# Patient Record
Sex: Female | Born: 1985 | Race: Black or African American | Hispanic: No | Marital: Married | Smoking: Never smoker
Health system: Southern US, Community
[De-identification: ages and names within clinical notes are randomized; demographics above are authoritative.]

## PROBLEM LIST (undated history)

## (undated) DIAGNOSIS — N83202 Unspecified ovarian cyst, left side: Secondary | ICD-10-CM

## (undated) DIAGNOSIS — O139 Gestational [pregnancy-induced] hypertension without significant proteinuria, unspecified trimester: Secondary | ICD-10-CM

## (undated) DIAGNOSIS — D219 Benign neoplasm of connective and other soft tissue, unspecified: Secondary | ICD-10-CM

## (undated) DIAGNOSIS — T7840XA Allergy, unspecified, initial encounter: Secondary | ICD-10-CM

## (undated) DIAGNOSIS — N83201 Unspecified ovarian cyst, right side: Secondary | ICD-10-CM

## (undated) DIAGNOSIS — L508 Other urticaria: Secondary | ICD-10-CM

## (undated) DIAGNOSIS — J40 Bronchitis, not specified as acute or chronic: Secondary | ICD-10-CM

## (undated) DIAGNOSIS — E559 Vitamin D deficiency, unspecified: Secondary | ICD-10-CM

## (undated) HISTORY — DX: Unspecified ovarian cyst, right side: N83.202

## (undated) HISTORY — DX: Unspecified ovarian cyst, right side: N83.201

## (undated) HISTORY — DX: Allergy, unspecified, initial encounter: T78.40XA

## (undated) HISTORY — DX: Benign neoplasm of connective and other soft tissue, unspecified: D21.9

## (undated) HISTORY — DX: Vitamin D deficiency, unspecified: E55.9

## (undated) HISTORY — PX: IUD REMOVAL: SHX5392

## (undated) HISTORY — DX: Gestational (pregnancy-induced) hypertension without significant proteinuria, unspecified trimester: O13.9

## (undated) HISTORY — DX: Bronchitis, not specified as acute or chronic: J40

## (undated) HISTORY — PX: TONSILLECTOMY: SUR1361

---

## 2008-05-01 ENCOUNTER — Emergency Department (HOSPITAL_COMMUNITY): Admission: EM | Admit: 2008-05-01 | Discharge: 2008-05-01 | Payer: Self-pay | Admitting: Family Medicine

## 2008-10-21 HISTORY — PX: DILATION AND CURETTAGE OF UTERUS: SHX78

## 2009-08-23 ENCOUNTER — Ambulatory Visit: Payer: Self-pay

## 2009-08-24 ENCOUNTER — Ambulatory Visit: Payer: Self-pay

## 2010-06-04 ENCOUNTER — Emergency Department: Payer: Self-pay | Admitting: Unknown Physician Specialty

## 2010-10-24 ENCOUNTER — Observation Stay: Payer: Self-pay

## 2010-10-25 ENCOUNTER — Observation Stay: Payer: Self-pay | Admitting: Obstetrics and Gynecology

## 2010-10-26 ENCOUNTER — Inpatient Hospital Stay: Payer: Self-pay

## 2011-07-18 LAB — POCT URINALYSIS DIP (DEVICE)
Bilirubin Urine: NEGATIVE
Glucose, UA: NEGATIVE
Ketones, ur: NEGATIVE
Operator id: 235561
Protein, ur: 100 — AB

## 2011-07-18 LAB — POCT PREGNANCY, URINE: Operator id: 235561

## 2014-02-13 ENCOUNTER — Inpatient Hospital Stay: Payer: Self-pay

## 2014-02-13 LAB — CBC WITH DIFFERENTIAL/PLATELET
BASOS ABS: 0 10*3/uL (ref 0.0–0.1)
BASOS PCT: 0.5 %
EOS ABS: 0.3 10*3/uL (ref 0.0–0.7)
EOS PCT: 2.9 %
HCT: 39.5 % (ref 35.0–47.0)
HGB: 13.4 g/dL (ref 12.0–16.0)
LYMPHS ABS: 2.2 10*3/uL (ref 1.0–3.6)
Lymphocyte %: 24 %
MCH: 29.7 pg (ref 26.0–34.0)
MCHC: 33.8 g/dL (ref 32.0–36.0)
MCV: 88 fL (ref 80–100)
MONOS PCT: 6.8 %
Monocyte #: 0.6 x10 3/mm (ref 0.2–0.9)
NEUTROS ABS: 6.1 10*3/uL (ref 1.4–6.5)
Neutrophil %: 65.8 %
Platelet: 173 10*3/uL (ref 150–440)
RBC: 4.49 10*6/uL (ref 3.80–5.20)
RDW: 15 % — ABNORMAL HIGH (ref 11.5–14.5)
WBC: 9.3 10*3/uL (ref 3.6–11.0)

## 2014-02-15 LAB — HEMATOCRIT: HCT: 35.3 % (ref 35.0–47.0)

## 2014-08-04 ENCOUNTER — Ambulatory Visit: Payer: Self-pay | Admitting: Surgery

## 2015-02-28 ENCOUNTER — Other Ambulatory Visit
Admission: RE | Admit: 2015-02-28 | Discharge: 2015-02-28 | Disposition: A | Discharge status: Home / Self Care | Source: Ambulatory Visit | Attending: Urgent Care | Admitting: Urgent Care

## 2015-02-28 DIAGNOSIS — R109 Unspecified abdominal pain: Secondary | ICD-10-CM | POA: Insufficient documentation

## 2015-02-28 DIAGNOSIS — K5900 Constipation, unspecified: Secondary | ICD-10-CM | POA: Insufficient documentation

## 2015-02-28 LAB — COMPREHENSIVE METABOLIC PANEL
ALT: 14 U/L (ref 14–54)
AST: 18 U/L (ref 15–41)
Albumin: 4.4 g/dL (ref 3.5–5.0)
Alkaline Phosphatase: 71 U/L (ref 38–126)
Anion gap: 5 (ref 5–15)
BUN: 13 mg/dL (ref 6–20)
CHLORIDE: 106 mmol/L (ref 101–111)
CO2: 26 mmol/L (ref 22–32)
CREATININE: 0.82 mg/dL (ref 0.44–1.00)
Calcium: 8.8 mg/dL — ABNORMAL LOW (ref 8.9–10.3)
Glucose, Bld: 92 mg/dL (ref 65–99)
POTASSIUM: 3.5 mmol/L (ref 3.5–5.1)
SODIUM: 137 mmol/L (ref 135–145)
Total Bilirubin: 0.5 mg/dL (ref 0.3–1.2)
Total Protein: 7.5 g/dL (ref 6.5–8.1)

## 2015-02-28 LAB — CBC WITH DIFFERENTIAL/PLATELET
Basophils Absolute: 0.1 10*3/uL (ref 0–0.1)
Basophils Relative: 1 %
EOS PCT: 2 %
Eosinophils Absolute: 0.2 10*3/uL (ref 0–0.7)
HEMATOCRIT: 38.6 % (ref 35.0–47.0)
HEMOGLOBIN: 12.5 g/dL (ref 12.0–16.0)
LYMPHS PCT: 47 %
Lymphs Abs: 3.4 10*3/uL (ref 1.0–3.6)
MCH: 28.5 pg (ref 26.0–34.0)
MCHC: 32.4 g/dL (ref 32.0–36.0)
MCV: 87.8 fL (ref 80.0–100.0)
MONO ABS: 0.4 10*3/uL (ref 0.2–0.9)
MONOS PCT: 6 %
NEUTROS ABS: 3.2 10*3/uL (ref 1.4–6.5)
Neutrophils Relative %: 44 %
Platelets: 252 10*3/uL (ref 150–440)
RBC: 4.4 MIL/uL (ref 3.80–5.20)
RDW: 13.9 % (ref 11.5–14.5)
WBC: 7.3 10*3/uL (ref 3.6–11.0)

## 2015-02-28 LAB — TSH: TSH: 1.614 u[IU]/mL (ref 0.350–4.500)

## 2015-02-28 LAB — LIPASE, BLOOD: LIPASE: 28 U/L (ref 22–51)

## 2015-03-01 LAB — IGA: IGA: 101 mg/dL (ref 87–352)

## 2015-03-01 LAB — MISC LABCORP TEST (SEND OUT): LABCORP TEST CODE: 4416

## 2015-03-01 LAB — TISSUE TRANSGLUTAMINASE, IGA: Tissue Transglutaminase Ab, IgA: <2 U/mL (ref 0–3)

## 2015-03-29 ENCOUNTER — Encounter: Payer: Self-pay | Admitting: Physical Therapy

## 2015-03-29 ENCOUNTER — Ambulatory Visit: Attending: Urgent Care | Admitting: Physical Therapy

## 2015-03-29 DIAGNOSIS — R279 Unspecified lack of coordination: Secondary | ICD-10-CM

## 2015-03-29 DIAGNOSIS — M629 Disorder of muscle, unspecified: Secondary | ICD-10-CM | POA: Diagnosis present

## 2015-03-30 NOTE — Patient Instructions (Signed)
      You are now ready to begin training the deep core muscles system: diaphragm, transverse abdominis, pelvic floor . These muscles must work together as a team.           The key to these exercises to train the brain to coordinate the timing of these muscles and to have them turn on for long periods of time to hold you upright against gravity (especially important if you are on your feet all day).These muscles are postural muscles and play a role stabilizing your spine and bodyweight. By doing these repetitions slowly and correctly instead of doing crunches, you will achieve a flatter belly without a lower pooch. You are also placing your spine in a more neutral position and breathing properly which in turn, decreases your risk for problems related to your pelvic floor, abdominal, and low back such as pelvic organ prolapse, hernias, diastasis recti (separation of superficial muscles), disk herniations, spinal fractures. These exercises set a solid foundation for you to later progress to resistance/ strength training with therabands and weights and return to other typical fitness exercises with a stronger deeper core.   Do only Dyn 1-2 this week

## 2015-03-30 NOTE — Therapy (Signed)
[place] MAIN [facility] SERVICES [address] [place], [place], [address] Phone: [phone]   Fax:  [phone]  Physical Therapy Evaluation  Patient Details  Name: Morgan Boyd MRN: [id number] Date of Birth: 1986-02-03 Referring Provider:  Andria Meuse, NP  Encounter Date: 03/29/2015      PT End of Session - 03/30/15 1027    Visit Number 1   Number of Visits 12   Date for PT Re-Evaluation 06/21/15   PT Start Time 1600   PT Stop Time 1517   PT Time Calculation (min) 75 min   Activity Tolerance Patient tolerated treatment well   Behavior During Therapy [facility] for tasks assessed/performed      Past Medical History  Diagnosis Date  . Allergy   . Bronchitis     Past Surgical History  Procedure Laterality Date  . Dilation and curettage of uterus  2010  . Tonsillectomy      There were no vitals filed for this visit.  Visit Diagnosis:  Fascial defect - Plan: PT plan of care cert/re-cert  Lack of coordination - Plan: PT plan of care cert/re-cert      Subjective Assessment - 03/29/15 1618    Subjective Pt noticed abdominal weakness immediately after returning home after delivery 4/ 2015, with tenderness to touch, being able to visually see movement of intestines,  constipation/ diarrhea flunctuatons, and abdominal cramping with forward bending, and urinary leakage with coughing, sneezing, laughing. Pt has difficulty laying on her stomach due to discomfort and also holding and picking up baby with compensatory use of other muscles and back pain increases.  Pt has pain with intercourse (sharp pains during and then aching and throbbing pain afterwards lingering a few hours). Pt also feel  as something is lowered after having sexual intercourse.    Pertinent History Hx of 2 vaginal deliveries, natural tearing with both children (1-2nd degree tears). Pt used to dance Editor, commissioning, jazz), teach dance fitness classes, [employer], running 3 mi 3x  week  prior to pregnancy. Performed bootcamp and weight training (arms 30#), and crunches (25-100 / 2x/week for 8 months)   Patient Stated Goals 1) be able to return to dance and fitness classes 2) return to running 3x/week  3) hold baby without leaning backward  4) elimkinate LBP 5) decreased pain with intercourse   Currently in Pain? Yes   Pain Score 5    Pain Location Back   Pain Orientation Lower   Pain Descriptors / Indicators Aching;Dull   Pain Type Chronic pain   Pain Onset More than a month ago   Pain Frequency Other (Comment)   Aggravating Factors  sitting, lifting             [facility] PT Assessment - 03/30/15 1101    Assessment   Medical Diagnosis constipation, diarrhea   Precautions   Precautions None   Restrictions   Weight Bearing Restrictions No   Prior Function   Level of Independence Independent   Observation/Other Assessments   Observations abdominal straining w/ cue for bowel movement,   Other Surveys  --  Pelvic Girdle Questionnaire 75% (lower % indicate improved f   Coordination   Gross Motor Movements are Fluid and Coordinated Yes   Fine Motor Movements are Fluid and Coordinated --  ALSR with lumbopelvic instability   AROM   Overall AROM  Within functional limits for tasks performed   Strength   Overall Strength Deficits   Overall Strength Comments Hip  Abd 3-/5 L, 3/5 R. 4/5 bilaterally with cue for deep core activation    Palpation   Palpation comment rib flare, diaphragm elevated   Bed Mobility   Bed Mobility Supine to Sit   log roll                   [facility] Adult PT Treatment/Exercise - 03/29/15 1633    Exercises   Exercises Lumbar   Other Exercises  Dynamic Stabilization 1-2   Knee/Hip Exercises: Standing   Other Standing Knee Exercises Postural education: pelvic neutral and deep core activation   Knee/Hip Exercises: Seated   Other Seated Knee Exercises Postural education w/ dee cpre ativation   Manual Therapy   Manual Therapy  Myofascial release  quadriped and plantigrade (abdomen(upper)   Myofascial Release Pre-Tx: 3 fingers above umbilicus, Post-Tx: 2 fingers                PT Education - 03/30/15 1027    Education provided Yes   Education Details HEP, POC, anatomy/physiology, goals, modifications to her exercise routine, answered pt's questions   Person(s) Educated Patient   Methods Explanation;Demonstration;Tactile cues;Verbal cues;Handout   Comprehension Verbalized understanding;Returned demonstration             PT Long Term Goals - 03/30/15 1045    PT LONG TERM GOAL #1   Title Pt will score a decreased % on PGQ from 75% to < 50% in order to improve QOL and return to ADLs.   Time 12   Period Weeks   Status New   PT LONG TERM GOAL #2   Title Pt will report having regular bowel movements with Stool Type 3-4 for 3-5x/ week for 1 week in order to demo improved bowel function.   Time 12   Period Weeks   Status New   PT LONG TERM GOAL #3   Title Pt will report no lingering pain after intercourse in order to demo improved pelvic floor function and improve QOL.    Time 12   Period Weeks   Status New   PT LONG TERM GOAL #4   Title Pt will demo decreased abdominal separation of < 2 fingers above and below umbilicus and demo proper activation of deep core mm with Dynamic Stabilization exercises 1-4 without cuing in order to return fitness exercises with minimized risk for injuries.    Time 12   Period Weeks   Status New               Plan - 03/30/15 1029    Clinical Impression Statement Pt is a 29 yo post-partum (29 yo) female whose S & Sx include poor fascial tensigrity over abdomen with abdominal separation, poor posture and body mechanics, decreased deep core strength and hip strength, and  poor deep core coordination w/ bowel movement cues. These deficits along with her Hx of performing exercises that poses strain on the pelvic floor/abdominal mm and multiple pregnancies have  likely contributed to her Sx of LBP, bowel, urinary, and sexual dysfunctions. These deficits limit her from  lifting her baby, having sex and  regular bowel movements in addition to having  no  urinary leakage.    Pt will benefit from skilled therapeutic intervention in order to improve on the following deficits Abnormal gait;Decreased coordination;Decreased balance;Decreased activity tolerance;Decreased endurance;Postural dysfunction;Improper body mechanics;Decreased range of motion;Decreased mobility;Other (comment);Pain;Obesity;Hypomobility;Decreased safety awareness   Rehab Potential Good   Clinical Impairments Affecting Rehab Potential Hx of pregnancies, performing excessive crunches, running  after pregnancy   PT Frequency 1x / week   PT Duration 12 weeks   PT Treatment/Interventions ADLs/Self Care Home Management;Aquatic Therapy;Neuromuscular re-education;Gait training;Patient/family education;Moist Heat;Cryotherapy;Functional mobility training;Therapeutic activities;Balance training;Therapeutic exercise;Manual techniques;Energy conservation;Dry needling   PT Next Visit Plan internal pelvic floor assessment    Consulted and Agree with Plan of Care Patient         Problem List There are no active problems to display for this patient.   Jerl Mina ,PT, DPT, E-RYT  03/30/2015, 11:01 AM  [place] MAIN [facility] SERVICES [address] [place], [place], [address] Phone: [phone]   Fax:  [phone]

## 2015-04-03 ENCOUNTER — Ambulatory Visit: Admitting: Physical Therapy

## 2015-04-03 DIAGNOSIS — M629 Disorder of muscle, unspecified: Secondary | ICD-10-CM | POA: Diagnosis not present

## 2015-04-03 DIAGNOSIS — R279 Unspecified lack of coordination: Secondary | ICD-10-CM

## 2015-04-03 NOTE — Patient Instructions (Signed)
PELVIC FLOOR / KEGEL EXERCISES   Pelvic floor/ Kegel exercises are used to strengthen the muscles in the base of your pelvis that are responsible for supporting your pelvic organs and preventing urine/feces leakage. Based on your therapist's recommendations, they can be performed while standing, sitting, or lying down. Imagine pelvic floor area as a diamond with pelvic landmarks: top =pubic bone, bottom tip=tailbone, sides=sitting bones (ischial tuberosities).    Make yourself aware of this muscle group by using these cues while coordinating your breath:  Inhale, feel pelvic floor diamond area lower like hammock towards your feet and ribcage/belly expanding. Pause. Let the exhale naturally and feel your belly sink, abdominal muscles hugging in around you and you may notice the pelvic diamond draws upward towards your head forming a umbrella shape. Give a squeeze during the exhalation like you are stopping the flow of urine. If you are squeezing the buttock muscles, try to give 50% less effort.   Common Errors:  Breath holding: If you are holding your breath, you may be bearing down against your bladder instead of pulling it up. If you belly bulges up while you are squeezing, you are holding your breath. Be sure to breathe gently in and out while exercising. Counting out loud may help you avoid holding your breath.  Accessory muscle use: You should not see or feel other muscle movement when performing pelvic floor exercises. When done properly, no one can tell that you are performing the exercises. Keep the buttocks, belly and inner thighs relaxed.  Overdoing it: Your muscles can fatigue and stop working for you if you over-exercise. You may actually leak more or feel soreness at the lower abdomen or rectum.  YOUR HOME EXERCISE PROGRAM  LONG HOLDS: Position: on back w/ pillow propped under hips  Inhale and then exhale. Then squeeze the muscle and count aloud for 10 seconds. Rest with three long  breaths. (Be sure to let belly sink in with exhales and not push outward)  Perform 4 repetitions, 3 times/day  SHORT HOLDS: Position: on back, sitting   Inhale and then exhale. Then squeeze the muscle.  (Be sure to let belly sink in with exhales and not push outward)  Perform after each rep of dynamic stabilization Level 1, steated                       DECREASE DOWNWARD PRESSURE ON  YOUR PELVIC FLOOR, ABDOMINAL, LOW BACK MUSCLES       PRESERVE YOUR PELVIC HEALTH LONG-TERM   ** SQUEEZE pelvic floor BEFORE YOUR SNEEZE, COUGH, LAUGH   ** EXHALE BEFORE YOU RISE AGAINST GRAVITY (lifting, sit to stand, from squat to stand)  ___WIDER FEET< USE LEG STENGTH AND NO FLIP FLOPS   ** LOG ROLL OUT OF BED INSTEAD OF CRUNCH/SIT-UP

## 2015-04-03 NOTE — Therapy (Signed)
[place] MAIN [facility] SERVICES [address] [place], [place], [address] Phone: [phone]   Fax:  [phone]  Physical Therapy Treatment  Patient Details  Name: Morgan Boyd MRN: [id number] Date of Birth: 10-Dec-1985 Referring Provider:  Andria Meuse, NP  Encounter Date: 04/03/2015      PT End of Session - 04/03/15 2200    Visit Number 2   Number of Visits 12   Date for PT Re-Evaluation 06/21/15   PT Start Time 7824   PT Stop Time 1615   PT Time Calculation (min) 60 min   Activity Tolerance Patient tolerated treatment well   Behavior During Therapy [facility] for tasks assessed/performed      Past Medical History  Diagnosis Date  . Allergy   . Bronchitis     Past Surgical History  Procedure Laterality Date  . Dilation and curettage of uterus  2010  . Tonsillectomy      There were no vitals filed for this visit.  Visit Diagnosis:  Fascial defect  Lack of coordination      Subjective Assessment - 04/03/15 1518    Subjective Pt reported she has been lifting items because her family is moving to a new home. Pt has been practicing proper sitting posture at work.    Pertinent History Hx of 2 vaginal deliveries, natural tearing with both children (1-2nd degree tears). Pt used to dance Editor, commissioning, jazz), teach dance fitness classes, [employer], running 3 mi 3x week  prior to pregnancy. Performed bootcamp and weight training (arms 30#), and crunches (25-100 / 2x/week for 8 months)   Patient Stated Goals 1) be able to return to dance and fitness classes 2) return to running 3x/week  3) hold baby without leaning backward  4) elimkinate LBP 5) decreased pain with intercourse   Currently in Pain? Yes   Pain Score 5    Pain Location Back   Pain Descriptors / Indicators Aching;Dull   Pain Onset More than a month ago   Pain Frequency Other (Comment)   Aggravating Factors  sitting for long periods and picking up children                       Pelvic Floor Special Questions - 04/03/15 1637    Pelvic Floor Internal Exam consented verbally and no contraindications    Exam Type Vaginal   Palpation bladder positioned dorsally. Pillow under hips decreased palpation to Obt Int L   tenderness/ increased tensions bil pubococcygeus/ obt int.   Strength fair squeeze, definite lift  after manual Tx, increased from 2/5 (posterior> ant) to 3/5   Strength # of reps 4  L decreased activation > R   Strength # of seconds 10           [facility] Adult PT Treatment/Exercise - 04/03/15 1631    Bed Mobility   Bed Mobility Sit to Supine   log roll   Sit to Supine Other (comment)  cues: sit > sidelying> supine, minimize abd strain   Manual Therapy   Manual Therapy Soft tissue mobilization;Myofascial release   Manual therapy comments abdominal massage   guided pt to perform self massage   Myofascial Release Pre-Tx: 2.5 fingers above umbilicus, Post-Tx: 2 fingers  below sternum: 3>2 fingers post-tactile cues for ribcage dep                PT Education - 04/03/15 2159    Education provided Yes  Education Details HEP   Person(s) Educated Patient   Methods Explanation;Demonstration;Tactile cues;Verbal cues;Handout   Comprehension Verbalized understanding;Returned demonstration             PT Long Term Goals - 03/30/15 1045    PT LONG TERM GOAL #1   Title Pt will score a decreased % on PGQ from 75% to < 50% in order to improve QOL and return to ADLs.   Time 12   Period Weeks   Status New   PT LONG TERM GOAL #2   Title Pt will report having regular bowel movements with Stool Type 3-4 for 3-5x/ week for 1 week in order to demo improved bowel function.   Time 12   Period Weeks   Status New   PT LONG TERM GOAL #3   Title Pt will report no lingering pain after intercourse in order to demo improved pelvic floor function and improve QOL.    Time 12   Period Weeks   Status New   PT LONG TERM  GOAL #4   Title Pt will demo decreased abdominal separation of < 2 fingers above and below umbilicus and demo proper activation of deep core mm with Dynamic Stabilization exercises 1-4 without cuing in order to return fitness exercises with minimized risk for injuries.    Time 12   Period Weeks   Status New               Plan - 04/03/15 2200    Clinical Impression Statement Pt continued to show improvement of abdominal closure post-manual Tx with addition of neuromuscular cuing for ribcage depression. Initiated pelvic floor strenghtening as her internal assessment showed weakness w/ slight dorsal descent of bladder within introitus and increased mm tensions. Positioning and manual Tx faciliated increased pelvic floor contraction. Also initiated abdominal massage.  Pt will continue to benefit from skilled PT.     Pt will benefit from skilled therapeutic intervention in order to improve on the following deficits Abnormal gait;Decreased coordination;Decreased balance;Decreased activity tolerance;Decreased endurance;Postural dysfunction;Improper body mechanics;Decreased range of motion;Decreased mobility;Other (comment);Pain;Obesity;Hypomobility;Decreased safety awareness   Rehab Potential Good   Clinical Impairments Affecting Rehab Potential Hx of pregnancies, performing excessive crunches, running after pregnancy   PT Frequency 1x / week   PT Duration 12 weeks   PT Treatment/Interventions ADLs/Self Care Home Management;Aquatic Therapy;Neuromuscular re-education;Gait training;Patient/family education;Moist Heat;Cryotherapy;Functional mobility training;Therapeutic activities;Balance training;Therapeutic exercise;Manual techniques;Energy conservation;Dry needling   Consulted and Agree with Plan of Care Patient        Problem List There are no active problems to display for this patient.   Jerl Mina ,PT, DPT, E-RYT  04/03/2015, 10:04 PM  [place] MAIN [facility] SERVICES [address] [place], [place], [address] Phone: [phone]   Fax:  [phone]

## 2015-04-10 ENCOUNTER — Ambulatory Visit: Admitting: Physical Therapy

## 2015-04-10 DIAGNOSIS — M629 Disorder of muscle, unspecified: Secondary | ICD-10-CM

## 2015-04-10 DIAGNOSIS — R279 Unspecified lack of coordination: Secondary | ICD-10-CM

## 2015-04-10 NOTE — Therapy (Signed)
[place] MAIN [facility] SERVICES [address] [place], [place], [address] Phone: [phone]   Fax:  [phone]  Physical Therapy Treatment  Patient Details  Name: Morgan Boyd MRN: [id number] Date of Birth: January 19, 1986 Referring Provider:  Andria Meuse, NP  Encounter Date: 04/10/2015      PT End of Session - 04/10/15 1558    Visit Number 3   Number of Visits 12   Date for PT Re-Evaluation 06/21/15   PT Start Time 9485   PT Stop Time 1559   PT Time Calculation (min) 52 min   Activity Tolerance Patient tolerated treatment well   Behavior During Therapy [facility] for tasks assessed/performed      Past Medical History  Diagnosis Date  . Allergy   . Bronchitis     Past Surgical History  Procedure Laterality Date  . Dilation and curettage of uterus  2010  . Tonsillectomy      There were no vitals filed for this visit.  Visit Diagnosis:  Fascial defect  Lack of coordination      Subjective Assessment - 04/10/15 1530    Subjective Pt reports her constipation/diarrhea Sx have improved 50%. Pt reported she is more conscious with lifting on exhale but she still feel pain in her lower back when holding her son (26#). Pt found SIJ belt was helpful during lifting and moving boxes. This morning she woke up with 8/10 due to an awkward sleeping position but pain has decreased to 4/10 as the today progressed.   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 Pt has had an IUD inserted since her last session and had not significant complaints.     Patient is accompained by: --  Pt was not accompanied by family member at last session 04/03/15    Pertinent History Hx of 2 vaginal deliveries, natural tearing with both children (1-2nd degree tears). Pt used to dance Editor, commissioning,  jazz), teach dance fitness classes, [employer], running 3 mi 3x week  prior to pregnancy. Performed bootcamp and weight training (arms 30#), and crunches (25-100 / 2x/week for 8 months)   Patient Stated Goals 1) be able to return to dance and fitness classes 2) return to running 3x/week  3) hold baby without leaning backward  4) eliminate LBP 5) decreased    Currently in Pain? Yes   Pain Score 4    Pain Location Back   Pain Orientation Lower   Pain Type Chronic pain   Pain Onset More than a month ago   Pain Frequency Constant                         [facility] Adult PT Treatment/Exercise - 04/10/15 1518    Posture/Postural Control   Posture Comments Dynamic Stabilization 3-4 (5 reps)    Exercises   Exercises Ankle   Lumbar Exercises: Standing   Functional Squats 10 reps  neuro-reedu: tranverse arch  and pelivc neutral engaged   Lifting 10 reps  with squats 10# dumbbell    Manual Therapy   Manual Therapy Joint mobilization   Manual therapy comments Noted decreased mobility at T4   increased mm tension midback mm bilaterally   Joint Mobilization Grade IIII PA on T4   increased mobility at T4 and decreased mm tension post_Tx   Ankle  Exercises: Standing   Other Standing Ankle Exercises double heel raises w/ neuro reedu on slow descend, transverse arch engaged  pelvic neural position maintained                 PT Education - 04/10/15 1557    Education provided Yes   Education Details HEP   Person(s) Educated Patient   Methods Explanation;Demonstration;Tactile cues;Verbal cues;Handout   Comprehension Verbalized understanding;Returned demonstration             PT Long Term Goals - 04/10/15 1535    PT LONG TERM GOAL #1   Title Pt will score a decreased % on PGQ from 75% to < 50% in order to improve QOL and return to ADLs.   Time 12   Period Weeks   Status New   PT LONG TERM GOAL #2   Title Pt will report having regular bowel movements with Stool Type  3-4 for 3-5x/ week for 1 week in order to demo improved bowel function.   Time 12   Period Weeks   Status Achieved   PT LONG TERM GOAL #3   Title Pt will report no lingering pain after intercourse in order to demo improved pelvic floor function and improve QOL.    Time 12   Period Weeks   Status On-going   PT LONG TERM GOAL #4   Title Pt will demo decreased abdominal separation of < 2 fingers above and below umbilicus and demo proper activation of deep core mm with Dynamic Stabilization exercises 1-4 without cuing in order to return fitness exercises with minimized risk for injuries.    Time 12   Period Weeks   Status Achieved               Plan - 04/10/15 1601    Clinical Impression Statement Pt has achieved 2 goals with increased abdominal closure and significantly improved constipation/diarrhea Sx and abdominal pain. Pt showed good carry over with proper sitting posture but required minor cuing for pelvic neutral in standing. Pt continues to show deficits in lower kinetic chain with collapsed arches and hypomobile upper thoracic spine segments. Post-Tx: pt showed increased mobility in T-spine, increased awareness to evert feet in functional exercises to buiild up her arches. Will reassess intravaginally to address dyspaeunia goals at next session.  Progressed to dynamic stabilization 3-4 today.    Pt will benefit from skilled therapeutic intervention in order to improve on the following deficits Abnormal gait;Decreased coordination;Decreased balance;Decreased activity tolerance;Decreased endurance;Postural dysfunction;Improper body mechanics;Decreased range of motion;Decreased mobility;Other (comment);Pain;Obesity;Hypomobility;Decreased safety awareness   Rehab Potential Good   Clinical Impairments Affecting Rehab Potential Hx of pregnancies, performing excessive crunches, running after pregnancy   PT Frequency 1x / week   PT Duration 12 weeks   PT Treatment/Interventions ADLs/Self  Care Home Management;Aquatic Therapy;Neuromuscular re-education;Gait training;Patient/family education;Moist Heat;Cryotherapy;Functional mobility training;Therapeutic activities;Balance training;Therapeutic exercise;Manual techniques;Energy conservation;Dry needling   PT Next Visit Plan internal pelvic floor reassessment    Consulted and Agree with Plan of Care Patient        Problem List There are no active problems to display for this patient.   Jerl Mina ,PT, DPT, E-RYT  04/10/2015, 4:12 PM  [place] MAIN [facility] SERVICES [address] [place], [place], [address] Phone: [phone]   Fax:  [phone]

## 2015-04-10 NOTE — Patient Instructions (Addendum)
1. Double Heel Raises 2. Squat and Squat w/ 10 # dumbbell  3. chair pose with attention not collapsing longitidunal arch (4 corners of the feet) 10 reps   4. Dynamic Stabilization 3-4, 20-30 reps/2x day 5. Progress pelvic floor to 4 reps (pillow propped)     6. Standing posture from the feet arch lifted)  7. Get shoes with proper sole support  (minimal time in flip flops)

## 2015-04-17 ENCOUNTER — Encounter: Admitting: Physical Therapy

## 2015-04-20 ENCOUNTER — Encounter: Payer: Self-pay | Admitting: Surgery

## 2015-04-20 ENCOUNTER — Ambulatory Visit (INDEPENDENT_AMBULATORY_CARE_PROVIDER_SITE_OTHER): Admitting: Surgery

## 2015-04-20 ENCOUNTER — Ambulatory Visit: Admitting: Physical Therapy

## 2015-04-20 VITALS — BP 119/82 | HR 68 | Temp 98.3°F | Ht 63.0 in | Wt 183.0 lb

## 2015-04-20 DIAGNOSIS — M629 Disorder of muscle, unspecified: Secondary | ICD-10-CM | POA: Diagnosis not present

## 2015-04-20 DIAGNOSIS — R1013 Epigastric pain: Secondary | ICD-10-CM | POA: Diagnosis not present

## 2015-04-20 DIAGNOSIS — R279 Unspecified lack of coordination: Secondary | ICD-10-CM

## 2015-04-20 NOTE — Progress Notes (Signed)
Surgery Progress note  S: Doing well.  Tolerating diet.  No abdominal pain.  Undergoing pelvic floor PT Blood pressure 119/82, pulse 68, temperature 98.3 F (36.8 C), temperature source Oral, height 5\' 3"  (1.6 m), weight 183 lb (83.008 kg). GEN: NAD/A&Ox3 ABD: soft, min tender, nondistended  A/P 29 yo admit with gallstones, diarrhea/constipation.  Seen GI and suggested diet change and pelvic floor PT.  Doing better from all aspects.  No indication for surgery.  F/u prn.

## 2015-04-20 NOTE — Therapy (Signed)
[place] MAIN [facility] SERVICES [address] [place], [place], [address] Phone: [phone]   Fax:  [phone]  Physical Therapy Treatment  Patient Details  Name: Morgan Boyd MRN: [id number] Date of Birth: 06/25/1986 Referring Provider:  Andria Meuse, NP  Encounter Date: 04/20/2015      PT End of Session - 04/20/15 1830    Visit Number 4   Number of Visits 12   Date for PT Re-Evaluation 06/21/15   Activity Tolerance Patient tolerated treatment well   Behavior During Therapy [facility] for tasks assessed/performed      Past Medical History  Diagnosis Date  . Allergy   . Bronchitis     Past Surgical History  Procedure Laterality Date  . Dilation and curettage of uterus  2010  . Tonsillectomy      There were no vitals filed for this visit.  Visit Diagnosis:  Fascial defect  Lack of coordination      Subjective Assessment - 04/20/15 1531    Subjective Pt reported she feels like she is getting stronger.  Pt reported her constipation/ diarhea Sx are the same. Pt has become more conscious with picking up son and moving items with exhalation and has noticed a difference in decreasing her back pain .  Pt has not been doing her abdominal massage.     Patient is accompained by: --  Pt was not accompanied by family member at last session 04/03/15    Pertinent History Hx of 2 vaginal deliveries, natural tearing with both children (1-2nd degree tears). Pt used to dance Editor, commissioning, jazz), teach dance fitness classes, [employer], running 3 mi 3x week  prior to pregnancy. Performed bootcamp and weight training (arms 30#), and crunches (25-100 / 2x/week for 8 months)   Patient Stated Goals 1) be able to return to dance and fitness classes 2) return to running 3x/week  3) hold baby without leaning backward  4) eliminate LBP 5) decreased    Currently in Pain? Yes   Pain Score 0-No pain   Pain Onset More than a month ago                       Pelvic Floor Special Questions - 04/20/15 0001    Diastasis Recti 1.5 below sternum, and above umbilicus, closure below umbilicus  compared to 3 fingers width at eval   Strength good squeeze, good lift, able to hold agaisnt strong resistance  without pillow           [facility] Adult PT Treatment/Exercise - 04/20/15 1538    Exercises   Exercises --   Other Exercises  ankle pumps SLS, crab crawling, bridging, (ex with baby),  seated clams, monstrer walking, triceps, birddog (at work) 10 reps, pelvic floor increase reps to 5-6 reps 3x/day                PT Education - 04/20/15 [place]    Education provided Yes   Education Details HEP   Person(s) Educated Patient   Methods Explanation;Demonstration;Tactile cues;Verbal cues;Handout   Comprehension Verbalized understanding;Returned demonstration             PT Long Term Goals - 04/20/15 1834    PT LONG TERM GOAL #1   Title Pt will score a decreased % on PGQ from 75% to < 50% in order to improve QOL and return to ADLs.   Time 12   Period Weeks   Status On-going  PT LONG TERM GOAL #2   Title Pt will report having regular bowel movements with Stool Type 3-4 for 3-5x/ week for 1 week in order to demo improved bowel function.   Time 12   Period Weeks   Status Achieved   PT LONG TERM GOAL #3   Title Pt will report no lingering pain after intercourse in order to demo improved pelvic floor function and improve QOL.    Time 12   Period Weeks   Status On-going   PT LONG TERM GOAL #4   Title Pt will demo decreased abdominal separation of < 2 fingers above and below umbilicus and demo proper activation of deep core mm with Dynamic Stabilization exercises 1-4 without cuing in order to return fitness exercises with minimized risk for injuries.    Time 12   Period Weeks   Status Achieved   PT LONG TERM GOAL #5   Title Pt will demo pelvic floor contraction 01/28/09 in order progress to running.     Time 12   Period Weeks   Status New               Plan - 04/20/15 1830    Clinical Impression Statement Pt continues to show good carry over with neuro-redu (feet and lifting mechanics). Pt presented to session without back pain and demo'd 1.5 fingers width below sternum and above umbilicus. Pt also demo'd  increased pelvic floor strength and increased fascial tensigrity as demo'd normal position of bladder and was able to elicit circumferential contraction with lift without pillow under hips.  Pt is progressing to higher functioning exercises to help pt return to running and remaining goals.    Pt will benefit from skilled therapeutic intervention in order to improve on the following deficits Abnormal gait;Decreased coordination;Decreased balance;Decreased activity tolerance;Decreased endurance;Postural dysfunction;Improper body mechanics;Decreased range of motion;Decreased mobility;Other (comment);Pain;Obesity;Hypomobility;Decreased safety awareness   Rehab Potential Good   Clinical Impairments Affecting Rehab Potential Hx of pregnancies, performing excessive crunches, running after pregnancy   PT Frequency 1x / week   PT Duration 12 weeks   PT Treatment/Interventions ADLs/Self Care Home Management;Aquatic Therapy;Neuromuscular re-education;Gait training;Patient/family education;Moist Heat;Cryotherapy;Functional mobility training;Therapeutic activities;Balance training;Therapeutic exercise;Manual techniques;Energy conservation;Dry needling   PT Next Visit Plan running gait assessment, resistive band for thoracolumbar    Consulted and Agree with Plan of Care Patient        Problem List There are no active problems to display for this patient.   Jerl Mina ,PT, DPT, E-RYT  04/20/2015, 6:36 PM  [place] MAIN [facility] SERVICES [address] [place], [place], [address] Phone: [phone]   Fax:  [phone]

## 2015-04-20 NOTE — Patient Instructions (Addendum)
At work:  Seated clams w/ red band 10x/ 3 set/ days  Sidestepping with squats ( ribcage over pelvic)  A hallway L /R   Monster walking hallway 2 trips hallway   Triceps dips on chair (feet hipwidth apart)    At home:  Belly massage  Playing with Cristie Hem:      Constance Haw 10 x    Birddog  (all fours, opp arm and leg extended) 10x     Crab walking (press ballmounds of hands down)

## 2015-04-20 NOTE — Patient Instructions (Signed)
Give [place] a call if you need [place].

## 2015-04-25 ENCOUNTER — Ambulatory Visit: Attending: Urgent Care | Admitting: Physical Therapy

## 2015-04-25 DIAGNOSIS — M629 Disorder of muscle, unspecified: Secondary | ICD-10-CM | POA: Diagnosis present

## 2015-04-25 DIAGNOSIS — K5900 Constipation, unspecified: Secondary | ICD-10-CM | POA: Diagnosis not present

## 2015-04-25 DIAGNOSIS — R197 Diarrhea, unspecified: Secondary | ICD-10-CM | POA: Diagnosis not present

## 2015-04-25 DIAGNOSIS — R279 Unspecified lack of coordination: Secondary | ICD-10-CM

## 2015-04-26 NOTE — Therapy (Signed)
[place] MAIN [facility] SERVICES [address] [place], [place], [address] Phone: [phone]   Fax:  [phone]  Physical Therapy Treatment  Patient Details  Name: Morgan Boyd MRN: [id number] Date of Birth: 25-Dec-1985 Referring Provider:  Andria Meuse, NP  Encounter Date: 04/25/2015      PT End of Session - 04/26/15 0929    Visit Number 5   Number of Visits 12   Date for PT Re-Evaluation 06/21/15   PT Start Time 6962   PT Stop Time 1710   PT Time Calculation (min) 60 min   Activity Tolerance Patient tolerated treatment well   Behavior During Therapy [facility] for tasks assessed/performed      Past Medical History  Diagnosis Date  . Allergy   . Bronchitis     Past Surgical History  Procedure Laterality Date  . Dilation and curettage of uterus  2010  . Tonsillectomy      There were no vitals filed for this visit.  Visit Diagnosis:  Fascial defect  Lack of coordination      Subjective Assessment - 04/25/15 1611    Subjective Pt continues to not have back pain. Pt feels a abdominal cramp and then will go to the toilet and have no difficulty w/ bowel elimination. Pt has daily bowel movement.    Patient is accompained by:    Pertinent History Hx of 2 vaginal deliveries, natural tearing with both children (1-2nd degree tears). Pt used to dance Editor, commissioning, jazz), teach dance fitness classes, [employer], running 3 mi 3x week  prior to pregnancy. Performed bootcamp and weight training (arms 30#), and crunches (25-100 / 2x/week for 8 months)   Patient Stated Goals 1) be able to return to dance and fitness classes 2) return to running 3x/week  3) hold baby without leaning backward  4) eliminate LBP 5) decreased    Pain Onset More than a month ago                      Pelvic Floor Special Questions - 04/26/15 9528    Pelvic Floor Internal Exam consented verbally and no contraindications    Exam Type Vaginal   Palpation  bladder position normal, pillow not needed for contractions  no tensions/tenderness bil pubococcygeus/ obt int.   Strength good squeeze, good lift, able to hold agaisnt strong resistance   Strength # of reps 9   Strength # of seconds 10           [facility] Adult PT Treatment/Exercise - 04/26/15 0913    Transfers   Transfers --  neuro-re edu w/sexual intercourse positions, modification   Exercises   Exercises Lumbar   Other Exercises  D1Ext, D2 flex,bicep curls SLS, lat pull down    with Green band  10 reps   Lumbar Exercises: Aerobic   Tread Mill neuro-redu: deep core activation, forward COM, land lightly 9' without report of leaking  pt oringinally showed  heel striking                     PT Long Term Goals - 04/25/15 1614    PT LONG TERM GOAL #1   Title Pt will score a decreased % on PGQ from 75% to < 50% in order to improve QOL and return to ADLs.   Time 12   Period Weeks   Status On-going   PT LONG TERM GOAL #2   Title Pt will report  having regular bowel movements with Stool Type 3-4 for 3-5x/ week for 1 week in order to demo improved bowel function.   Time 12   Period Weeks   Status Achieved   PT LONG TERM GOAL #3   Title Pt will report no lingering pain after intercourse in order to demo improved pelvic floor function and improve QOL.    Time 12   Period Weeks   Status On-going   PT LONG TERM GOAL #4   Title Pt will demo decreased abdominal separation of < 2 fingers above and below umbilicus and demo proper activation of deep core mm with Dynamic Stabilization exercises 1-4 without cuing in order to return fitness exercises with minimized risk for injuries.    Time 12   Period Weeks   Status Achieved   PT LONG TERM GOAL #5   Title Pt will demo pelvic floor contraction 01/28/09 in order progress to running.    Time 12   Period Weeks   Status Partially Met               Plan - 04/26/15 0925    Clinical Impression Statement Pt progressed to  treadmill walking 5', jogging 5' without report of leakage. Pt required neuro-reedu for optimal deep core activation and gait training. Progressed HEP w/ green band PNF patterns and SLS to challlenge postural stability.  Pt continues to required skilled PT to continue addressing her goals.    Pt will benefit from skilled therapeutic intervention in order to improve on the following deficits Abnormal gait;Decreased coordination;Decreased balance;Decreased activity tolerance;Decreased endurance;Postural dysfunction;Improper body mechanics;Decreased range of motion;Decreased mobility;Other (comment);Pain;Obesity;Hypomobility;Decreased safety awareness   Rehab Potential Good   Clinical Impairments Affecting Rehab Potential Hx of pregnancies, performing excessive crunches, running after pregnancy   PT Frequency 1x / week   PT Duration 12 weeks   PT Treatment/Interventions ADLs/Self Care Home Management;Aquatic Therapy;Neuromuscular re-education;Gait training;Patient/family education;Moist Heat;Cryotherapy;Functional mobility training;Therapeutic activities;Balance training;Therapeutic exercise;Manual techniques;Energy conservation;Dry needling   PT Next Visit Plan running gait assessment, resistive band for thoracolumbar    Consulted and Agree with Plan of Care Patient        Problem List There are no active problems to display for this patient.   Jerl Mina ,PT, DPT, E-RYT  04/26/2015, 9:34 AM  [place] MAIN [facility] SERVICES [address] [place], [place], [address] Phone: [phone]   Fax:  [phone]

## 2015-05-01 ENCOUNTER — Ambulatory Visit: Admitting: Physical Therapy

## 2015-05-01 DIAGNOSIS — M629 Disorder of muscle, unspecified: Secondary | ICD-10-CM

## 2015-05-01 DIAGNOSIS — R279 Unspecified lack of coordination: Secondary | ICD-10-CM | POA: Insufficient documentation

## 2015-05-02 NOTE — Therapy (Signed)
[place] MAIN [facility] SERVICES [address] [place], [place], [address] Phone: [phone]   Fax:  [phone]  Physical Therapy Treatment  Patient Details  Name: Morgan Boyd MRN: [id number] Date of Birth: 1986/03/17 Referring Provider:  Andria Meuse, NP  Encounter Date: 05/01/2015    Past Medical History  Diagnosis Date  . Allergy   . Bronchitis     Past Surgical History  Procedure Laterality Date  . Dilation and curettage of uterus  2010  . Tonsillectomy      There were no vitals filed for this visit.  Visit Diagnosis:  Fascial defect  Lack of coordination      Subjective Assessment - 05/01/15 1515    Subjective Pt reported  she was able to jog for .5 mi but felt a burning sensation in her pelvic area post-jog as she had been holding her pelvic floor mm tight during jog to prevent leakage. She has been getting her HEP at work. Pt continues to do her abdominal massage and is paying attention to lifting and body mechanics with household activities and lifting her son. Pt has had to perform more lifting than usual like taking out the trash and moving furniture this week as her husband is out of town until the end of this month. Pt's back pain is 3/10 as a result of these increased tasks. Pt stated she noticed her belly looking better but continues to feel frustrated (appeared tearful) because she is used to being fit prior to pregnancy.     Patient is accompained by: --   Pertinent History Hx of 2 vaginal deliveries, natural tearing with both children (1-2nd degree tears). Pt used to dance Editor, commissioning, jazz), teach dance fitness classes, [employer], running 3 mi 3x week  prior to pregnancy. Performed bootcamp and weight training (arms 30#), and crunches (25-100 / 2x/week for 8 months)   Patient Stated Goals 1) be able to return to dance and fitness classes 2) return to running 3x/week  3) hold baby without leaning backward  4) eliminate  LBP 5) decreased    Currently in Pain? Yes   Pain Score 3    Pain Location Back   Pain Orientation Lower   Pain Descriptors / Indicators Aching;Dull   Pain Type Chronic pain   Pain Onset More than a month ago            [facility] PT Assessment - 05/02/15 1001    Palpation   Palpation comment improved ribcage depression in all positions/squat/ lifting                     [facility] Adult PT Treatment/Exercise - 05/02/15 1003    Self-Care   Other Self-Care Comments  body scan technique to decrease anxiety over self-image.   guided 2 cycles, emailed audio recording to pt   Therapeutic Activites    ADL's pushing furniture   cued/demo'd low wide squat, weight shifting BLE   Lifting demo'd standing far from object   cued to stand closer to obj prior to lifting   Other Therapeutic Activities demo'd forward flexion w/ t/f son out of carseat/ lifting stroller out of trunk  cued/demo to pt: semi lunge position, WBing BLE     Neuro Re-ed    Neuro Re-ed Details  decrease static onctraction of pelvic floor mm while jogging. focus on breathing,    Exercises   Other Exercises  progressed bridging to unilat LE birdge, demo'd good  lumbopelvic stability  5 reps   Manual Therapy   Manual Therapy Myofascial release  quadriped and plantigrade (abdomen(upper)   Myofascial Release Pre-Tx: 3 fingers above umbilicus, Post-Tx: 2 fingers  applied taping for support                     PT Long Term Goals - 05/02/15 0954    PT LONG TERM GOAL #1   Title Pt will score a decreased % on PGQ from 75% to < 50% in order to improve QOL and return to ADLs.   Time 12   Period Weeks   Status On-going   PT LONG TERM GOAL #2   Title Pt will report having regular bowel movements with Stool Type 3-4 for 3-5x/ week for 1 week in order to demo improved bowel function.   Time 12   Period Weeks   Status Achieved   PT LONG TERM GOAL #3   Title Pt will report no lingering pain after intercourse  in order to demo improved pelvic floor function and improve QOL.    Time 12   Period Weeks   Status On-going   PT LONG TERM GOAL #4   Title Pt will demo decreased abdominal separation of < 2 fingers above and below umbilicus and demo proper activation of deep core mm with Dynamic Stabilization exercises 1-4 without cuing in order to return fitness exercises with minimized risk for injuries.    Time 12   Period Weeks   Status Achieved   PT LONG TERM GOAL #5   Title Pt will demo pelvic floor contraction 01/28/09 in order progress to running.    Time 12   Period Weeks   Status Partially Met               Plan - 05/02/15 2234    Clinical Impression Statement Pt continues to progress well towards her goals with return to running without leakage and slight back pain with increased household activities the past week. Suspect pt's increased lifting with faulty body mechanics over the past week may be a factor in the separation of 3 fingers width above her umbilicus. Despite DRA, pt shows significantly improved propioception of spine and increased postural stability. Pt demo'd correct body mechanics with transferring baby out of car seat / lifting stroller as practiced by her car. Applied biopsychosocial approach to help pt cope with post-partum bodily changes.  Pt will continue to benefit from skilled PT w/ functional strengthening. Pt was asked to bring baby next session along with music and Zumba movements.         Pt will benefit from skilled therapeutic intervention in order to improve on the following deficits Abnormal gait;Decreased coordination;Decreased balance;Decreased activity tolerance;Decreased endurance;Postural dysfunction;Improper body mechanics;Decreased range of motion;Decreased mobility;Other (comment);Pain;Obesity;Hypomobility;Decreased safety awareness;Decreased cognition   Rehab Potential Good   Clinical Impairments Affecting Rehab Potential Hx of pregnancies, performing  excessive crunches, running after pregnancy   PT Frequency 1x / week   PT Duration 12 weeks   PT Treatment/Interventions ADLs/Self Care Home Management;Aquatic Therapy;Neuromuscular re-education;Gait training;Patient/family education;Moist Heat;Cryotherapy;Functional mobility training;Therapeutic activities;Balance training;Therapeutic exercise;Manual techniques;Energy conservation;Dry needling   PT Next Visit Plan    Consulted and Agree with Plan of Care Patient        Problem List There are no active problems to display for this patient.   Jerl Mina ,PT, DPT, E-RYT  05/02/2015, 10:43 PM  [place] MAIN [facility] SERVICES [place]  [place], [place], [address] Phone: [phone]   Fax:  [phone]

## 2015-05-03 ENCOUNTER — Telehealth: Payer: Self-pay | Admitting: Gastroenterology

## 2015-05-03 NOTE — Telephone Encounter (Signed)
Marcella Dubs with Physical Therapy called to say that patient is doing excellent. No abdominal pain, her stools are normal, physical therapy has helped and just thanking [place] for the referral.

## 2015-05-08 ENCOUNTER — Ambulatory Visit: Admitting: Physical Therapy

## 2015-05-08 DIAGNOSIS — M629 Disorder of muscle, unspecified: Secondary | ICD-10-CM | POA: Diagnosis not present

## 2015-05-08 DIAGNOSIS — R279 Unspecified lack of coordination: Secondary | ICD-10-CM

## 2015-05-09 NOTE — Therapy (Signed)
[place] MAIN [facility] SERVICES [address] [place], [place], [address] Phone: [phone]   Fax:  [phone]  Physical Therapy Treatment  Patient Details  Name: Morgan Boyd MRN: [id number] Date of Birth: Sep 10, 1986 Referring Provider:  Andria Meuse, NP  Encounter Date: 05/08/2015      PT End of Session - 05/09/15 0858    Visit Number 6   Number of Visits 12   Date for PT Re-Evaluation 06/21/15   PT Start Time 5361   PT Stop Time 1600   PT Time Calculation (min) 45 min   Activity Tolerance Patient tolerated treatment well   Behavior During Therapy [facility] for tasks assessed/performed      Past Medical History  Diagnosis Date  . Allergy   . Bronchitis     Past Surgical History  Procedure Laterality Date  . Dilation and curettage of uterus  2010  . Tonsillectomy      There were no vitals filed for this visit.  Visit Diagnosis:  Fascial defect  Lack of coordination      Subjective Assessment - 05/08/15 1518    Subjective Pt reported she took a break from her exercises. Pt read the articles PT sent her and underwent her mindset change and not to get discouraged. Pt practiced the body scan which helped her to feel more "sure" of herself. Pt practiced lifting her trash properly and has not had back pain the past week. Pt arrives with her son.     Patient is accompained by: --  son   Pertinent History Hx of 2 vaginal deliveries, natural tearing with both children (1-2nd degree tears). Pt used to dance Editor, commissioning, jazz), teach dance fitness classes, [employer], running 3 mi 3x week  prior to pregnancy. Performed bootcamp and weight training (arms 30#), and crunches (25-100 / 2x/week for 8 months)   Patient Stated Goals 1) be able to return to dance and fitness classes 2) return to running 3x/week  3) hold baby without leaning backward  4) eliminate LBP 5) decreased    Currently in Pain? No/denies   Pain Score 0-No pain   Pain  Onset More than a month ago                         [facility] Adult PT Treatment/Exercise - 05/09/15 0805    Therapeutic Activites    Therapeutic Activities --  PT observed pt t/f son into car w/ proper form   Lifting lifting son with squat, outside BOS w/ side lunge   integrating workout into play w/ son   Other Therapeutic Activities zumba/point moves   cues for not hyperextending knee, wt bearing on ball mounds                PT Education - 05/09/15 0817    Education provided Yes   Education Details HEP, discussed next appt to be 2 weeks out in order for pt to practice return to dance and remaining goal   Person(s) Educated Patient   Methods Explanation;Demonstration;Tactile cues;Verbal cues   Comprehension Verbalized understanding;Returned demonstration             PT Long Term Goals - 05/08/15 1525    PT LONG TERM GOAL #1   Title Pt will score a decreased % on PGQ from 75% to < 50% in order to improve QOL and return to ADLs.   Time 12   Period Weeks  Status On-going   PT LONG TERM GOAL #2   Title Pt will report having regular bowel movements with Stool Type 3-4 for 3-5x/ week for 1 week in order to demo improved bowel function.   Time 12   Period Weeks   Status Achieved   PT LONG TERM GOAL #3   Title Pt will report no lingering pain after intercourse in order to demo improved pelvic floor function and improve QOL.    Time 12   Period Weeks   Status On-going   PT LONG TERM GOAL #4   Title Pt will demo decreased abdominal separation of < 2 fingers above and below umbilicus and demo proper activation of deep core mm with Dynamic Stabilization exercises 1-4 without cuing in order to return fitness exercises with minimized risk for injuries.    Time 12   Period Weeks   Status Achieved   PT LONG TERM GOAL #5   Title Pt will demo pelvic floor contraction 01/28/09 in order progress to running.    Time 12   Period Weeks   Status Achieved    Additional Long Term Goals   Additional Long Term Goals Yes   PT LONG TERM GOAL #6   Title Pt will report no back pain for one week with increased household activities while husband is away.    Time 12   Period Weeks   Status Achieved               Plan - 05/09/15 0914    Clinical Impression Statement Pt is progressing well with report of no back pain despite increased lifting activities with household chores. Pt is progressing well towards her goals and will return in 2 weeks to practicing return to dance/fitness and sexual intercourse.     Pt will benefit from skilled therapeutic intervention in order to improve on the following deficits Abnormal gait;Decreased coordination;Decreased balance;Decreased activity tolerance;Decreased endurance;Postural dysfunction;Improper body mechanics;Decreased range of motion;Decreased mobility;Other (comment);Pain;Obesity;Hypomobility;Decreased safety awareness;Decreased cognition   Rehab Potential Good   Clinical Impairments Affecting Rehab Potential Hx of pregnancies, performing excessive crunches, running after pregnancy   PT Frequency 1x / week   PT Duration 12 weeks   PT Treatment/Interventions ADLs/Self Care Home Management;Aquatic Therapy;Neuromuscular re-education;Gait training;Patient/family education;Moist Heat;Cryotherapy;Functional mobility training;Therapeutic activities;Balance training;Therapeutic exercise;Manual techniques;Energy conservation;Dry needling   PT Next Visit Plan running gait assessment, resistive band for thoracolumbar    Consulted and Agree with Plan of Care Patient        Problem List There are no active problems to display for this patient.   Jerl Mina  ,PT, DPT, E-RYT  05/09/2015, 9:21 AM  [place] MAIN [facility] SERVICES [address] [place], [place], [address] Phone: [phone]   Fax:  [phone]

## 2015-05-15 ENCOUNTER — Encounter: Admitting: Physical Therapy

## 2015-05-22 ENCOUNTER — Ambulatory Visit: Admitting: Physical Therapy

## 2017-03-24 ENCOUNTER — Telehealth: Payer: Self-pay | Admitting: Obstetrics and Gynecology

## 2017-03-24 NOTE — Telephone Encounter (Signed)
Pt aware of amended My Risk results with an ATM VUS now of no clinical significance. Hard copy mailed to pt. Will continue to await amended results on remaining STK11 VUS. Address updated to [place], [place] address since pt has moved away. Questions answered.

## 2019-06-07 ENCOUNTER — Ambulatory Visit: Payer: Self-pay | Admitting: Allergy

## 2019-06-29 ENCOUNTER — Other Ambulatory Visit: Payer: Self-pay

## 2019-06-29 ENCOUNTER — Encounter: Payer: Self-pay | Admitting: Allergy & Immunology

## 2019-06-29 ENCOUNTER — Ambulatory Visit (INDEPENDENT_AMBULATORY_CARE_PROVIDER_SITE_OTHER): Admitting: Allergy & Immunology

## 2019-06-29 VITALS — BP 102/80 | HR 71 | Temp 98.0°F | Resp 18 | Ht 63.0 in | Wt 212.0 lb

## 2019-06-29 DIAGNOSIS — L501 Idiopathic urticaria: Secondary | ICD-10-CM

## 2019-06-29 DIAGNOSIS — L508 Other urticaria: Secondary | ICD-10-CM | POA: Insufficient documentation

## 2019-06-29 MED ORDER — OMALIZUMAB 150 MG ~~LOC~~ SOLR
150.0000 mg | SUBCUTANEOUS | Status: DC
  Administered 2019-06-29: 16:00:00 150 mg via SUBCUTANEOUS

## 2019-06-29 MED ORDER — EPINEPHRINE 0.3 MG/0.3ML IJ SOAJ: 0.3000 mg | Freq: Once | INTRAMUSCULAR | 1 refills | Status: AC

## 2019-06-29 MED ORDER — ALBUTEROL SULFATE HFA 108 (90 BASE) MCG/ACT IN AERS: 2.0000 | INHALATION_SPRAY | Freq: Four times a day (QID) | RESPIRATORY_TRACT | 1 refills | Status: DC | PRN

## 2019-06-29 NOTE — Patient Instructions (Addendum)
1. 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 - We are going to get your records from the outside practice. - Continue with cetirizine twice daily.  - We do not need to do any new tests today. - Consent for Xolair signed. - Tammy will be reaching out to you to discuss more.   2. Return in about 6 months (around 12/27/2019). This can be an in-person, a virtual Webex or a telephone follow up visit.   Please inform us of any Emergency Department visits, hospitalizations, or changes in symptoms. Call us before going to the ED for breathing or allergy symptoms since we might be able to fit you in for a sick visit. Feel free to contact us anytime with any questions, problems, or concerns.  It was a pleasure to meet you today!  Websites that have reliable patient information: 1. American Academy of Asthma, Allergy, and Immunology: www.aaaai.org 2. Food Allergy Research and Education (FARE): foodallergy.org 3. Mothers of Asthmatics: http://www.asthmacommunitynetwork.org 4. American College of Allergy, Asthma, and Immunology: www.acaai.org  "Like" [place] on Facebook and Instagram for our latest updates!      Make sure you are registered to vote! If you have moved or changed any of your contact information, you will need to get this updated before voting!  In some cases, you MAY be able to register to vote online: [email or web address]    Voter ID laws are NOT going into effect for the General Election in November 2020! DO NOT let this stop you from exercising your right to vote!   Absentee voting is the SAFEST way to vote during the coronavirus pandemic!   Download and print an absentee ballot request form at [email or web address] or you can scan the QR code below with your smart phone:      More information on absentee ballots can be found here: [email or web address]

## 2019-06-29 NOTE — Progress Notes (Signed)
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 - We are going to get your records from the outside practice. - Continue with cetirizine twice daily.  - We do not need to do any new tests today. - Consent for Xolair signed. - Tammy will be reaching out to you to discuss more.   2. Return in about 6 months (around 12/27/2019). This can be an in-person, a virtual Webex or a telephone follow up visit.  Subjective:   Morgan Boyd is a 33 y.o. female presenting today for evaluation of No chief complaint on file.   Morgan Boyd has a history of the following: Patient Active Problem List   Diagnosis Date Noted  . 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  Morgan Boyd was referred by Patient, No Pcp Per.     Morgan Boyd is a 33 y.o. female presenting for an evaluation of chronic urticaria. She moved here from [place], [place].   She has a history of hives that have been ongoing since 2008. She was told that she had a pineapple allergy initially. However during this workup it was finally discovered that she autoimmune urticaria. She has been on Xolair for less than one year with marked improvement in her symptoms. She was on multiple antihistamines daily, but when she is on the Xolair, she has no symptoms whatsoever. She was receiving her Xolair via [facility].   She has not had cetirizine since Thursday in case we needed to do testing today. Her last Xolair was in June 2020. She has not had a Xolair since June and she has been forced to increase her cetirizine.   Otherwise, there is no history of other atopic diseases, including asthma, food allergies, drug allergies, environmental allergies,  stinging insect allergies, eczema or contact dermatitis. There is no significant infectious history. Vaccinations are up to date.    Past Medical History: Patient Active Problem List   Diagnosis Date Noted  . Autoimmune urticaria 06/29/2019    Medication List:  Allergies as of 06/29/2019      Reactions   Garlic    Pineapple    Sulfa Antibiotics       Medication List       Accurate as of June 29, 2019 10:33 PM. If you have any questions, ask your nurse or doctor.        STOP taking these medications   multivitamin-prenatal 27-0.8 MG Tabs tablet Stopped by: [name], MD   vitamin B-12 500 MCG tablet Commonly known as: CYANOCOBALAMIN Stopped by: [name], MD     TAKE these medications   albuterol 108 (90 Base) MCG/ACT inhaler Commonly known as: VENTOLIN HFA Inhale 2 puffs into the lungs every 6 (six) hours as needed for wheezing or shortness of breath. Started by: [name], MD   cetirizine 10 MG tablet Commonly known as: ZYRTEC Take 10 mg by mouth daily.   Clobetasol Propionate 0.05 % shampoo Apply topically once a week.   EPINEPHrine 0.3 mg/0.3 mL Soaj injection Commonly known as: EpiPen 2-Pak Inject 0.3 mLs (0.3 mg total) into the muscle once for 1 dose. Started by: [name], MD   levonorgestrel 20 MCG/24HR IUD Commonly known as:  MIRENA 1 each by Intrauterine route once.   omalizumab 150 MG injection Commonly known as: XOLAIR Inject into the skin.       Birth History: non-contributory  Developmental History: non-contributory  Past Surgical History: Past Surgical History:  Procedure Laterality Date  . DILATION AND CURETTAGE OF UTERUS  2010  . TONSILLECTOMY       Family History: Family History  Problem Relation Age of Onset  . Cancer Other   . Cancer Mother   . Cancer Paternal Aunt   . Prostate cancer Maternal Grandfather      Social History: Morgan Boyd lives at home with her family. 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 Her husband is from [place]. They have two children ages 38 and 76. The 8yo is doing fairly well with virtual learning and the 35yo is not enjoying the experience.    Review of Systems  Constitutional: Negative.  Negative for chills, fever, malaise/fatigue and weight loss.  HENT: Negative.  Negative for congestion, ear discharge and ear pain.   Eyes: Negative for pain, discharge and redness.  Respiratory: Negative for cough, sputum production, shortness of breath and wheezing.   Cardiovascular: Negative.  Negative for chest pain and palpitations.  Gastrointestinal: Negative for abdominal pain, heartburn, nausea and vomiting.  Skin: Positive for itching and rash.  Neurological: Negative for dizziness and headaches.  Endo/Heme/Allergies: Negative for environmental allergies. Does not bruise/bleed easily.       Objective:   Blood pressure 102/80, pulse 71, temperature 98 F (36.7 C), temperature source Temporal, resp. rate 18, height 5\' 3"  (1.6 m), weight 212 lb (96.2 kg), SpO2 98 %. Body mass index is 37.55 kg/m.   Physical Exam:   Physical Exam  Constitutional: She appears well-developed.  HENT:  Head: Normocephalic and atraumatic.  Right Ear: Tympanic membrane, external ear and ear canal normal. No drainage, swelling or tenderness. Tympanic membrane is not injected, not scarred, not erythematous, not retracted and not bulging.  Left Ear: Tympanic membrane, external ear and ear canal normal. No drainage, swelling or tenderness. Tympanic membrane is not injected, not scarred, not erythematous, not retracted and not bulging.  Nose: No mucosal edema, rhinorrhea, nasal deformity or septal deviation. No epistaxis. Right sinus exhibits no maxillary sinus tenderness and no frontal sinus tenderness. Left sinus exhibits no maxillary sinus tenderness and no frontal sinus tenderness.  Mouth/Throat: Uvula is midline and oropharynx is clear and moist. Mucous  membranes are not pale and not dry.  Eyes: Pupils are equal, round, and reactive to light. Conjunctivae and EOM are normal. Right eye exhibits no chemosis and no discharge. Left eye exhibits no chemosis and no discharge. Right conjunctiva is not injected. Left conjunctiva is not injected.  Cardiovascular: Normal rate, regular rhythm and normal heart sounds.  Respiratory: Effort normal and breath sounds normal. No accessory muscle usage. No tachypnea. No respiratory distress. She has no wheezes. She has no rhonchi. She has no rales. She exhibits no tenderness.  GI: There is no abdominal tenderness. There is no rebound and no guarding.  Lymphadenopathy:       Head (right side): No submandibular, no tonsillar and no occipital adenopathy present.       Head (left side): No submandibular, no tonsillar and no occipital adenopathy present.    She has no cervical adenopathy.  Neurological: She is alert.  Skin: No abrasion, no petechiae and no rash noted. Rash is not papular, not vesicular and not urticarial. No erythema. No pallor.  There is some  dermatographism noted.  Psychiatric: She has a normal mood and affect.     Diagnostic studies: none (we are requesting outside records today)        Salvatore Marvel, MD All[facility] and [facility]

## 2019-06-30 ENCOUNTER — Telehealth: Payer: Self-pay | Admitting: *Deleted

## 2019-06-30 NOTE — Telephone Encounter (Signed)
Called patient and advised will submit her Xolair Rx to [facility]. They will reach out to patient to get ok and ship to our office for her next injection which she already has set up.

## 2019-07-27 ENCOUNTER — Ambulatory Visit (INDEPENDENT_AMBULATORY_CARE_PROVIDER_SITE_OTHER)

## 2019-07-27 ENCOUNTER — Other Ambulatory Visit: Payer: Self-pay

## 2019-07-27 DIAGNOSIS — L508 Other urticaria: Secondary | ICD-10-CM

## 2019-07-27 MED ORDER — OMALIZUMAB 150 MG ~~LOC~~ SOLR
300.0000 mg | SUBCUTANEOUS | Status: AC
  Administered 2019-07-27 – 2024-11-05 (×60): 300 mg via SUBCUTANEOUS

## 2019-08-24 ENCOUNTER — Ambulatory Visit (INDEPENDENT_AMBULATORY_CARE_PROVIDER_SITE_OTHER)

## 2019-08-24 ENCOUNTER — Other Ambulatory Visit: Payer: Self-pay

## 2019-08-24 DIAGNOSIS — L508 Other urticaria: Secondary | ICD-10-CM

## 2019-08-24 DIAGNOSIS — L501 Idiopathic urticaria: Secondary | ICD-10-CM

## 2019-09-21 ENCOUNTER — Other Ambulatory Visit: Payer: Self-pay

## 2019-09-21 ENCOUNTER — Ambulatory Visit (INDEPENDENT_AMBULATORY_CARE_PROVIDER_SITE_OTHER): Admitting: *Deleted

## 2019-09-21 DIAGNOSIS — L501 Idiopathic urticaria: Secondary | ICD-10-CM | POA: Diagnosis not present

## 2019-09-21 DIAGNOSIS — L508 Other urticaria: Secondary | ICD-10-CM

## 2019-10-20 ENCOUNTER — Ambulatory Visit (INDEPENDENT_AMBULATORY_CARE_PROVIDER_SITE_OTHER): Admitting: *Deleted

## 2019-10-20 ENCOUNTER — Other Ambulatory Visit: Payer: Self-pay

## 2019-10-20 DIAGNOSIS — L508 Other urticaria: Secondary | ICD-10-CM

## 2019-10-20 DIAGNOSIS — L501 Idiopathic urticaria: Secondary | ICD-10-CM | POA: Diagnosis not present

## 2019-11-18 ENCOUNTER — Other Ambulatory Visit: Payer: Self-pay

## 2019-11-18 ENCOUNTER — Ambulatory Visit (INDEPENDENT_AMBULATORY_CARE_PROVIDER_SITE_OTHER)

## 2019-11-18 DIAGNOSIS — L501 Idiopathic urticaria: Secondary | ICD-10-CM

## 2019-11-18 DIAGNOSIS — L508 Other urticaria: Secondary | ICD-10-CM

## 2019-12-16 ENCOUNTER — Ambulatory Visit (INDEPENDENT_AMBULATORY_CARE_PROVIDER_SITE_OTHER)

## 2019-12-16 ENCOUNTER — Other Ambulatory Visit: Payer: Self-pay

## 2019-12-16 DIAGNOSIS — L508 Other urticaria: Secondary | ICD-10-CM

## 2019-12-16 DIAGNOSIS — L501 Idiopathic urticaria: Secondary | ICD-10-CM | POA: Diagnosis not present

## 2019-12-28 ENCOUNTER — Other Ambulatory Visit: Payer: Self-pay

## 2019-12-28 ENCOUNTER — Encounter: Payer: Self-pay | Admitting: Allergy & Immunology

## 2019-12-28 ENCOUNTER — Ambulatory Visit (INDEPENDENT_AMBULATORY_CARE_PROVIDER_SITE_OTHER): Admitting: Allergy & Immunology

## 2019-12-28 VITALS — BP 110/82 | HR 80 | Temp 97.6°F | Resp 16 | Ht 63.0 in | Wt 224.6 lb

## 2019-12-28 DIAGNOSIS — L508 Other urticaria: Secondary | ICD-10-CM

## 2019-12-28 MED ORDER — EPINEPHRINE 0.3 MG/0.3ML IJ SOAJ: 0.3000 mg | Freq: Once | INTRAMUSCULAR | 1 refills | Status: AC

## 2019-12-28 MED ORDER — ALBUTEROL SULFATE HFA 108 (90 BASE) MCG/ACT IN AERS: 2.0000 | INHALATION_SPRAY | Freq: Four times a day (QID) | RESPIRATORY_TRACT | 1 refills | Status: AC | PRN

## 2019-12-28 NOTE — Addendum Note (Signed)
Addended by: Kennon Rounds on: 12/28/2019 04:29 PM   Modules accepted: Orders

## 2019-12-28 NOTE — Patient Instructions (Addendum)
1. 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 - It seems that you have a good grasp on everything.  - Stress can definitely a trigger for hives.  - EpiPen is up to date.    2. Return in about 1 year (around 12/27/2020). This can be an in-person, a virtual Webex or a telephone follow up visit.   Please inform us of any Emergency Department visits, hospitalizations, or changes in symptoms. Call us before going to the ED for breathing or allergy symptoms since we might be able to fit you in for a sick visit. Feel free to contact us anytime with any questions, problems, or concerns.  It was a pleasure to see you again today!  Websites that have reliable patient information: 1. American Academy of Asthma, Allergy, and Immunology: www.aaaai.org 2. Food Allergy Research and Education (FARE): foodallergy.org 3. Mothers of Asthmatics: http://www.asthmacommunitynetwork.org 4. American College of Allergy, Asthma, and Immunology: www.acaai.org   COVID-19 Vaccine Information can be found at: ShippingScam.[email or web address].uk For questions related to vaccine distribution or appointments, please email [email or web address] or call [phone].     "Like" [place] on Facebook and Instagram for our latest updates!        Make sure you are registered to vote! If you have moved or changed any of your contact information, you will need to get this updated before voting!  In some cases, you MAY be able to register to vote online: [email or web address]

## 2019-12-28 NOTE — Progress Notes (Signed)
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 - It seems that you have a good grasp on everything.  - Stress can definitely a trigger for hives.  - EpiPen is up to date.    2. Return in about 1 year (around 12/27/2020). This can be an in-person, a virtual Webex or a telephone follow up visit.   Subjective:   Morgan Boyd is a 34 y.o. female presenting today for follow up of  Chief Complaint  Patient presents with  . Urticaria    has been having flares lately, ?elevated stress, last week was last outbreak    Morgan Boyd has a history of the following: Patient Active Problem List   Diagnosis Date Noted  . 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  Morgan Boyd is a 34 y.o. female presenting for a follow up visit. She was last seen in September 2020. At that time, we got her Xolair restarted.  She has a history of autoimmune urticaria.  We continue with her cetirizine twice daily.  We did not do any new testing since she had already undergone a thorough work-up.  In the interim, she has done very well. She is not needing to take cetirizine on a daily basis. She estimates that she goes weeks without taking it at all. She estimates that 3 days out of the month leads to cetirizine.  She has been undergoing a lot more stress recently because school opened up.  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  The high school students do not wear their masks as they should, which is stress her out a lot.  Otherwise she has done very well.    Otherwise, there have been no changes to her past medical history, surgical history, family history, or social history.    Review of Systems  Constitutional: Negative.  Negative for chills, fever,  malaise/fatigue and weight loss.  HENT: Negative.  Negative for congestion, ear discharge, ear pain, sinus pain and sore throat.   Eyes: Negative for pain, discharge and redness.  Respiratory: Negative for cough, sputum production, shortness of breath and wheezing.   Cardiovascular: Negative.  Negative for chest pain and palpitations.  Gastrointestinal: Negative for abdominal pain, constipation, diarrhea, heartburn, nausea and vomiting.  Skin: Negative.  Negative for itching and rash.  Neurological: Negative for dizziness and headaches.  Endo/Heme/Allergies: Negative for environmental allergies. Does not bruise/bleed easily.       Objective:   Blood pressure 110/82, pulse 80, temperature 97.6 F (36.4 C), temperature source Temporal, resp. rate 16, height 5\' 3"  (1.6 m), weight 224 lb 9.6 oz (101.9 kg), SpO2 99 %. Body mass index is 39.79 kg/m.   Physical Exam:  Physical Exam  Constitutional: She appears well-developed.  Moving air well in all lung fields.   HENT:  Head: Normocephalic and atraumatic.  Right Ear: Tympanic membrane, external ear and ear canal normal.  Left Ear: Tympanic membrane and ear canal normal.  Nose: No mucosal edema, rhinorrhea, nasal deformity or septal deviation. No epistaxis. Right sinus exhibits no maxillary sinus tenderness and no frontal sinus tenderness. Left sinus exhibits no maxillary sinus tenderness and no frontal sinus tenderness.  Mouth/Throat: Uvula is midline and oropharynx is clear and moist. Mucous membranes are  not pale and not dry.  Eyes: Pupils are equal, round, and reactive to light. Conjunctivae and EOM are normal. Right eye exhibits no chemosis and no discharge. Left eye exhibits no chemosis and no discharge. Right conjunctiva is not injected. Left conjunctiva is not injected.  Cardiovascular: Normal rate, regular rhythm and normal heart sounds.  Respiratory: Effort normal and breath sounds normal. No accessory muscle usage. No tachypnea.  No respiratory distress. She has no wheezes. She has no rhonchi. She has no rales. She exhibits no tenderness.  Lymphadenopathy:    She has no cervical adenopathy.  Neurological: She is alert.  Skin: No abrasion, no petechiae and no rash noted. Rash is not papular, not vesicular and not urticarial. No erythema. No pallor.  Psychiatric: She has a normal mood and affect.     Diagnostic studies: none     Salvatore Marvel, MD  [facility] and [facility] [place]

## 2020-01-06 ENCOUNTER — Ambulatory Visit: Attending: Family

## 2020-01-06 DIAGNOSIS — Z23 Encounter for immunization: Secondary | ICD-10-CM

## 2020-01-06 NOTE — Progress Notes (Signed)
   Covid-19 Vaccination Clinic  Name:  Morgan Boyd    MRN: [id number] DOB: 1986/02/04  01/06/2020  [name] was observed post Covid-19 immunization for 15 minutes without incident. She was provided with Vaccine Information Sheet and instruction to access the V-Safe system.   [name] was instructed to call 911 with any severe reactions post vaccine: Marland Kitchen Difficulty breathing  . Swelling of face and throat  . A fast heartbeat  . A bad rash all over body  . Dizziness and weakness   Immunizations Administered    Name Date Dose VIS Date Route   Moderna COVID-19 Vaccine 01/06/2020  1:41 PM 0.5 mL 09/21/2019 Intramuscular   Manufacturer: Moderna   Lot: [id number]   [id number]

## 2020-01-13 ENCOUNTER — Other Ambulatory Visit: Payer: Self-pay

## 2020-01-13 ENCOUNTER — Ambulatory Visit (INDEPENDENT_AMBULATORY_CARE_PROVIDER_SITE_OTHER)

## 2020-01-13 DIAGNOSIS — L501 Idiopathic urticaria: Secondary | ICD-10-CM

## 2020-01-13 DIAGNOSIS — L508 Other urticaria: Secondary | ICD-10-CM

## 2020-02-08 ENCOUNTER — Ambulatory Visit: Attending: Family

## 2020-02-08 DIAGNOSIS — Z23 Encounter for immunization: Secondary | ICD-10-CM

## 2020-02-08 NOTE — Progress Notes (Signed)
   Covid-19 Vaccination Clinic  Name:  Morgan Boyd    MRN: [id number] DOB: [birth year]  02/08/2020  [name] was observed post Covid-19 immunization for 15 minutes without incident. She was provided with Vaccine Information Sheet and instruction to access the V-Safe system.   [name] was instructed to call 911 with any severe reactions post vaccine: Marland Kitchen Difficulty breathing  . Swelling of face and throat  . A fast heartbeat  . A bad rash all over body  . Dizziness and weakness   Immunizations Administered    Name Date Dose VIS Date Route   Moderna COVID-19 Vaccine 02/08/2020  1:21 PM 0.5 mL 09/2019 Intramuscular   Manufacturer: Moderna   Lot: [id number]   [place]:[id number]

## 2020-02-10 ENCOUNTER — Ambulatory Visit: Payer: Self-pay

## 2020-02-18 ENCOUNTER — Ambulatory Visit (INDEPENDENT_AMBULATORY_CARE_PROVIDER_SITE_OTHER)

## 2020-02-18 ENCOUNTER — Other Ambulatory Visit: Payer: Self-pay

## 2020-02-18 DIAGNOSIS — L501 Idiopathic urticaria: Secondary | ICD-10-CM

## 2020-02-18 DIAGNOSIS — L508 Other urticaria: Secondary | ICD-10-CM

## 2020-03-17 ENCOUNTER — Ambulatory Visit: Payer: Self-pay

## 2020-03-21 ENCOUNTER — Ambulatory Visit (INDEPENDENT_AMBULATORY_CARE_PROVIDER_SITE_OTHER)

## 2020-03-21 ENCOUNTER — Other Ambulatory Visit: Payer: Self-pay

## 2020-03-21 DIAGNOSIS — L508 Other urticaria: Secondary | ICD-10-CM

## 2020-03-21 DIAGNOSIS — L501 Idiopathic urticaria: Secondary | ICD-10-CM

## 2020-04-18 ENCOUNTER — Ambulatory Visit: Payer: Self-pay

## 2020-04-25 ENCOUNTER — Ambulatory Visit (INDEPENDENT_AMBULATORY_CARE_PROVIDER_SITE_OTHER)

## 2020-04-25 ENCOUNTER — Other Ambulatory Visit: Payer: Self-pay

## 2020-04-25 DIAGNOSIS — L501 Idiopathic urticaria: Secondary | ICD-10-CM

## 2020-04-25 DIAGNOSIS — L508 Other urticaria: Secondary | ICD-10-CM

## 2020-05-23 ENCOUNTER — Ambulatory Visit (INDEPENDENT_AMBULATORY_CARE_PROVIDER_SITE_OTHER)

## 2020-05-23 ENCOUNTER — Ambulatory Visit

## 2020-05-23 ENCOUNTER — Other Ambulatory Visit: Payer: Self-pay

## 2020-05-23 ENCOUNTER — Other Ambulatory Visit: Payer: Self-pay | Admitting: *Deleted

## 2020-05-23 DIAGNOSIS — L501 Idiopathic urticaria: Secondary | ICD-10-CM | POA: Diagnosis not present

## 2020-05-23 DIAGNOSIS — L508 Other urticaria: Secondary | ICD-10-CM

## 2020-05-23 MED ORDER — OMALIZUMAB 150 MG ~~LOC~~ SOLR: 300.0000 mg | SUBCUTANEOUS | 11 refills | Status: DC

## 2020-06-20 ENCOUNTER — Other Ambulatory Visit: Payer: Self-pay

## 2020-06-20 ENCOUNTER — Ambulatory Visit (INDEPENDENT_AMBULATORY_CARE_PROVIDER_SITE_OTHER)

## 2020-06-20 DIAGNOSIS — L508 Other urticaria: Secondary | ICD-10-CM

## 2020-06-20 DIAGNOSIS — L501 Idiopathic urticaria: Secondary | ICD-10-CM

## 2020-07-18 ENCOUNTER — Ambulatory Visit (INDEPENDENT_AMBULATORY_CARE_PROVIDER_SITE_OTHER): Admitting: *Deleted

## 2020-07-18 ENCOUNTER — Other Ambulatory Visit: Payer: Self-pay

## 2020-07-18 DIAGNOSIS — L501 Idiopathic urticaria: Secondary | ICD-10-CM | POA: Diagnosis not present

## 2020-07-18 DIAGNOSIS — L508 Other urticaria: Secondary | ICD-10-CM

## 2020-08-15 ENCOUNTER — Ambulatory Visit (INDEPENDENT_AMBULATORY_CARE_PROVIDER_SITE_OTHER)

## 2020-08-15 ENCOUNTER — Other Ambulatory Visit: Payer: Self-pay

## 2020-08-15 DIAGNOSIS — L501 Idiopathic urticaria: Secondary | ICD-10-CM

## 2020-08-15 DIAGNOSIS — L508 Other urticaria: Secondary | ICD-10-CM

## 2020-09-12 ENCOUNTER — Ambulatory Visit (INDEPENDENT_AMBULATORY_CARE_PROVIDER_SITE_OTHER)

## 2020-09-12 ENCOUNTER — Other Ambulatory Visit: Payer: Self-pay

## 2020-09-12 ENCOUNTER — Ambulatory Visit

## 2020-09-12 DIAGNOSIS — L501 Idiopathic urticaria: Secondary | ICD-10-CM | POA: Diagnosis not present

## 2020-09-12 DIAGNOSIS — L508 Other urticaria: Secondary | ICD-10-CM

## 2020-10-10 ENCOUNTER — Other Ambulatory Visit: Payer: Self-pay

## 2020-10-10 ENCOUNTER — Ambulatory Visit (INDEPENDENT_AMBULATORY_CARE_PROVIDER_SITE_OTHER): Admitting: *Deleted

## 2020-10-10 DIAGNOSIS — L501 Idiopathic urticaria: Secondary | ICD-10-CM

## 2020-10-10 DIAGNOSIS — L508 Other urticaria: Secondary | ICD-10-CM

## 2020-11-07 ENCOUNTER — Ambulatory Visit (INDEPENDENT_AMBULATORY_CARE_PROVIDER_SITE_OTHER)

## 2020-11-07 ENCOUNTER — Other Ambulatory Visit: Payer: Self-pay

## 2020-11-07 DIAGNOSIS — L508 Other urticaria: Secondary | ICD-10-CM

## 2020-11-07 DIAGNOSIS — L501 Idiopathic urticaria: Secondary | ICD-10-CM

## 2020-12-05 ENCOUNTER — Ambulatory Visit: Payer: Self-pay

## 2020-12-07 ENCOUNTER — Encounter: Payer: Self-pay | Admitting: Allergy & Immunology

## 2020-12-07 ENCOUNTER — Ambulatory Visit (INDEPENDENT_AMBULATORY_CARE_PROVIDER_SITE_OTHER): Admitting: Allergy & Immunology

## 2020-12-07 ENCOUNTER — Other Ambulatory Visit: Payer: Self-pay

## 2020-12-07 VITALS — Ht 63.0 in | Wt 220.0 lb

## 2020-12-07 DIAGNOSIS — L508 Other urticaria: Secondary | ICD-10-CM | POA: Diagnosis not present

## 2020-12-07 NOTE — Progress Notes (Signed)
RE: Morgan Boyd MRN: [id number] DOB: 1986-06-17 Date of Telemedicine Visit: 12/07/2020  Referring provider: No ref. provider found Primary care provider: Patient, No Pcp Per  Chief Complaint: Urticaria   Telemedicine Follow Up Visit via Telephone: I connected with Morgan Boyd for a follow up on 12/07/20 by telephone and verified that I am speaking with the correct person using two identifiers.   I discussed the limitations, risks, security and privacy concerns of performing an evaluation and management service by telephone and the availability of in person appointments. I also discussed with the patient that there may be a patient responsible charge related to this service. The patient expressed understanding and agreed to proceed.  Patient is at home.  Provider is at the office.  Visit start time: 2:35 PM Visit end time: 2:51 PM Insurance consent/check in by: Apolonio Schneiders  Medical consent and medical assistant/nurse: Morey Hummingbird  History of Present Illness:  She is a 35 y.o. female, who is being followed for autoimmune urticaria. Her previous allergy office visit was in March 2021 with myself.  She has a history of autoimmune urticaria.  I last saw her in March 2021.  She was doing great at that time on monthly dosing.  She was doing well without even cetirizine on board at the last visit.  Since last visit, she has done well. She is 5wks pregnancy. She is due October 19th. She is fine with continuing with her Xolair but the shot room staff requested that she talk to an MD about this first.   She reports that her Xolair is working fairly well. She had an outbreak one month ago. Her hives were going crazy. It was working well prior to this. She took cetirizine 20mg  twice daily. She was taking it for three weeks. She had previously doing well without cetirizine.   She has not been on Xolair during any of her previous pregnancies. She is open to continuing it since pregnancy always tends  to make her hives worse.  Otherwise, there have been no changes to her past medical history, surgical history, family history, or social history.  Assessment and Plan:  Morgan Boyd is a 35 y.o. female with:  Autoimmune urticaria- stable on Xolair monthly   We are going to continue with the Xolair monthly. I told Morgan Boyd that Xolair has the longest safety profile with regards to use during pregnancy. I have had a number of patients who have bene pregnant stay on Xolair during their pregnancy without any problems. We discussed that the Xolair does cross the placenta during a certain time of the gestation, but the blockage of IgE in the fetus does not really affect development to a large extent.  Diagnostics: None.  Medication List:  Current Outpatient Medications  Medication Sig Dispense Refill  . albuterol (VENTOLIN HFA) 108 (90 Base) MCG/ACT inhaler Inhale 2 puffs into the lungs every 6 (six) hours as needed for wheezing or shortness of breath. 18 g 1  . cetirizine (ZYRTEC) 10 MG tablet Take 10 mg by mouth daily.    . Clobetasol Propionate 0.05 % shampoo Apply topically once a week.    Marland Kitchen levonorgestrel (MIRENA) 20 MCG/24HR IUD 1 each by Intrauterine route once.    Marland Kitchen omalizumab (XOLAIR) 150 MG injection Inject 300 mg into the skin every 28 (twenty-eight) days. 2 each 11   Current Facility-Administered Medications  Medication Dose Route Frequency Provider Last Rate Last Admin  . omalizumab Arvid Right) injection 300 mg  300 mg Subcutaneous Q28 days  [name], MD   300 mg at 11/07/20 1720   Allergies: Allergies  Allergen Reactions  . Garlic   . Pineapple   . Sulfa Antibiotics    I reviewed her past medical history, social history, family history, and environmental history and no significant changes have been reported from previous visits.  Review of Systems  Constitutional: Negative for activity change and appetite change.  HENT: Negative for congestion, postnasal drip,  rhinorrhea, sinus pressure and sore throat.   Eyes: Negative for pain, discharge, redness and itching.  Respiratory: Negative for shortness of breath, wheezing and stridor.   Gastrointestinal: Negative for diarrhea, nausea and vomiting.  Musculoskeletal: Negative for arthralgias, joint swelling and myalgias.  Skin: Negative for rash.  Allergic/Immunologic: Negative for environmental allergies and food allergies.    Objective:  Physical exam not obtained as encounter was done via telephone.   Previous notes and tests were reviewed.  I discussed the assessment and treatment plan with the patient. The patient was provided an opportunity to ask questions and all were answered. The patient agreed with the plan and demonstrated an understanding of the instructions.   The patient was advised to call back or seek an in-person evaluation if the symptoms worsen or if the condition fails to improve as anticipated.  I provided 16 minutes of non-face-to-face time during this encounter.  It was my pleasure to participate in Morgan Boyd's care today. Please feel free to contact me with any questions or concerns.   Sincerely,  [name], MD

## 2020-12-11 ENCOUNTER — Ambulatory Visit (INDEPENDENT_AMBULATORY_CARE_PROVIDER_SITE_OTHER)

## 2020-12-11 ENCOUNTER — Other Ambulatory Visit: Payer: Self-pay

## 2020-12-11 DIAGNOSIS — L501 Idiopathic urticaria: Secondary | ICD-10-CM | POA: Diagnosis not present

## 2020-12-11 DIAGNOSIS — L508 Other urticaria: Secondary | ICD-10-CM

## 2021-01-09 ENCOUNTER — Ambulatory Visit (INDEPENDENT_AMBULATORY_CARE_PROVIDER_SITE_OTHER): Admitting: *Deleted

## 2021-01-09 ENCOUNTER — Other Ambulatory Visit: Payer: Self-pay

## 2021-01-09 DIAGNOSIS — L501 Idiopathic urticaria: Secondary | ICD-10-CM

## 2021-01-09 DIAGNOSIS — L508 Other urticaria: Secondary | ICD-10-CM

## 2021-02-06 ENCOUNTER — Ambulatory Visit: Payer: Self-pay

## 2021-02-15 ENCOUNTER — Other Ambulatory Visit: Payer: Self-pay

## 2021-02-15 ENCOUNTER — Ambulatory Visit (INDEPENDENT_AMBULATORY_CARE_PROVIDER_SITE_OTHER): Admitting: *Deleted

## 2021-02-15 DIAGNOSIS — L501 Idiopathic urticaria: Secondary | ICD-10-CM

## 2021-02-15 DIAGNOSIS — L508 Other urticaria: Secondary | ICD-10-CM

## 2021-03-15 ENCOUNTER — Ambulatory Visit: Payer: Self-pay

## 2021-03-27 ENCOUNTER — Other Ambulatory Visit: Payer: Self-pay | Admitting: Obstetrics and Gynecology

## 2021-03-27 DIAGNOSIS — O283 Abnormal ultrasonic finding on antenatal screening of mother: Secondary | ICD-10-CM

## 2021-03-27 DIAGNOSIS — Z363 Encounter for antenatal screening for malformations: Secondary | ICD-10-CM

## 2021-03-30 ENCOUNTER — Telehealth: Payer: Self-pay

## 2021-03-30 NOTE — Telephone Encounter (Signed)
Mailed patient her Morgan Boyd with a Morgan patient letter and map.

## 2021-04-03 ENCOUNTER — Other Ambulatory Visit: Payer: Self-pay

## 2021-04-12 ENCOUNTER — Encounter: Payer: Self-pay | Admitting: *Deleted

## 2021-04-18 ENCOUNTER — Encounter: Payer: Self-pay | Admitting: *Deleted

## 2021-04-18 ENCOUNTER — Ambulatory Visit (HOSPITAL_BASED_OUTPATIENT_CLINIC_OR_DEPARTMENT_OTHER): Admitting: Obstetrics

## 2021-04-18 ENCOUNTER — Ambulatory Visit (HOSPITAL_BASED_OUTPATIENT_CLINIC_OR_DEPARTMENT_OTHER)

## 2021-04-18 ENCOUNTER — Ambulatory Visit: Admitting: *Deleted

## 2021-04-18 ENCOUNTER — Other Ambulatory Visit: Payer: Self-pay

## 2021-04-18 ENCOUNTER — Other Ambulatory Visit: Payer: Self-pay | Admitting: Obstetrics

## 2021-04-18 ENCOUNTER — Other Ambulatory Visit: Payer: Self-pay | Admitting: Obstetrics and Gynecology

## 2021-04-18 ENCOUNTER — Other Ambulatory Visit: Payer: Self-pay | Admitting: *Deleted

## 2021-04-18 ENCOUNTER — Ambulatory Visit

## 2021-04-18 VITALS — BP 135/88 | HR 74

## 2021-04-18 DIAGNOSIS — O365990 Maternal care for other known or suspected poor fetal growth, unspecified trimester, not applicable or unspecified: Secondary | ICD-10-CM

## 2021-04-18 DIAGNOSIS — O365920 Maternal care for other known or suspected poor fetal growth, second trimester, not applicable or unspecified: Secondary | ICD-10-CM

## 2021-04-18 DIAGNOSIS — D259 Leiomyoma of uterus, unspecified: Secondary | ICD-10-CM | POA: Insufficient documentation

## 2021-04-18 DIAGNOSIS — O358XX0 Maternal care for other (suspected) fetal abnormality and damage, not applicable or unspecified: Secondary | ICD-10-CM | POA: Insufficient documentation

## 2021-04-18 DIAGNOSIS — O09522 Supervision of elderly multigravida, second trimester: Secondary | ICD-10-CM

## 2021-04-18 DIAGNOSIS — O3412 Maternal care for benign tumor of corpus uteri, second trimester: Secondary | ICD-10-CM

## 2021-04-18 DIAGNOSIS — Z3689 Encounter for other specified antenatal screening: Secondary | ICD-10-CM

## 2021-04-18 DIAGNOSIS — O283 Abnormal ultrasonic finding on antenatal screening of mother: Secondary | ICD-10-CM

## 2021-04-18 DIAGNOSIS — Z3A24 24 weeks gestation of pregnancy: Secondary | ICD-10-CM

## 2021-04-18 DIAGNOSIS — IMO0002 Reserved for concepts with insufficient information to code with codable children: Secondary | ICD-10-CM

## 2021-04-18 DIAGNOSIS — Z363 Encounter for antenatal screening for malformations: Secondary | ICD-10-CM | POA: Insufficient documentation

## 2021-04-18 DIAGNOSIS — O365921 Maternal care for other known or suspected poor fetal growth, second trimester, fetus 1: Secondary | ICD-10-CM | POA: Insufficient documentation

## 2021-04-18 NOTE — Progress Notes (Signed)
MFM Note  This patient was seen for a detailed ultrasound as echogenic bowel and fetal growth restriction were noted on a recent ultrasound performed in your office.  Her pregnancy has also been complicated by a fibroid uterus and advanced maternal age.  She denies any significant past medical history and denies any other problems in her current pregnancy.    She had a cell free DNA test drawn earlier in her pregnancy that showed a low risk for trisomy 71, 14, and 13.  A female fetus is predicted.  On today's exam, the EFW measures at the 1st percentile for her gestational age indicating severe fetal growth restriction.  There was normal amniotic fluid noted.  Doppler studies of the umbilical arteries performed today showed absent end-diastolic flow.  There were no signs of reversed end-diastolic flow noted.  Echogenic bowel was noted on today's exam. The causes of echogenic bowel including a normal variant, fetal aneuploidy, swallowed  blood, cystic fibrosis, and viral infections were discussed.  She was also advised that echogenic bowel may be seen in growth restricted fetuses.  She denies any recent vaginal bleeding. She should be screened for cystic fibrosis if she has not been screened already.  The views of the fetal anatomy were limited today due to the smaller fetal size and the fetal position.  Multiple fibroids were noted throughout her uterus. The largest fibroid measures about 9 cm and is located in the upper right corner of her uterus.  The implications and management of fetal growth restriction was discussed with the patient today.  She was advised that due to severe fetal growth restriction with abnormal umbilical artery Doppler studies, she will most likely require an indicated preterm delivery.  Due to the abnormal umbilical artery Doppler studies noted today, placental dysfunction is the most likely cause of fetal growth restriction.  We will continue to follow her closely with  twice weekly umbilical artery Doppler exams to screen for reversed end-diastolic flow.  We will start weekly nonstress tests at around 26 weeks.  She was advised to continue to monitor for fetal movements on a daily basis and to go to the hospital should she complain of decreased fetal movements.  The increased risk of a fetal demise due to fetal growth restriction with abnormal umbilical artery Doppler studies was discussed.   As the fetus is potentially viable based on the EFW obtained today and her gestational age, I would have a low threshold for hospitalization with inpatient management and daily fetal testing should she complain of any decreased fetal movements or should there be reversed end-diastolic flow noted during her future umbilical artery Doppler studies.    The goal for her delivery is 30 to 34 weeks depending on her umbilical artery Doppler studies and fetal testing.  A course of antenatal corticosteroids should be administered should delivery be considered.  Should she require delivery before 32 weeks, magnesium sulfate should be given for fetal neuroprotection.  She understands that most severely growth restricted fetuses will not tolerate the labor process and that there is a high likelihood that she will require cesarean delivery.  Due to advanced maternal age and the echogenic bowel noted today, the patient was offered and declined an amniocentesis for definitive diagnosis of fetal aneuploidy.  She stated that she was comfortable with her negative cell free DNA test.  Due to the echogenic bowel noted today, CMV and toxoplasmosis titer levels were ordered today.  The patient will return to our office in two  days for another umbilical artery Doppler study.  We will then see her two times per week starting next week.  A total of 45 minutes was spent counseling and coordinating the care for this patient.  Greater than 50% of the time was spent in direct face-to-face contact.

## 2021-04-19 LAB — TOXOPLASMA GONDII ANTIBODY, IGM: Toxoplasma Antibody- IgM: <3 AU/mL (ref 0.0–7.9)

## 2021-04-19 LAB — INFECT DISEASE AB IGM REFLEX 1

## 2021-04-19 LAB — CMV ANTIBODY, IGG (EIA): CMV Ab - IgG: 2.1 U/mL — ABNORMAL HIGH (ref 0.00–0.59)

## 2021-04-19 LAB — CMV IGM: CMV IgM Ser EIA-aCnc: <30 AU/mL (ref 0.0–29.9)

## 2021-04-19 LAB — TOXOPLASMA GONDII ANTIBODY, IGG: Toxoplasma IgG Ratio: <3 IU/mL (ref 0.0–7.1)

## 2021-04-20 ENCOUNTER — Other Ambulatory Visit: Payer: Self-pay

## 2021-04-20 ENCOUNTER — Ambulatory Visit: Admitting: *Deleted

## 2021-04-20 ENCOUNTER — Ambulatory Visit (HOSPITAL_BASED_OUTPATIENT_CLINIC_OR_DEPARTMENT_OTHER)

## 2021-04-20 ENCOUNTER — Ambulatory Visit (HOSPITAL_BASED_OUTPATIENT_CLINIC_OR_DEPARTMENT_OTHER): Admitting: Obstetrics

## 2021-04-20 ENCOUNTER — Inpatient Hospital Stay (HOSPITAL_COMMUNITY)
Admission: AD | Admit: 2021-04-20 | Discharge: 2021-05-17 | DRG: 787 | Disposition: A | Discharge status: Home / Self Care | Attending: Obstetrics & Gynecology | Admitting: Obstetrics & Gynecology

## 2021-04-20 ENCOUNTER — Encounter (HOSPITAL_COMMUNITY): Payer: Self-pay | Admitting: Obstetrics & Gynecology

## 2021-04-20 ENCOUNTER — Encounter: Payer: Self-pay | Admitting: *Deleted

## 2021-04-20 VITALS — BP 139/92 | HR 91

## 2021-04-20 DIAGNOSIS — O134 Gestational [pregnancy-induced] hypertension without significant proteinuria, complicating childbirth: Secondary | ICD-10-CM | POA: Diagnosis present

## 2021-04-20 DIAGNOSIS — O288 Other abnormal findings on antenatal screening of mother: Secondary | ICD-10-CM | POA: Diagnosis not present

## 2021-04-20 DIAGNOSIS — Z3A24 24 weeks gestation of pregnancy: Secondary | ICD-10-CM

## 2021-04-20 DIAGNOSIS — O365920 Maternal care for other known or suspected poor fetal growth, second trimester, not applicable or unspecified: Secondary | ICD-10-CM

## 2021-04-20 DIAGNOSIS — D62 Acute posthemorrhagic anemia: Secondary | ICD-10-CM | POA: Diagnosis not present

## 2021-04-20 DIAGNOSIS — O99284 Endocrine, nutritional and metabolic diseases complicating childbirth: Secondary | ICD-10-CM | POA: Diagnosis present

## 2021-04-20 DIAGNOSIS — E669 Obesity, unspecified: Secondary | ICD-10-CM

## 2021-04-20 DIAGNOSIS — O99214 Obesity complicating childbirth: Secondary | ICD-10-CM | POA: Diagnosis present

## 2021-04-20 DIAGNOSIS — Z20822 Contact with and (suspected) exposure to covid-19: Secondary | ICD-10-CM | POA: Diagnosis present

## 2021-04-20 DIAGNOSIS — Z23 Encounter for immunization: Secondary | ICD-10-CM

## 2021-04-20 DIAGNOSIS — O10012 Pre-existing essential hypertension complicating pregnancy, second trimester: Secondary | ICD-10-CM | POA: Diagnosis not present

## 2021-04-20 DIAGNOSIS — O43192 Other malformation of placenta, second trimester: Secondary | ICD-10-CM

## 2021-04-20 DIAGNOSIS — D251 Intramural leiomyoma of uterus: Secondary | ICD-10-CM | POA: Diagnosis present

## 2021-04-20 DIAGNOSIS — O99212 Obesity complicating pregnancy, second trimester: Secondary | ICD-10-CM

## 2021-04-20 DIAGNOSIS — O365990 Maternal care for other known or suspected poor fetal growth, unspecified trimester, not applicable or unspecified: Secondary | ICD-10-CM | POA: Diagnosis present

## 2021-04-20 DIAGNOSIS — D259 Leiomyoma of uterus, unspecified: Secondary | ICD-10-CM

## 2021-04-20 DIAGNOSIS — O3412 Maternal care for benign tumor of corpus uteri, second trimester: Secondary | ICD-10-CM

## 2021-04-20 DIAGNOSIS — O283 Abnormal ultrasonic finding on antenatal screening of mother: Secondary | ICD-10-CM | POA: Insufficient documentation

## 2021-04-20 DIAGNOSIS — O368120 Decreased fetal movements, second trimester, not applicable or unspecified: Secondary | ICD-10-CM | POA: Diagnosis present

## 2021-04-20 DIAGNOSIS — O1494 Unspecified pre-eclampsia, complicating childbirth: Secondary | ICD-10-CM | POA: Diagnosis not present

## 2021-04-20 DIAGNOSIS — D252 Subserosal leiomyoma of uterus: Secondary | ICD-10-CM | POA: Diagnosis present

## 2021-04-20 DIAGNOSIS — O43122 Velamentous insertion of umbilical cord, second trimester: Secondary | ICD-10-CM | POA: Diagnosis present

## 2021-04-20 DIAGNOSIS — O09522 Supervision of elderly multigravida, second trimester: Secondary | ICD-10-CM | POA: Insufficient documentation

## 2021-04-20 DIAGNOSIS — O321XX0 Maternal care for breech presentation, not applicable or unspecified: Secondary | ICD-10-CM | POA: Diagnosis present

## 2021-04-20 DIAGNOSIS — D219 Benign neoplasm of connective and other soft tissue, unspecified: Secondary | ICD-10-CM | POA: Diagnosis present

## 2021-04-20 DIAGNOSIS — O139 Gestational [pregnancy-induced] hypertension without significant proteinuria, unspecified trimester: Secondary | ICD-10-CM | POA: Diagnosis present

## 2021-04-20 DIAGNOSIS — O9081 Anemia of the puerperium: Secondary | ICD-10-CM | POA: Diagnosis not present

## 2021-04-20 DIAGNOSIS — Z362 Encounter for other antenatal screening follow-up: Secondary | ICD-10-CM | POA: Diagnosis not present

## 2021-04-20 DIAGNOSIS — O358XX0 Maternal care for other (suspected) fetal abnormality and damage, not applicable or unspecified: Secondary | ICD-10-CM | POA: Diagnosis present

## 2021-04-20 DIAGNOSIS — E559 Vitamin D deficiency, unspecified: Secondary | ICD-10-CM | POA: Diagnosis present

## 2021-04-20 DIAGNOSIS — Z3A27 27 weeks gestation of pregnancy: Secondary | ICD-10-CM | POA: Diagnosis not present

## 2021-04-20 DIAGNOSIS — O1490 Unspecified pre-eclampsia, unspecified trimester: Secondary | ICD-10-CM | POA: Diagnosis not present

## 2021-04-20 DIAGNOSIS — O365921 Maternal care for other known or suspected poor fetal growth, second trimester, fetus 1: Secondary | ICD-10-CM

## 2021-04-20 DIAGNOSIS — O09529 Supervision of elderly multigravida, unspecified trimester: Secondary | ICD-10-CM

## 2021-04-20 DIAGNOSIS — O132 Gestational [pregnancy-induced] hypertension without significant proteinuria, second trimester: Secondary | ICD-10-CM | POA: Diagnosis not present

## 2021-04-20 LAB — COMPREHENSIVE METABOLIC PANEL
ALT: 28 U/L (ref 0–44)
AST: 27 U/L (ref 15–41)
Albumin: 2.9 g/dL — ABNORMAL LOW (ref 3.5–5.0)
Alkaline Phosphatase: 267 U/L — ABNORMAL HIGH (ref 38–126)
Anion gap: 8 (ref 5–15)
BUN: <5 mg/dL — ABNORMAL LOW (ref 6–20)
CO2: 22 mmol/L (ref 22–32)
Calcium: 9.1 mg/dL (ref 8.9–10.3)
Chloride: 104 mmol/L (ref 98–111)
Creatinine, Ser: 0.61 mg/dL (ref 0.44–1.00)
GFR, Estimated: >60 mL/min (ref >60)
Glucose, Bld: 81 mg/dL (ref 70–99)
Potassium: 4.1 mmol/L (ref 3.5–5.1)
Sodium: 134 mmol/L — ABNORMAL LOW (ref 135–145)
Total Bilirubin: 0.4 mg/dL (ref 0.3–1.2)
Total Protein: 6.8 g/dL (ref 6.5–8.1)

## 2021-04-20 LAB — CBC
HCT: 39.3 % (ref 36.0–46.0)
Hemoglobin: 13 g/dL (ref 12.0–15.0)
MCH: 29.1 pg (ref 26.0–34.0)
MCHC: 33.1 g/dL (ref 30.0–36.0)
MCV: 87.9 fL (ref 80.0–100.0)
Platelets: 245 10*3/uL (ref 150–400)
RBC: 4.47 MIL/uL (ref 3.87–5.11)
RDW: 14.1 % (ref 11.5–15.5)
WBC: 9.7 10*3/uL (ref 4.0–10.5)
nRBC: 0.2 % (ref 0.0–0.2)

## 2021-04-20 LAB — TYPE AND SCREEN
ABO/RH(D): A POS
Antibody Screen: NEGATIVE

## 2021-04-20 LAB — RESP PANEL BY RT-PCR (FLU A&B, COVID) ARPGX2
Influenza A by PCR: NEGATIVE
Influenza B by PCR: NEGATIVE
SARS Coronavirus 2 by RT PCR: NEGATIVE

## 2021-04-20 LAB — PROTEIN / CREATININE RATIO, URINE
Creatinine, Urine: 54.09 mg/dL
Protein Creatinine Ratio: 0.17 mg/mg{Cre} — ABNORMAL HIGH (ref 0.00–0.15)
Total Protein, Urine: 9 mg/dL

## 2021-04-20 MED ORDER — LACTATED RINGERS IV SOLN: INTRAVENOUS | Status: DC

## 2021-04-20 MED ORDER — MAGNESIUM SULFATE BOLUS VIA INFUSION
4.0000 g | Freq: Once | INTRAVENOUS | Status: AC
  Administered 2021-04-20: 4 g via INTRAVENOUS
  Filled 2021-04-20: qty 1000

## 2021-04-20 MED ORDER — ONDANSETRON HCL 4 MG/2ML IJ SOLN
4.0000 mg | Freq: Four times a day (QID) | INTRAMUSCULAR | Status: DC | PRN
  Administered 2021-04-20: 4 mg via INTRAVENOUS
  Filled 2021-04-20 (×2): qty 2

## 2021-04-20 MED ORDER — CALCIUM CARBONATE ANTACID 500 MG PO CHEW: 2.0000 | CHEWABLE_TABLET | ORAL | Status: DC | PRN

## 2021-04-20 MED ORDER — ZOLPIDEM TARTRATE 5 MG PO TABS: 5.0000 mg | ORAL_TABLET | Freq: Every evening | ORAL | Status: DC | PRN

## 2021-04-20 MED ORDER — MAGNESIUM SULFATE 4 GM/100ML IV SOLN: 4.0000 g | Freq: Once | INTRAVENOUS | Status: DC

## 2021-04-20 MED ORDER — PRENATAL MULTIVITAMIN CH
1.0000 | ORAL_TABLET | Freq: Every day | ORAL | Status: DC
  Administered 2021-04-20 – 2021-05-13 (×23): 1 via ORAL
  Filled 2021-04-20 (×23): qty 1

## 2021-04-20 MED ORDER — ACETAMINOPHEN 325 MG PO TABS
650.0000 mg | ORAL_TABLET | ORAL | Status: DC | PRN
  Administered 2021-04-20 – 2021-04-27 (×4): 650 mg via ORAL
  Filled 2021-04-20 (×4): qty 2

## 2021-04-20 MED ORDER — DOCUSATE SODIUM 100 MG PO CAPS
100.0000 mg | ORAL_CAPSULE | Freq: Every day | ORAL | Status: DC
  Administered 2021-04-21 – 2021-05-17 (×26): 100 mg via ORAL
  Filled 2021-04-20 (×27): qty 1

## 2021-04-20 MED ORDER — MAGNESIUM SULFATE IN D5W 1-5 GM/100ML-% IV SOLN: 1.0000 g | Freq: Once | INTRAVENOUS | Status: DC

## 2021-04-20 MED ORDER — BETAMETHASONE SOD PHOS & ACET 6 (3-3) MG/ML IJ SUSP
12.0000 mg | INTRAMUSCULAR | Status: AC
  Administered 2021-04-20 – 2021-04-21 (×2): 12 mg via INTRAMUSCULAR
  Filled 2021-04-20: qty 5

## 2021-04-20 MED ORDER — MAGNESIUM SULFATE 40 GM/1000ML IV SOLN
1.0000 g/h | INTRAVENOUS | Status: AC
  Filled 2021-04-20: qty 1000

## 2021-04-20 NOTE — H&P (Addendum)
OB ADMISSION/ HISTORY & PHYSICAL:  Admission Date: 04/20/2021  9:18 AM  Admit Diagnosis: IUGR @ 24.2 weeks with absent end-diastolic flow  Morgan Boyd is a 35 y.o. female [phone] [redacted]w[redacted]d presenting for severe IUGR@ 1st percentile, with intermittent reversed end-diastolic flow. Pt was seen by MFM today and recommended inpt care. Sent to hospital for betamethasone and MgSO4 for neuroprotection. Endorses active FM, denies LOF, ctx and vaginal bleeding.  History of current pregnancy: [id number]   Primary OB Provider: [facility] Patient entered care with [facility] at 9.1 wks.   EDC 08/08/21 by [facility]01/12/22 and congruent w/ 9 wk U/S.   Anatomy scan:  20.5 wks, with echogenic foci seen superior to fetal stomach with calcifications. Lagging FL 9% w/ postior placenta, marginal cord insertion.  Last evaluation: 04/20/21 @ 24.2  wks with MFM Significant prenatal events: Severe IUGR Patient Active Problem List   Diagnosis Date Noted   Autoimmune urticaria 06/29/2019    Prenatal Labs: ABO, Rh: --/--/A POS (07/01 1015) Antibody: NEG (07/01 1015) Rubella:   Immune RPR:   Negative HBsAg:   negative HIV:   negative GTT: HgA1C 5.4% GBS:   unknown GC/CHL: negative/negative Genetics: Low risk female Tdap/influenza vaccines:  Declines   OB History  Gravida Para Term Preterm AB Living  5 2 2   2 2   SAB IAB Ectopic Multiple Live Births  1 1     2     # Outcome Date GA Lbr Len/2nd Weight Sex Delivery Anes PTL Lv  5 Current           4 IAB           3 SAB           2 Term           1 Term             Medical / Surgical History: Past medical history:  Past Medical History:  Diagnosis Date   Allergy    Bilateral ovarian cysts    Bronchitis    Fibroid    Vitamin D deficiency     Past surgical history:  Past Surgical History:  Procedure Laterality Date   DILATION AND CURETTAGE OF UTERUS  2010   TONSILLECTOMY     Family History:  Family History  Problem Relation Age of Onset   Cancer Mother     Hypertension Father    Cancer Paternal Aunt    Prostate cancer Maternal Grandfather    Cancer Other     Social History:  reports that she has never smoked. She has never used smokeless tobacco. She reports current alcohol use. She reports that she does not use drugs.  Allergies: Sulfa antibiotics   Current Medications at time of admission:  Prior to Admission medications   Medication Sig Start Date End Date Taking? Authorizing Provider  albuterol (VENTOLIN HFA) 108 (90 Base) MCG/ACT inhaler Inhale 2 puffs into the lungs every 6 (six) hours as needed for wheezing or shortness of breath. Patient not taking: Reported on 04/18/2021 12/28/19   [name], MD  cetirizine (ZYRTEC) 10 MG tablet Take 10 mg by mouth daily.    [provider]  omalizumab Arvid Right) 150 MG injection Inject 300 mg into the skin every 28 (twenty-eight) days. Patient not taking: Reported on 12/07/2020 05/23/20   [name], MD  Prenatal Vit-Fe Fumarate-FA (PRENATAL VITAMINS PO) Take by mouth.    [provider]    Review of Systems: Constitutional: Negative  HENT: Negative   Eyes: Negative   Respiratory: Negative   Cardiovascular: Negative   Gastrointestinal: Negative  Genitourinary: negative for bloody show, negative for LOF   Musculoskeletal: Negative   Skin: Negative   Neurological: Negative   Endo/Heme/Allergies: Negative   Psychiatric/Behavioral: Negative    Physical Exam: VS: Blood pressure (!) 158/97, pulse 95, temperature 98.5 F (36.9 C), temperature source Oral, resp. rate 18, height 5\' 3"  (1.6 m), weight 102.1 kg, last menstrual period 11/01/2020, SpO2 98 %. AAO x3, no signs of distress Cardiovascular: RRR Respiratory: Lung fields clear to ausculation GU/GI: Abdomen gravid, non-tender, non-distended Extremities: negative edema, negative for pain, tenderness, and cords  Cervical exam: deferred FHR: baseline rate 145 / variability moderate  TOCO:  none   Prenatal Transfer Tool  Maternal Diabetes: No Genetic Screening: Normal Maternal Ultrasounds/Referrals: IUGR Fetal Ultrasounds or other Referrals:  Referred to Materal Fetal Medicine  Maternal Substance Abuse:  No Significant Maternal Medications:  None Significant Maternal Lab Results: None GBS unknown  Assessment: 35 y.o. [id number] [redacted]w[redacted]d admitted for sever IUGR @ 1% intermittent reversed end-diastolic flow.  FHR category 1 GBS unknown  Plan:  Admit to [facility] Routine antenatal admission orders Betamethasone 1st dose 04/20/21@1058 , repeat on 04/21/21 Magnesium sulfate 4gm IVB then 1gm/hr for neuroprotection NICU consult CBC, CMP, PCR Regular diet Continuous FHR monitoring   Collaborated with Dr Alwyn Pea on  admission and plan of care  Domingo Pulse MSN, CNM 04/20/2021 11:49 AM

## 2021-04-20 NOTE — Progress Notes (Addendum)
MFM Note  Morgan Boyd was seen for a follow-up exam as an IUGR fetus with absent end-diastolic flow was noted on umbilical artery Doppler studies along with echogenic bowel during her exam two days ago.  The estimated fetal weight was 475 g (1 pound 1 ounces, 1st percentile for her gestational age).  Her pregnancy has also been complicated by advanced maternal age and a fibroid uterus.  Due to the echogenic bowel, she had CMV and toxoplasmosis titers drawn that did not show an acute infection.  She reports feeling fetal movements.  On today's exam, normal amniotic fluid was noted.  Fetal movements were also noted throughout today's exam.  Doppler studies of the umbilical arteries performed today shows intermittent reversed end-diastolic flow.  A reversed A wave was also noted on Doppler studies of the ductus venosus.  The patient and her husband were advised regarding the increased risk of a fetal demise due to today's ultrasound findings.    As the fetus is of a viable gestational age and weight, she was sent to the hospital for admission and to be placed on continuous monitoring.    She should receive a complete course of antenatal corticosteroids, be started on magnesium for fetal neuroprotection, and have a NICU consult.    Delivery is recommended at any time for nonreassuring fetal status.  The patient understands that at her current gestational age, she is unlikely to have a successful induction and that most growth restricted fetuses with extremely abnormal umbilical artery Doppler studies will not tolerate the labor process.  Therefore, she will most likely require a cesarean delivery.  If the fetal status remains reassuring after she receives a complete course of antenatal corticosteroids, I would recommend continued inpatient management with daily fetal testing until delivery. Twice weekly umbilical artery Doppler studies is also recommended.  The patient and her husband understands  that I anticipate that she will most likely require delivery within the next 7 to 10 days.  At her current gestational age, each day that the fetus remains undelivered may provide additional fetal benefits.    The patient and her husband stated that all of their questions have been answered to their complete satisfaction.  A total of 25 minutes was spent counseling and coordinating the care for this patient.  Greater than 50% of the time was spent in direct face-to-face contact.

## 2021-04-21 MED ORDER — VITAMIN D 25 MCG (1000 UNIT) PO TABS
1000.0000 [IU] | ORAL_TABLET | Freq: Every day | ORAL | Status: DC
  Administered 2021-04-21 – 2021-04-23 (×3): 1000 [IU] via ORAL
  Filled 2021-04-21 (×3): qty 1

## 2021-04-21 NOTE — Progress Notes (Addendum)
Pt without complaints.  No leakage of fluid or VB.  Good FM  BP 134/74 (BP Location: Right Arm)   Pulse 76   Temp 98 F (36.7 C) (Oral)   Resp 17   Ht 5\' 3"  (1.6 m)   Wt 102.1 kg   LMP 11/01/2020   SpO2 97%   BMI 39.86 kg/m   FHTS Variability: Good {> 6 bpm) and Accelerations: Non-reactive but appropriate for gestational age  37 none  Pt in NAD CV RRR Lungs CTAB abd  Gravid soft and NT GU no vb EXt no calf tenderness Results for orders placed or performed during the hospital encounter of 04/20/21 (from the past 72 hour(s))  Resp Panel by RT-PCR (Flu A&B, Covid) Nasopharyngeal Swab     Status: None   Collection Time: 04/20/21  9:58 AM   Specimen: Nasopharyngeal Swab; Nasopharyngeal(NP) swabs in vial transport medium  Result Value Ref Range   SARS Coronavirus 2 by RT PCR NEGATIVE NEGATIVE    Comment: (NOTE) SARS-CoV-2 target nucleic acids are NOT DETECTED.  The SARS-CoV-2 RNA is generally detectable in upper respiratory specimens during the acute phase of infection. The lowest concentration of SARS-CoV-2 viral copies this assay can detect is 138 copies/mL. A negative result does not preclude SARS-Cov-2 infection and should not be used as the sole basis for treatment or other patient management decisions. A negative result may occur with  improper specimen collection/handling, submission of specimen other than nasopharyngeal swab, presence of viral mutation(s) within the areas targeted by this assay, and inadequate number of viral copies(<138 copies/mL). A negative result must be combined with clinical observations, patient history, and epidemiological information. The expected result is Negative.  Fact Sheet for Patients:  EntrepreneurPulse.com.au  Fact Sheet for Healthcare Providers:  IncredibleEmployment.be  This test is no t yet approved or cleared by the [place] FDA and  has been authorized for detection and/or  diagnosis of SARS-CoV-2 by FDA under an Emergency Use Authorization (EUA). This EUA will remain  in effect (meaning this test can be used) for the duration of the COVID-19 declaration under Section 564(b)(1) of the Act, 21 U.S.C.section 360bbb-3(b)(1), unless the authorization is terminated  or revoked sooner.       Influenza A by PCR NEGATIVE NEGATIVE   Influenza B by PCR NEGATIVE NEGATIVE    Comment: (NOTE) The Xpert Xpress SARS-CoV-2/FLU/RSV plus assay is intended as an aid in the diagnosis of influenza from Nasopharyngeal swab specimens and should not be used as a sole basis for treatment. Nasal washings and aspirates are unacceptable for Xpert Xpress SARS-CoV-2/FLU/RSV testing.  Fact Sheet for Patients: EntrepreneurPulse.com.au  Fact Sheet for Healthcare Providers: IncredibleEmployment.be  This test is not yet approved or cleared by the [place] FDA and has been authorized for detection and/or diagnosis of SARS-CoV-2 by FDA under an Emergency Use Authorization (EUA). This EUA will remain in effect (meaning this test can be used) for the duration of the COVID-19 declaration under Section 564(b)(1) of the Act, 21 U.S.C. section 360bbb-3(b)(1), unless the authorization is terminated or revoked.  Performed at [facility], [place] [address], [place], [place] [address]   Type and screen [place]     Status: None   Collection Time: 04/20/21 10:15 AM  Result Value Ref Range   ABO/RH(D) A POS    Antibody Screen NEG    Sample Expiration      04/23/2021,2359 Performed at [facility], [place] [address], [place], [place]  [address]   Comprehensive metabolic panel     Status: Abnormal   Collection Time: 04/20/21 10:15 AM  Result Value Ref Range   Sodium 134 (L) 135 - 145 mmol/L   Potassium 4.1 3.5 - 5.1 mmol/L   Chloride 104 98 - 111 mmol/L   CO2 22 22 - 32 mmol/L   Glucose, Bld 81 70 - 99 mg/dL    Comment:  Glucose reference range applies only to samples taken after fasting for at least 8 hours.   BUN <5 (L) 6 - 20 mg/dL   Creatinine, Ser 0.61 0.44 - 1.00 mg/dL   Calcium 9.1 8.9 - 10.3 mg/dL   Total Protein 6.8 6.5 - 8.1 g/dL   Albumin 2.9 (L) 3.5 - 5.0 g/dL   AST 27 15 - 41 U/L   ALT 28 0 - 44 U/L   Alkaline Phosphatase 267 (H) 38 - 126 U/L   Total Bilirubin 0.4 0.3 - 1.2 mg/dL   GFR, Estimated >60 >60 mL/min    Comment: (NOTE) Calculated using the CKD-EPI Creatinine Equation (2021)    Anion gap 8 5 - 15    Comment: Performed at [facility] [address] [place], [place] [address]  CBC on admission     Status: None   Collection Time: 04/20/21 10:15 AM  Result Value Ref Range   WBC 9.7 4.0 - 10.5 K/uL   RBC 4.47 3.87 - 5.11 MIL/uL   Hemoglobin 13.0 12.0 - 15.0 g/dL   HCT 39.3 36.0 - 46.0 %   MCV 87.9 80.0 - 100.0 fL   MCH 29.1 26.0 - 34.0 pg   MCHC 33.1 30.0 - 36.0 g/dL   RDW 14.1 11.5 - 15.5 %   Platelets 245 150 - 400 K/uL   nRBC 0.2 0.0 - 0.2 %    Comment: Performed at [facility], [place] [address] [place], [place] [address]  Protein / creatinine ratio, urine     Status: Abnormal   Collection Time: 04/20/21 12:37 PM  Result Value Ref Range   Creatinine, Urine 54.09 mg/dL   Total Protein, Urine 9 mg/dL    Comment: NO NORMAL RANGE ESTABLISHED FOR THIS TEST   Protein Creatinine Ratio 0.17 (H) 0.00 - 0.15 mg/mg[Cre]    Comment: Performed at [facility], [place] [address] [place], [place] [address]    Assessment and Plan [redacted]w[redacted]d  IUGR PT stable  Stop Magnesium Monitor very closely  NICU consult today  Fetus reassuring.  If delivery is needed pt understands she may need a CS and possibly classical  and all risks were discussed.   Gestational HTN.  All labs WNL BPs WNL on bedrest

## 2021-04-21 NOTE — Consult Note (Signed)
Neonatal Medicine  04/21/2021 2:55 PM  Morgan Boyd [id number]  I was asked by [facility] team to consult on this patient for possible periviable preterm delivery.  I met with Morgan Boyd in person as her husband via cellular phone.  Morgan Boyd was admitted yesterday to [facility] due to IUGR of her fetus who is at [redacted] weeks gestation.     Background This is a female fetus currently at [redacted]w[redacted]d  He is growing at the 1st % and has intermittent reversed end-diastolic flow.  Good prenatal care.  Marginal cord.  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   Counseling We discussed the overall survival rate at this gestational age, which although greater than 50%, is still associated with significant neurologic impairment in many cases.  We discussed the expected hospital length of stay and the significant problems associated with extreme prematurity including respiratory distress syndrome/CLD, IVH/PVL, ROP, NEC, and infection risk. I explained that should they choose critical care interventions, the infant would be intubated immediately in the delivery room.  She understands that despite maximum critical care interventions, her baby may encounter complications that he/she may not be able to survive.  She also understands that if the baby does survive, he/she may have life-long medical complexities such as blindness, cerebral palsy, and significant developmental delay.   Given the above information, I explained that there is joint medical decision making for infant's born 22-[redacted] weeks gestation and that there are three options should she delivery prior to 25 weeks   1. Provide maximum critical care intervention, utilizing every measure possible to save their baby's life.   2. Do not initiate critical care, and instead provide comfort care until the baby passes, allowing parents to hold if they desire.   3. Initiate intensive care and see how her baby responds, being open to the idea of with-drawling care should  significant complications arise.      Plan Morgan Boyd her partner are currently processing all of the information, and may have additional questions for u[place]oreain the coming days.  At this point, the mother expressed a desire to provide critical care to her baby.    Thank you for involving uKoreain the care of this patient.  Please contact the neonatologist at [phone] needed.   Time for consultation was approximately >40 minutes, of which more than half was face-to-face.   _____________________ MBerenice Bouton MD Neonatal Medicine 04/21/2021             2:55 PM

## 2021-04-21 NOTE — Progress Notes (Signed)
EZELL MELIKIAN is a 35 y.o. [id number] at [redacted]w[redacted]d by LMP admitted for  severe IUGR with intermittent reversed end-diastolic  flow.   Subjective:  Jacklynn is resting comfortably. MgSO4 discontinued after 24 hours. 2nd dose of betamethasone received. Reports good FM. NST reassuring for 24 wker. Questions answered, support given. NICU to come for consult this afternoon.  Objective: Vitals:   04/21/21 1039 04/21/21 1100 04/21/21 1107 04/21/21 1108  BP:    134/74  Pulse:    76  Resp: 18 17    Temp:   98 F (36.7 C)   TempSrc:   Oral   SpO2:   97%   Weight:      Height:        I/O last 3 completed shifts: In: 4362.2 [P.O.:2380; I.V.:1982.2] Out: 4000 [Urine:4000]  FHT - 140bpm, negative decelerations   Labs:   Recent Labs    04/20/21 1015  WBC 9.7  HGB 13.0  HCT 39.3  PLT 245    Assessment / Plan: 35 y.o. AGP@ [redacted]w[redacted]d admitted to [facility] for severe IUGR with intermittent reversed end-diastolic flow.  MgSO4 4gmIVB the 1 gm/hr x 24 hours complete BMZ x 2 complete NICU consult today Vitamin D Dr Charlesetta Garibaldi aware of pt status and [place] MSN, CNM 04/21/2021, 12:54 PM

## 2021-04-21 NOTE — Progress Notes (Signed)
   04/21/21 1731  Clinical Encounter Type  Visited With Patient  Visit Type Spiritual support  Referral From Nurse  Consult/Referral To Chaplain   Chaplain responded to page and spoke with nurse Danelle Berry. Chaplain actively listens to the patient spoke of her concerns about premature labor. She stated her son is due in October and she must remain on best rest. She has two other kids who miss her dearly. She spoke about her 62 yr. old son missing her and calls 5x's a day. She is disappointed that the unit will not allow her mother to visit who lives 5hrs away. She would like her mother and mother-in-law to visit. Chaplain shared creative idea. Since her [place] near the roundabout, she could have her husband drive by, and they could see her through the window. The patient was excited about speaking with her husband to relay the idea. The chaplain read [id number] and prayed.  Patient also excited to hear that the chaplain's hometown is near where she was born and raised. Her husband called and we ended our visit, but she asked if the chaplain could return. Advised her to have nurse to page when she wants a visit.  This note was prepared by Jeanine Luz, M.Div..  For questions please contact by phone [phone].

## 2021-04-22 MED ORDER — CYCLOBENZAPRINE HCL 10 MG PO TABS: 5.0000 mg | ORAL_TABLET | Freq: Three times a day (TID) | ORAL | Status: DC | PRN

## 2021-04-22 NOTE — Progress Notes (Addendum)
LOS: Day 2 TOWANA STENGLEIN 35 y.o. [id number] [redacted]w[redacted]d Admitted for IUGR fetus with absent end-diastolic flow  S: Reports a restless night R/T discomfort in bed. Pt may shower and have egg crate mattress topper applied for comfort. Endorses active FM. Questions RE: POC answered. Pt remains in good spirits.   O:  Vitals with BMI 04/22/2021 04/22/2021 04/21/2021  Height - - -  Weight - - -  BMI - - -  Systolic 410 301 314  Diastolic 77 71 72  Pulse - 70 76   FHR baseline 145/ moderate variability/ 10x10 accels/ no decels SVE deferred  Gen: AAO x3 CV: RRR, no additional sounds Resp: clear, unlabored GI: soft, non tender, gravid GU: neg for vaginal bleeding and neg for LOF Extrem: neg edema, pain, tenderness, or cords  A/P 35 y.o. [id number] [redacted]w[redacted]d  IUGR     S/P magnesium IV for neuro protection    Antenatal steroids complete 7/3    NICU consult complete    Continuous fetal monitoring, FHR appropriate for gest age    EFW 1# 1 oz (475 G)  Fetus w/ echogenic bowel    -CMV and toxoplasmosis negative    -Consider Horizon NIPS test for CF as recommended by Dr. Massie Maroon    -normotensive to mild range pressures over the past 24 hrs    -protein creatinine ratio 0.17 on admission  Plan developed w/ Dr. Charlesetta Garibaldi, will discuss next [place] and doppler studies  Burman Foster, MSN, CNM 04/22/2021 8:50 AM

## 2021-04-23 DIAGNOSIS — E559 Vitamin D deficiency, unspecified: Secondary | ICD-10-CM | POA: Diagnosis present

## 2021-04-23 DIAGNOSIS — O139 Gestational [pregnancy-induced] hypertension without significant proteinuria, unspecified trimester: Secondary | ICD-10-CM | POA: Diagnosis present

## 2021-04-23 DIAGNOSIS — D219 Benign neoplasm of connective and other soft tissue, unspecified: Secondary | ICD-10-CM | POA: Diagnosis present

## 2021-04-23 MED ORDER — VITAMIN D 25 MCG (1000 UNIT) PO TABS
4000.0000 [IU] | ORAL_TABLET | Freq: Every day | ORAL | Status: DC
  Administered 2021-04-24 – 2021-05-17 (×23): 4000 [IU] via ORAL
  Filled 2021-04-23 (×25): qty 4

## 2021-04-23 NOTE — Progress Notes (Addendum)
Hospital day # 3 [redacted]w[redacted]d with Severe IOL on 7/1 at 24.2 weeks, <1% with GHTN, h/o fibroid.  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 Pt plans on seeing family at 5 days of stay.       Perception of contractions: none      Vaginal bleeding: none now       Vaginal discharge:  no significant change  O: BP (!) 141/76 (BP Location: Right Arm)   Pulse 85   Temp 98.1 F (36.7 C) (Oral)   Resp 16   Ht 5\' 3"  (1.6 m)   Wt 102.1 kg   LMP 11/01/2020   SpO2 96%   BMI 39.86 kg/m       Fetal tracings: Baseline 150s moderate variability, +acel 10x10, -decel, reactive for gestational age.       Contractions:   None      Uterus gravid and non-tender      Extremities: extremities normal, atraumatic, no cyanosis or edema, Homans sign is negative, no sign of DVT, no edema, redness or tenderness in the calves or thighs, and no significant edema and no signs of DVT       A/P: [redacted]w[redacted]d with Severe IOL on 7/1 at 24.2 weeks, <1% with GHTN, h/o fibroid.  LOS# 3  Severe IUGR: Stable, s/p 24 hours magnesium, NST reactive for gestational age, [place] Repeat US on 7/1 was cephalic,  anterior placenta, AFI WNL, doppler on 7/1 showed absent end diastolic flow, but MFM document intermittent reversed flow with Dopplers on 7/1, EFW 1.1lbs <1% with echogenic bowels, cmv and toxo neg, panoroma LR female, declined amniocentesis plan for Horizon testing to r/u CF, plan to repeat bi-weekly, Repeat tomorrow. Continuous monitoring. repeat T&S and Horizon in the morning. BMZ completed 7/1-7/2. MFM stated " Delivery is recommended at any time for nonreassuring fetal status.  The patient understands that at her current gestational age, she is unlikely to have a successful induction and that most growth restricted fetuses with extremely abnormal umbilical artery Doppler studies will not tolerate the labor process.  Therefore, she will most likely require a cesarean delivery. If the fetal status remains reassuring after she receives a complete course of antenatal corticosteroids, I would recommend continued inpatient management with daily fetal testing until delivery. The patient and her husband understands that I anticipate that she will most likely require delivery within the next 7 to 10 days.  At her current gestational age, each day that the fetus remains undelivered may provide additional fetal benefits. The patient and her husband stated that all of their questions have been answered  to their complete satisfaction." Plan for [facility] if delivery needed, also plan form  magnesium for neuroprotectant. NICU consult complete.   GHTN: Pt stable, asymptomatic, BP 133/83, no meds, PCR 0.17 upon admission, unremarkable other labs.   Vit D Deficiency: Stable, on 1000IUn vit D, increased to 4000IU recommended during pregnancy. Will repeat Vit D in the morning.     [facility] CNM, MSN 04/23/2021 3:13 PM

## 2021-04-24 ENCOUNTER — Ambulatory Visit

## 2021-04-24 ENCOUNTER — Other Ambulatory Visit

## 2021-04-24 ENCOUNTER — Inpatient Hospital Stay (HOSPITAL_BASED_OUTPATIENT_CLINIC_OR_DEPARTMENT_OTHER)

## 2021-04-24 DIAGNOSIS — O09522 Supervision of elderly multigravida, second trimester: Secondary | ICD-10-CM | POA: Diagnosis not present

## 2021-04-24 DIAGNOSIS — O43192 Other malformation of placenta, second trimester: Secondary | ICD-10-CM

## 2021-04-24 DIAGNOSIS — O365920 Maternal care for other known or suspected poor fetal growth, second trimester, not applicable or unspecified: Secondary | ICD-10-CM | POA: Diagnosis not present

## 2021-04-24 DIAGNOSIS — O283 Abnormal ultrasonic finding on antenatal screening of mother: Secondary | ICD-10-CM | POA: Diagnosis not present

## 2021-04-24 DIAGNOSIS — D259 Leiomyoma of uterus, unspecified: Secondary | ICD-10-CM

## 2021-04-24 DIAGNOSIS — O99212 Obesity complicating pregnancy, second trimester: Secondary | ICD-10-CM

## 2021-04-24 DIAGNOSIS — Z3A24 24 weeks gestation of pregnancy: Secondary | ICD-10-CM

## 2021-04-24 DIAGNOSIS — O3412 Maternal care for benign tumor of corpus uteri, second trimester: Secondary | ICD-10-CM

## 2021-04-24 DIAGNOSIS — E669 Obesity, unspecified: Secondary | ICD-10-CM

## 2021-04-24 DIAGNOSIS — O321XX0 Maternal care for breech presentation, not applicable or unspecified: Secondary | ICD-10-CM

## 2021-04-24 LAB — COMPREHENSIVE METABOLIC PANEL
ALT: 22 U/L (ref 0–44)
AST: 19 U/L (ref 15–41)
Albumin: 2.6 g/dL — ABNORMAL LOW (ref 3.5–5.0)
Alkaline Phosphatase: 212 U/L — ABNORMAL HIGH (ref 38–126)
Anion gap: 8 (ref 5–15)
BUN: 7 mg/dL (ref 6–20)
CO2: 21 mmol/L — ABNORMAL LOW (ref 22–32)
Calcium: 8.7 mg/dL — ABNORMAL LOW (ref 8.9–10.3)
Chloride: 105 mmol/L (ref 98–111)
Creatinine, Ser: 0.57 mg/dL (ref 0.44–1.00)
GFR, Estimated: >60 mL/min (ref >60)
Glucose, Bld: 85 mg/dL (ref 70–99)
Potassium: 3.8 mmol/L (ref 3.5–5.1)
Sodium: 134 mmol/L — ABNORMAL LOW (ref 135–145)
Total Bilirubin: 0.6 mg/dL (ref 0.3–1.2)
Total Protein: 5.9 g/dL — ABNORMAL LOW (ref 6.5–8.1)

## 2021-04-24 LAB — CBC WITH DIFFERENTIAL/PLATELET
Abs Immature Granulocytes: 0.14 10*3/uL — ABNORMAL HIGH (ref 0.00–0.07)
Basophils Absolute: 0 10*3/uL (ref 0.0–0.1)
Basophils Relative: 0 %
Eosinophils Absolute: 0.2 10*3/uL (ref 0.0–0.5)
Eosinophils Relative: 1 %
HCT: 38.4 % (ref 36.0–46.0)
Hemoglobin: 12.7 g/dL (ref 12.0–15.0)
Immature Granulocytes: 1 %
Lymphocytes Relative: 27 %
Lymphs Abs: 3.3 10*3/uL (ref 0.7–4.0)
MCH: 28.8 pg (ref 26.0–34.0)
MCHC: 33.1 g/dL (ref 30.0–36.0)
MCV: 87.1 fL (ref 80.0–100.0)
Monocytes Absolute: 0.9 10*3/uL (ref 0.1–1.0)
Monocytes Relative: 7 %
Neutro Abs: 7.7 10*3/uL (ref 1.7–7.7)
Neutrophils Relative %: 64 %
Platelets: 233 10*3/uL (ref 150–400)
RBC: 4.41 MIL/uL (ref 3.87–5.11)
RDW: 14.2 % (ref 11.5–15.5)
WBC: 12.2 10*3/uL — ABNORMAL HIGH (ref 4.0–10.5)
nRBC: 0.9 % — ABNORMAL HIGH (ref 0.0–0.2)

## 2021-04-24 LAB — VITAMIN D 25 HYDROXY (VIT D DEFICIENCY, FRACTURES): Vit D, 25-Hydroxy: 34.19 ng/mL (ref 30–100)

## 2021-04-24 LAB — TYPE AND SCREEN
ABO/RH(D): A POS
Antibody Screen: NEGATIVE

## 2021-04-24 MED ORDER — HYDRALAZINE HCL 20 MG/ML IJ SOLN: 10.0000 mg | INTRAMUSCULAR | Status: DC | PRN

## 2021-04-24 MED ORDER — LACTATED RINGERS IV BOLUS
250.0000 mL | Freq: Once | INTRAVENOUS | Status: AC
  Administered 2021-04-24: 250 mL via INTRAVENOUS

## 2021-04-24 MED ORDER — LABETALOL HCL 5 MG/ML IV SOLN
40.0000 mg | INTRAVENOUS | Status: DC | PRN
  Filled 2021-04-24: qty 8

## 2021-04-24 MED ORDER — LABETALOL HCL 5 MG/ML IV SOLN
20.0000 mg | INTRAVENOUS | Status: DC | PRN
Start: 2021-04-24 — End: 2021-05-17
  Administered 2021-04-24: 20 mg via INTRAVENOUS

## 2021-04-24 MED ORDER — LABETALOL HCL 5 MG/ML IV SOLN
80.0000 mg | INTRAVENOUS | Status: DC | PRN
Start: 2021-04-24 — End: 2021-05-17

## 2021-04-24 NOTE — Progress Notes (Addendum)
Hospital day # 4 [redacted]w[redacted]d admitted with Severe IUGR on 7/1 at 24.2 weeks, <1%, intermittent reversed end diastolic flow, echogenic bowel, GHTN, h/o fibroid, vit D deficiency.   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 Anxious about ultrasound with dopplers today. Reports good fetal movement, which is reassuring to her. No complaint. "Just grateful for one more day".   O: BP (!) 141/82 (BP Location: Right Arm)   Pulse 72   Temp 98.5 F (36.9 C) (Oral)   Resp 16   Ht 5\' 3"  (1.6 m)   Wt 102.1 kg   LMP 11/01/2020   SpO2 97%   BMI 39.86 kg/m        Fetal tracings: Baseline 150s minimal to moderate variability. No accelerations,  No variables at present.      Contractions:   None      Uterus gravid and non-tender      Extremities: extremities normal, atraumatic, no cyanosis or edema, Homans sign is negative, no sign of DVT, no edema, redness or tenderness in the calves or thighs, and no significant edema and no signs of DVT         A/P: [redacted]w[redacted]d with Severe IUGR with intermittent reversed diastolic flow and echogenic bowel on 7/1 at 24.2 weeks, <1% with GHTN, h/o fibroid.   LOS# 4   Severe IUGR: Stable, s/p 24 hours magnesium, NST reactive for gestational age, [place] Repeat US on 7/1 was cephalic, anterior placenta, AFI WNL, doppler on 7/1 showed absent end  diastolic flow, but MFM document intermittent reversed flow with Dopplers on 7/1, EFW 1.1lbs <1% with echogenic bowels, cmv and toxo neg, panoroma LR female, declined amniocentesis, CF pending, plan to repeat bi-weekly, Repeat u/s with dopplers today. Continuous monitoring. BMZ completed 7/1-7/2. MFM stated " Delivery is recommended at any time for nonreassuring fetal status.  The patient understands that at her current gestational age, she is unlikely to have a successful induction and that most growth restricted fetuses with extremely abnormal umbilical artery Doppler studies will not tolerate the labor process.  Therefore, she will most likely require a cesarean delivery. If the fetal status remains reassuring after she receives a complete course of antenatal corticosteroids, I would recommend continued inpatient management with daily fetal testing until delivery. The patient and her husband understands that I anticipate that she will most likely require delivery within the next 7 to 10 days.  At her current gestational age, each day that the fetus remains undelivered may provide additional fetal benefits. The patient and her husband stated that all of their questions have been answered to their complete satisfaction." Plan for [facility] if delivery needed, also plan  form  magnesium for neuroprotectant. NICU consult complete.   GHTN: Pt stable, asymptomatic, BP 141/82 no meds, PCR 0.17 upon admission, unremarkable other labs.   Vit D Deficiency: Stable, on 4000IU recommended during pregnancy.  Dr Alesia Richards aware of pt status and [place], MSN  04/24/21@1111am    MD Addendum:  I saw and examined patient at bedside on 04/24/2021 and agree with above findings, assessment and plan as outlined above by CNM.  NST on 04/24/2021 also reviewed  at 10 am: 150 BL, moderate variability, few variable decelerations, reactive by 10 x 10 criteria.  TOCO: No contractions.   Dr. Alesia Richards. 04/25/2021.

## 2021-04-25 MED ORDER — ASPIRIN 81 MG PO CHEW
81.0000 mg | CHEWABLE_TABLET | Freq: Every day | ORAL | Status: DC
  Administered 2021-04-26 – 2021-05-13 (×18): 81 mg via ORAL
  Filled 2021-04-25 (×18): qty 1

## 2021-04-25 NOTE — Progress Notes (Signed)
Follow up visit with Morgan Boyd. Pt expressed gratitude that she was able to see her children Nasaya and Cristie Hem, and her husband Regino Schultze today. They spent a long time together and Morgan Boyd reports the visit was encouraging. She also learned that [place] scans did not show any more changes in his cord or placenta, which Verniece hopes will give her more ability to continue to surrender control to God. 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  Please page as further needs arise.  Donald Prose. Elyn Peers, M.Div. [facility] Chaplain Pager [phone] Office [phone]

## 2021-04-25 NOTE — Progress Notes (Signed)
Chaplain follow up visit up on referral from Pt RN. Pt would like daily visits if possible. Chaplain asked open ended questions to facilitate emotional expression and offered support. Pt identified sources of support and hope in her life. She shared that she is working hard to surrender control to God and shared the story of learning that her baby, Jacobo Forest, had a cord/placenta anomaly.  Upon reflection, Kaziyah is able to see God's hand in the timing and is grateful to be able to support her baby by being in the hospital, but is missing her children. Pt identified difficulty in being a "passive" participant in a process that is significantly impacting her family and chaplain reminded her of the ways she is actively caring for her son by being in the hospital. Pt requested prayer and scripture. Chaplain read Psalm 91 and prayed. Chaplain also brought patient a prayer shawl.  Please page as further needs arise.  Donald Prose. Elyn Peers, M.Div. [facility] Chaplain Pager [phone] Office [phone]

## 2021-04-25 NOTE — Progress Notes (Addendum)
LOS: Day #5 LAN ENTSMINGER 35 y.o. [id number] 25w 0d Admitted for Severe IUGR on 7/1 at 24.2 weeks, <1%, intermittent reversed end diastolic flow, echogenic bowel, GHTN, h/o fibroid, vit D deficiency.  S:  Excited for her other 2 children to visit today. Reported a more restful night and is adjusting to hospital stay. Up ad lib, performs self care independently, reports active FM. Denies HA, visual changes, and RUQ pain. Answered questions and discussed POC.   O: Vitals with BMI 04/25/2021 04/25/2021 04/25/2021  Height - - -  Weight - - -  BMI - - -  Systolic 841 324 401  Diastolic 84 89 87  Pulse 90 93 93   Phys exam: FHR baseline 145/ moderate variability/ 10x10 accels/ no decels SVE deferred   Gen: AAO x3 CV: RRR, no additional sounds Resp: clear, unlabored GI: soft, non tender, gravid GU: neg for vaginal bleeding and neg for LOF Extrem: neg edema, pain, tenderness, or cords  A/P:  IUGR    S/P magnesium IV for neuro protection    Antenatal steroids complete 7/3    NICU consult complete    [place] and Doppler on 7/5-Breech/ anterior placenta/ AFI WNL/    intermittent reversed end-diastolic flow    Doppler studies of the ductus venosus continues to show a          normal A wave, indicating that a fetal demise is not imminent    FHR appropriate for gest age    May modify fetal monitoring from continuous to TID x 30 min    Twice weekly [place] and doppler studies on Tue and Fri    Last EFW 1# 1 oz (475 G) on 04/20/21   Fetus w/ echogenic bowel    -CMV and toxoplasmosis negative    -Cystic fibrosis results pending   GHTN    -pressures increasing, severe range BP responded to IV antihypertensives, repeat CMP and urine PCR    -adding baby aspirin 81 mg PO daily  Plan reviewed w/ Dr/ Mancel Bale.    Burman Foster, MSN, CNM 04/25/2021 11:10 AM

## 2021-04-26 LAB — COMPREHENSIVE METABOLIC PANEL
ALT: 17 U/L (ref 0–44)
AST: 18 U/L (ref 15–41)
Albumin: 2.6 g/dL — ABNORMAL LOW (ref 3.5–5.0)
Alkaline Phosphatase: 253 U/L — ABNORMAL HIGH (ref 38–126)
Anion gap: 7 (ref 5–15)
BUN: 8 mg/dL (ref 6–20)
CO2: 21 mmol/L — ABNORMAL LOW (ref 22–32)
Calcium: 9.1 mg/dL (ref 8.9–10.3)
Chloride: 104 mmol/L (ref 98–111)
Creatinine, Ser: 0.59 mg/dL (ref 0.44–1.00)
GFR, Estimated: >60 mL/min (ref >60)
Glucose, Bld: 111 mg/dL — ABNORMAL HIGH (ref 70–99)
Potassium: 4.3 mmol/L (ref 3.5–5.1)
Sodium: 132 mmol/L — ABNORMAL LOW (ref 135–145)
Total Bilirubin: 0.3 mg/dL (ref 0.3–1.2)
Total Protein: 5.9 g/dL — ABNORMAL LOW (ref 6.5–8.1)

## 2021-04-26 LAB — PROTEIN / CREATININE RATIO, URINE
Creatinine, Urine: 70.68 mg/dL
Protein Creatinine Ratio: 0.1 mg/mg{Cre} (ref 0.00–0.15)
Total Protein, Urine: 7 mg/dL

## 2021-04-26 NOTE — Progress Notes (Addendum)
LOS: Day #6 Morgan Boyd 35 y.o. [id number] 25w 1d Admitted for Severe IUGR on 7/1 at 24.2 weeks, <1%, intermittent reversed end diastolic flow, echogenic bowel, GHTN, h/o fibroid, vit D deficiency.  S: Coping well with hospital stay. Trying to get adjusted to EFM TID instead of continuous. Reassured from Tuesdays U/S. Encouraged to open blinds and get some sunshine. Reports good FM. Denies HA, visual changes and epigastric pain. Discussed POC. U/S and dopplers scheduled for tomorrow.  O: BP 138/84 (BP Location: Left Arm)   Pulse 74   Temp 98 F (36.7 C) (Oral)   Resp 16   Ht 5\' 3"  (1.6 m)   Wt 102.1 kg   LMP 11/01/2020   SpO2 98%   BMI 39.86 kg/m    Physical Exam FHR 145/ variability present/ 10x10 accelerations/ No decelerations Toco: no contractions SVE: deferred  Gen: AAO x3 CV: RRR, no additional sounds Resp: clear, unlabored GI: soft, non tender, gravid GU: neg for vaginal bleeding and neg for LOF Extrem: neg edema, pain, tenderness, or cords  A/P:   IUGR    S/P magnesium IV for neuro protection    Antenatal steroids complete 7/3    NICU consult complete    [place] and Doppler on 7/5-Breech/ anterior placenta/ AFI WNL/    intermittent reversed end-diastolic flow    Doppler studies of the ductus venosus continues to show a          normal A wave, indicating that a fetal demise is not imminent    FHR appropriate for gest age    May modify fetal monitoring from continuous to TID x 30 min    Twice weekly [place] and doppler studies on Tue and Fri    Last EFW 1# 1 oz (475 G) on 04/20/21   Fetus w/ echogenic bowel    -CMV and toxoplasmosis negative    -Cystic fibrosis results pending   GHTN    -pressures increasing, severe range BP responded to IV antihypertensives 04/25/21 repeat CMP WNLand urine PCR 0.10     -baby aspirin 81 mg PO daily Dr Alesia Richards aware of pt status and [place], MSN, [facility] 04/26/21 0957 MD Addendum: I reviewed chart and agree with above  findings, assessment and plan as outlined above by Morgan Humbles, MSN, CNM. I reviewed NST for today at 0930 AM: 150 BL, minimal to moderate variability, reactive by 10 x 10 criteria, no decelerations. TOCO: no contractions.  Dr. Alesia Richards.  04/26/2021.  11:23 AM.

## 2021-04-27 ENCOUNTER — Ambulatory Visit

## 2021-04-27 ENCOUNTER — Inpatient Hospital Stay (HOSPITAL_BASED_OUTPATIENT_CLINIC_OR_DEPARTMENT_OTHER)

## 2021-04-27 DIAGNOSIS — O365920 Maternal care for other known or suspected poor fetal growth, second trimester, not applicable or unspecified: Secondary | ICD-10-CM

## 2021-04-27 DIAGNOSIS — E669 Obesity, unspecified: Secondary | ICD-10-CM

## 2021-04-27 DIAGNOSIS — O283 Abnormal ultrasonic finding on antenatal screening of mother: Secondary | ICD-10-CM

## 2021-04-27 DIAGNOSIS — O43192 Other malformation of placenta, second trimester: Secondary | ICD-10-CM

## 2021-04-27 DIAGNOSIS — O99212 Obesity complicating pregnancy, second trimester: Secondary | ICD-10-CM

## 2021-04-27 DIAGNOSIS — D259 Leiomyoma of uterus, unspecified: Secondary | ICD-10-CM

## 2021-04-27 DIAGNOSIS — Z3A25 25 weeks gestation of pregnancy: Secondary | ICD-10-CM

## 2021-04-27 DIAGNOSIS — O321XX0 Maternal care for breech presentation, not applicable or unspecified: Secondary | ICD-10-CM

## 2021-04-27 DIAGNOSIS — O3412 Maternal care for benign tumor of corpus uteri, second trimester: Secondary | ICD-10-CM

## 2021-04-27 LAB — TYPE AND SCREEN
ABO/RH(D): A POS
Antibody Screen: NEGATIVE

## 2021-04-27 NOTE — Progress Notes (Signed)
Patient is doing well - family members (two children) and husband at bedside. Doing well and has no concerns at this time. Desires to be on continuous fetal monitoring as it makes her feel more reassured. FHT baseline 145bpm, minimal to moderate variability, - accels, - decelerations. No contractions on toco. Discussed with patient that I have reviewed her [place] with Dr. Annamaria Boots (MFM). Dr. Annamaria Boots notes that the dopplers of ductus venosus continues to show a normal A wave which indicates that fetal demise is not imminent. Continue CEFM due to patient request. Will continue to monitor closely for any changes in maternal/fetal status.  Drema Dallas, DO

## 2021-04-27 NOTE — Progress Notes (Signed)
Initial Nutrition Assessment  DOCUMENTATION CODES:  Obesity unspecified  INTERVENTION:  Regular diet Pt may order double protein portions and snacks TID if she makes request when ordering meals   NUTRITION DIAGNOSIS:   Increased nutrient needs related to  (pregnancy and fetal growth requirements) as evidenced by  (25 weeks IUP, fetus IUGR).  GOAL:   Patient will meet greater than or equal to 90% of their needs  MONITOR:  Weight trends  REASON FOR ASSESSMENT:  Antenatal, LOS   ASSESSMENT:   Now 25 2/7 weeks, ongoing adm for IUGR. on prenatal vits and vit D for Hx of vit D deficiency, level now wnl. prenatal weight not found in chart review  Diet Order:   Diet Order             Diet regular Room service appropriate? Yes; Fluid consistency: Thin  Diet effective now                  EDUCATION NEEDS:   No education needs have been identified at this time  Skin:  Skin Assessment: Reviewed RN Assessment   Height:   Ht Readings from Last 1 Encounters:  04/20/21 5\' 3"  (1.6 m)    Weight:   Wt Readings from Last 1 Encounters:  04/20/21 102.1 kg    Ideal Body Weight:   115 lbs  BMI:  Body mass index is 39.86 kg/m.  Estimated Nutritional Needs:   Kcal:  2200-2400  Protein:  100-110 g  Fluid:  2.5 l

## 2021-04-27 NOTE — Progress Notes (Signed)
Dr. Charlesetta Garibaldi at bedside and has reviewed fetal heart rate tracing. No new orders. Toya Smothers, RN

## 2021-04-27 NOTE — Progress Notes (Signed)
Estanislado Pandy, CNM aware of BP 154/91. No new  orders given. Toya Smothers, RN

## 2021-04-27 NOTE — Progress Notes (Signed)
COTY STUDENT 35 y.o. [id number] 25w 1d Admitted for Severe IUGR on 7/1 at 24.2 weeks, <1%, intermittent reversed end diastolic flow, echogenic bowel, GHTN, h/o fibroid, vit D deficiency.   S: Coping well with hospital stay. Trying to get adjusted to EFM TID instead of continuous.   Reports good FM. Denies HA, visual changes and epigastric pain. Discussed POC. U/S and dopplers today   O: BP 138/84 (BP Location: Left Arm)   Pulse 74   Temp 98 F (36.7 C) (Oral)   Resp 16   Ht 5\' 3"  (1.6 m)   Wt 102.1 kg   LMP 11/01/2020   SpO2 98%   BMI 39.86 kg/m     Physical Exam FHR 145/ variability present/ 10x10 accelerations/ No decelerations Toco: no contractions SVE: deferred   Gen: AAO x3 CV: RRR, no additional sounds Resp: clear, unlabored GI: soft, non tender, gravid GU: neg for vaginal bleeding and neg for LOF Extrem: neg edema, pain, tenderness, or cords   A/P:   IUGR    S/P magnesium IV for neuro protection    Antenatal steroids complete 7/3    NICU consult complete US and dopplers pending    Doppler studies of the ductus venosus continues to show a          normal A wave, indicating that a fetal demise is not imminent    FHR appropriate for gest age    May modify fetal monitoring from continuous to TID x 30 min    Twice weekly [place] and doppler studies on Tue and Fri    Last EFW 1# 1 oz (475 G) on 04/20/21 NST decreased varibility and now improving will do continuous monitoring.     Fetus w/ echogenic bowel    -CMV and toxoplasmosis negative    -Cystic fibrosis results pending   GHTN    -pressures increasing, severe range BP responded to IV antihypertensives 04/25/21 repeat CMP WNLand urine 0.1   will  start and antihypertensive if needed.        -baby aspirin 81 mg PO daily

## 2021-04-27 NOTE — Progress Notes (Signed)
RN called to discuss CEFM. Baby active. Hard to trace. Patient asking to return to [facility] TID per RN. U/S today showed normal A wave which is indicative that a fetal demise is not imminent. Ok to return to TID [facility] per pt request. Holli Humbles CNM

## 2021-04-27 NOTE — Progress Notes (Addendum)
LOS: Day #7 Morgan Boyd 35 y.o. [id number] 25w 2d Admitted for Severe IUGR on 7/1 at 24.2 weeks, <1%, intermittent reversed end diastolic flow, echogenic bowel, GHTN, h/o fibroid, vit D deficiency.   S: Pt very tearful. Just returned from ultrasound. Results pending. Pt states the ultrasound was worse today. More reverse flow was seen. Support and reassurance given. Reports good FM. Denies HA, visual changes and epigastric pain. Discussed POC. Dr Charlesetta Garibaldi to come see pt today.   O: BP (!) 156/93 (BP Location: Left Arm)   Pulse 78   Temp 98.7 F (37.1 C) (Oral)   Resp 18   Ht 5\' 3"  (1.6 m)   Wt 102.1 kg   LMP 11/01/2020   SpO2 97%   BMI 39.86 kg/m     Physical Exam FHR 145/ variability minimal/ no accelerations/ no decelerations Toco: no contractions SVE: deferred   Gen: AAO x3 CV: RRR, no additional sounds Resp: clear, unlabored GI: soft, non tender, gravid GU: neg for vaginal bleeding and neg for LOF Extrem: neg edema, pain, tenderness, or cords   A/P:   IUGR    S/P magnesium IV for neuro protection on admission    Antenatal steroids complete 7/3    NICU consult complete    [place] and Doppler on 7/5-Breech/ anterior placenta/ AFI WNL/    intermittent reversed end-diastolic flow. Awaiting today's results.    Doppler studies of the ductus venosus continues to show a          normal A wave, indicating that a fetal demise is not imminent    FHR appropriate for gest age. Awaiting today's results    NST TID    Twice weekly [place] and doppler studies on Tue and Fri    Last EFW 1# 1 oz (475 G) on 04/20/21   Fetus w/ echogenic bowel    -CMV and toxoplasmosis negative    -Cystic fibrosis results pending ordered 04/24/21   GHTN    -pressures increasing, severe range BP responded to IV antihypertensives 04/25/21 repeat CMP WNLand urine PCR 0.10 on 04/26/21      -baby aspirin 81 mg PO daily Dr Charlesetta Garibaldi aware of pt status and [place], MSN, CNM

## 2021-04-28 LAB — COMPREHENSIVE METABOLIC PANEL
ALT: 19 U/L (ref 0–44)
AST: 23 U/L (ref 15–41)
Albumin: 2.6 g/dL — ABNORMAL LOW (ref 3.5–5.0)
Alkaline Phosphatase: 292 U/L — ABNORMAL HIGH (ref 38–126)
Anion gap: 7 (ref 5–15)
BUN: 8 mg/dL (ref 6–20)
CO2: 23 mmol/L (ref 22–32)
Calcium: 9.2 mg/dL (ref 8.9–10.3)
Chloride: 104 mmol/L (ref 98–111)
Creatinine, Ser: 0.6 mg/dL (ref 0.44–1.00)
GFR, Estimated: >60 mL/min (ref >60)
Glucose, Bld: 81 mg/dL (ref 70–99)
Potassium: 4 mmol/L (ref 3.5–5.1)
Sodium: 134 mmol/L — ABNORMAL LOW (ref 135–145)
Total Bilirubin: 0.4 mg/dL (ref 0.3–1.2)
Total Protein: 6.3 g/dL — ABNORMAL LOW (ref 6.5–8.1)

## 2021-04-28 LAB — CBC
HCT: 40.4 % (ref 36.0–46.0)
Hemoglobin: 12.8 g/dL (ref 12.0–15.0)
MCH: 28.4 pg (ref 26.0–34.0)
MCHC: 31.7 g/dL (ref 30.0–36.0)
MCV: 89.6 fL (ref 80.0–100.0)
Platelets: 228 10*3/uL (ref 150–400)
RBC: 4.51 MIL/uL (ref 3.87–5.11)
RDW: 14.8 % (ref 11.5–15.5)
WBC: 11.4 10*3/uL — ABNORMAL HIGH (ref 4.0–10.5)
nRBC: 0 % (ref 0.0–0.2)

## 2021-04-28 LAB — PROTEIN / CREATININE RATIO, URINE
Creatinine, Urine: 137.03 mg/dL
Protein Creatinine Ratio: 0.11 mg/mg{Cre} (ref 0.00–0.15)
Total Protein, Urine: 15 mg/dL

## 2021-04-28 MED ORDER — ACETAMINOPHEN 500 MG PO TABS
1000.0000 mg | ORAL_TABLET | Freq: Three times a day (TID) | ORAL | Status: DC | PRN
  Administered 2021-04-28 – 2021-05-16 (×5): 1000 mg via ORAL
  Filled 2021-04-28 (×7): qty 2

## 2021-04-28 MED ORDER — OXYCODONE-ACETAMINOPHEN 5-325 MG PO TABS: 1.0000 | ORAL_TABLET | ORAL | Status: DC

## 2021-04-28 MED ORDER — OXYCODONE-ACETAMINOPHEN 5-325 MG PO TABS
2.0000 | ORAL_TABLET | ORAL | Status: AC
  Filled 2021-04-28: qty 2

## 2021-04-28 NOTE — Progress Notes (Signed)
Hospital day # 8 [redacted]w[redacted]d with Severe IOL on 7/1 at 24.2 weeks, <1% with GHTN, h/o fibroid.  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  Pt endorses having mild HA now, has had HA since yesterday with slight relief with ice pack and tylenol. pt denies RUQ pain or vision changes, denies depression, mood stable and hopeful, pt endorses very active fetal movement. Pt plans on sitting in outside porch for peace. Pt hopeful and happy for another day that [facility] stays in.         Perception of contractions: none      Vaginal bleeding: none now       Vaginal discharge:  no significant change  O: BP (!) 148/94 (BP Location: Left Arm)   Pulse 88   Temp 98.4 F (36.9 C) (Oral)   Resp 17   Ht 5\' 3"  (1.6 m)   Wt 102.1 kg   LMP 11/01/2020   SpO2 100%   BMI 39.86 kg/m       Fetal tracings: Baseline 150s moderate variability/minimal variability at times, +acel 10x10, +decel random variables noted with spontaneous return to baseline, reactive for gestational age.       Contractions:   None      Uterus gravid and non-tender      Extremities: extremities normal, atraumatic, no cyanosis or edema, Homans sign is negative, no sign of DVT, no edema, redness or tenderness in the calves or thighs, and no significant  edema and no signs of DVT       A/P: [redacted]w[redacted]d with Severe IOL on 7/1 at 24.2 weeks, <1% with GHTN, h/o fibroid.  LOS# 8  Severe IUGR: Stable, s/p 24 hours magnesium, on 7/1, NST reactive for gestational age, but Cat 2 noted with variables and minimal variability at times, Repeat US on 7/8 continues to show the same status Doppler studies of the ductus venosus continues to show a normal A wave, indicating that a fetal demise is not imminent, breech, anterior placenta, AFI WNL, doppler on 7/1 showed absent end diastolic flow, continues to remain the same, but MFM document intermittent reversed flow with Dopplers on 7/1, EFW 1.1lbs <1% with echogenic bowels, cmv and toxo neg, panoroma LR female, declined amniocentesis CF testing pending. Plan to repeat dopplers bi-weekly on tuesday and fridays. Continue TID NST. BMZ completed 7/1-7/2. Plan for PCS if delivery needed, also plan form  magnesium for neuroprotectant. NICU consult complete.   GHTN: Pt stable, asymptomatic, BP 148/94, no meds currently, required on 7/6 x1 dose IV labetalol, PCR 0.17 upon admission then 0.10 and PCR pending today.  Unremarkable other labs. Plan to repeat lab on tuesdays and fridays.   Vit D Deficiency: Stable, on 1000IUn vit D,  increased to 4000IU recommended during pregnancy. Vit D level on  7/4 was 34.19.     [place], MSN 04/28/2021 11:25 AM

## 2021-04-28 NOTE — Progress Notes (Signed)
Full morning progress note to follow. Patient doing well this morning. Resting in bed and in good spirits. Reports active fetal movement. Patient remained on CEFM overnight although discontinuous overnight due to difficulty graphing. FHT reviewed overnight. Baseline 145-150 with minimal variability, occasional variable decelerations. No contractions on toco. Patient feels comfortable with NSTs q8h for now or upon request. Reassurance provided. Will continue to monitor.  Drema Dallas, DO

## 2021-04-28 NOTE — Progress Notes (Signed)
Pt BPs have elevated overnight to 150s over 80s. Provider has been notified in which labs have been ordered and are in progress. Headache is present at this time, currently pain 5/10. Pt refused pain medication. No other PIH s/s verbalized at this time. Will continue to monitor pt.

## 2021-04-29 MED ORDER — LABETALOL HCL 100 MG PO TABS
100.0000 mg | ORAL_TABLET | Freq: Two times a day (BID) | ORAL | Status: DC
  Administered 2021-04-29 – 2021-05-16 (×35): 100 mg via ORAL
  Filled 2021-04-29 (×35): qty 1

## 2021-04-29 NOTE — Progress Notes (Signed)
See today's full progress note by [facility], CNM. Patient feeling well this morning. No concerns. Feeling good FM.  After our encounter, FHT reviewed - baseline 145-150bpm, moderate to minimal variability, occasional small variable decelerations. No contractions on toco.  Continue current plan of care. Labetalol 100mg  PO BID started today for high mild range blood pressures.  Drema Dallas, DO

## 2021-04-29 NOTE — Progress Notes (Signed)
Fetal heart tracings throughout the weekend reviewed with Dr. Donalee Citrin (MFM). Variables and spontaneous decelerations are not recurrent. Recommend continuing to monitor as previously planned.  Drema Dallas, DO

## 2021-04-29 NOTE — Progress Notes (Signed)
Hospital day # 9 [redacted]w[redacted]d with Severe IOL on 7/1 at 24.2 weeks, <1% with GHTN, h/o fibroid.  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 Pt agreeable to starting low dose labetalol 100mg  PO oBID for persistently elevated BP in the high 150s/90s. No change in diagnosis made due to normal labs and no neruo s/sx present currently. RN called last night during NST, x1 prolonged decel noted to nadir of 120s, with spontaneous return. Pt endorses +FM.         Perception of contractions: none      Vaginal bleeding: none now       Vaginal discharge:  no significant change  O: BP (!) 158/95 (BP Location: Left Arm)   Pulse 84   Temp 98.5 F (36.9 C) (Oral)   Resp 18   Ht 5\' 3"  (1.6 m)   Wt 102.1 kg   LMP 11/01/2020   SpO2 98%   BMI 39.86 kg/m       Fetal tracings: Baseline 150s moderate variability/minimal variability at times, +acel 10x10, +decel random variables noted with spontaneous return to baseline, reactive for gestational age.       Contractions:   None      Uterus gravid and non-tender      Extremities: extremities normal, atraumatic, no cyanosis or edema, Homans sign is negative, no sign of DVT, no edema, redness or tenderness in the calves  or thighs, and no significant edema and no signs of DVT       A/P: [redacted]w[redacted]d with Severe IOL on 7/1 at 24.2 weeks, <1% with GHTN, h/o fibroid.  LOS# 9  Severe IUGR: Stable, s/p 24 hours magnesium, on 7/1, NST reactive for gestational age, but noted x1 prolonged decel to nadir of 120s with return to baseline and minimal variability at times, Repeat US on 7/8 continues to show the same status Doppler studies of the ductus venosus continues to show a normal A wave, indicating that a fetal demise is not imminent, breech, anterior placenta, AFI WNL, doppler on 7/1 showed absent end diastolic flow, continues to remain the same, but MFM document intermittent reversed flow with Dopplers on 7/1, EFW 1.1lbs <1% with echogenic bowels, cmv and toxo neg, panoroma LR female, declined amniocentesis CF testing pending. Plan to repeat dopplers bi-weekly on tuesday and fridays. Continue TID NST. BMZ completed 7/1-7/2. Plan for PCS if delivery needed, also plan form  magnesium for neuroprotectant. NICU consult complete.   GHTN: Pt stable, asymptomatic, BP 158/95, no meds currently, required on 7/6 x1 dose IV labetalol, PCR 0.17 upon admission then 0.10 and PCR 0.11 on 7/9.  Unremarkable other labs, last drawn on 7/8. Had mild HA  last two days but gone now. BP remains persistently elevated consulted with DR davies plan to started labetalol 100mg  PO BID.  Plan to repeat lab on tuesdays and fridays.   Vit D Deficiency: Stable, asymptomatic, on 1000IUn vit D, increased to 4000IU recommended during pregnancy. Vit D level on  7/4 was 34.19.     [place], MSN 04/29/2021 9:21 AM

## 2021-04-30 NOTE — Progress Notes (Signed)
Hospital Day 10, 25.5 wk with Severe IUGR on 7/1 @ 24.2wks, =1% with GHTN, h/o fibroid, vitamin D deficiency.  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 On labetalol 100 mg BID. No questions @ present. No change in diagnosis made due to normal labs and no neruo s/sx present currently. Currently on NST. FHT 145 with minimal to moderate variability, occasional variable. Overall reassuring for 25.5 weeks.        Perception of contractions: none      Vaginal bleeding: none now                Vaginal discharge:  no significant change   BP (!) 146/80 (BP Location: Left Arm)   Pulse 77   Temp 98.4 F (36.9 C) (Oral)   Resp 18   Ht 5\' 3"  (1.6 m)   Wt 102.1 kg   LMP 11/01/2020   SpO2 99%   BMI 39.86 kg/m         Fetal tracings: Baseline 145 moderate variability/minimal variability at times, no present accelerations at this time,  +decel random variables noted with spontaneous return to baseline, reactive for gestational age.      Contractions:   None      Uterus gravid and non-tender      Extremities: extremities normal, atraumatic, no cyanosis or edema, Homans sign is negative, no sign of DVT, no edema, redness or tenderness in the calves or  thighs, and no significant edema and no signs of DVT         A/P: [redacted]w[redacted]d with Severe IOL on 7/1 at 24.2 weeks, <1% with GHTN, h/o fibroid.   LOS# 10   Severe IUGR: Stable, s/p 24 hours magnesium, on 7/1, NST reactive for gestational age, but noted x1 prolonged decel to nadir of 120s with return to baseline and minimal variability at times, Repeat US on 7/8 continues to show the same status Doppler studies of the ductus venosus continues to show a normal A wave, indicating that a fetal demise is not imminent, breech, anterior placenta, AFI WNL, doppler on 7/1 showed absent end diastolic flow, continues to remain the same, but MFM document intermittent reversed flow with Dopplers on 7/1, EFW 1.1lbs <1% with echogenic bowels, cmv and toxo neg, panoroma LR female, declined amniocentesis CF testing pending. Plan to repeat dopplers bi-weekly on tuesday and fridays. Continue TID NST. BMZ completed 7/1-7/2. Plan for PCS if delivery needed, also plan form  magnesium for neuroprotectant. NICU consult complete.   GHTN: Pt stable, asymptomatic, BP 146/80, Labetalol 100mg  po  BID started on 04/29/21, required on 7/6 x1 dose IV labetalol, PCR 0.17 upon admission then 0.10 and PCR 0.11 on 7/9.  Unremarkable other labs, last drawn on 7/8. Denies HA, visual disturbances and epigastric pain. Plan to repeat lab on tuesdays and fridays.   Vit D Deficiency: Stable, asymptomatic, on 1000IUn vit D, increased to 4000IU recommended during pregnancy. Vit D level on  7/4 was 34.19.   Dr Alwyn Pea aware of Pt status and [place], MSN, CNM 04/30/21 11:15AM

## 2021-05-01 ENCOUNTER — Ambulatory Visit

## 2021-05-01 ENCOUNTER — Inpatient Hospital Stay (HOSPITAL_BASED_OUTPATIENT_CLINIC_OR_DEPARTMENT_OTHER)

## 2021-05-01 DIAGNOSIS — O365920 Maternal care for other known or suspected poor fetal growth, second trimester, not applicable or unspecified: Secondary | ICD-10-CM | POA: Diagnosis not present

## 2021-05-01 DIAGNOSIS — O09522 Supervision of elderly multigravida, second trimester: Secondary | ICD-10-CM | POA: Diagnosis not present

## 2021-05-01 DIAGNOSIS — Z3A25 25 weeks gestation of pregnancy: Secondary | ICD-10-CM

## 2021-05-01 DIAGNOSIS — O43192 Other malformation of placenta, second trimester: Secondary | ICD-10-CM

## 2021-05-01 DIAGNOSIS — O3412 Maternal care for benign tumor of corpus uteri, second trimester: Secondary | ICD-10-CM

## 2021-05-01 DIAGNOSIS — O288 Other abnormal findings on antenatal screening of mother: Secondary | ICD-10-CM

## 2021-05-01 DIAGNOSIS — D259 Leiomyoma of uterus, unspecified: Secondary | ICD-10-CM

## 2021-05-01 LAB — CBC WITH DIFFERENTIAL/PLATELET
Abs Immature Granulocytes: 0.03 10*3/uL (ref 0.00–0.07)
Basophils Absolute: 0.1 10*3/uL (ref 0.0–0.1)
Basophils Relative: 1 %
Eosinophils Absolute: 0.2 10*3/uL (ref 0.0–0.5)
Eosinophils Relative: 2 %
HCT: 36.6 % (ref 36.0–46.0)
Hemoglobin: 12.3 g/dL (ref 12.0–15.0)
Immature Granulocytes: 0 %
Lymphocytes Relative: 33 %
Lymphs Abs: 3.3 10*3/uL (ref 0.7–4.0)
MCH: 29.4 pg (ref 26.0–34.0)
MCHC: 33.6 g/dL (ref 30.0–36.0)
MCV: 87.6 fL (ref 80.0–100.0)
Monocytes Absolute: 0.9 10*3/uL (ref 0.1–1.0)
Monocytes Relative: 9 %
Neutro Abs: 5.5 10*3/uL (ref 1.7–7.7)
Neutrophils Relative %: 55 %
Platelets: 201 10*3/uL (ref 150–400)
RBC: 4.18 MIL/uL (ref 3.87–5.11)
RDW: 14.7 % (ref 11.5–15.5)
WBC: 10 10*3/uL (ref 4.0–10.5)
nRBC: 0 % (ref 0.0–0.2)

## 2021-05-01 LAB — COMPREHENSIVE METABOLIC PANEL
ALT: 22 U/L (ref 0–44)
AST: 22 U/L (ref 15–41)
Albumin: 2.6 g/dL — ABNORMAL LOW (ref 3.5–5.0)
Alkaline Phosphatase: 284 U/L — ABNORMAL HIGH (ref 38–126)
Anion gap: 7 (ref 5–15)
BUN: 5 mg/dL — ABNORMAL LOW (ref 6–20)
CO2: 21 mmol/L — ABNORMAL LOW (ref 22–32)
Calcium: 9 mg/dL (ref 8.9–10.3)
Chloride: 107 mmol/L (ref 98–111)
Creatinine, Ser: 0.64 mg/dL (ref 0.44–1.00)
GFR, Estimated: >60 mL/min (ref >60)
Glucose, Bld: 98 mg/dL (ref 70–99)
Potassium: 3.7 mmol/L (ref 3.5–5.1)
Sodium: 135 mmol/L (ref 135–145)
Total Bilirubin: 0.3 mg/dL (ref 0.3–1.2)
Total Protein: 6.1 g/dL — ABNORMAL LOW (ref 6.5–8.1)

## 2021-05-01 LAB — MISC LABCORP TEST (SEND OUT): Labcorp test code: 480533

## 2021-05-01 LAB — PROTEIN / CREATININE RATIO, URINE
Creatinine, Urine: 34.37 mg/dL
Total Protein, Urine: <6 mg/dL

## 2021-05-01 NOTE — Progress Notes (Signed)
LOS: Day #11 Morgan Boyd 35 y.o. [id number] [redacted]w[redacted]d  Admitted for Severe IUGR on 7/1 at 24.2 weeks 1%ile, intermittent reversed end diastolic flow, echogenic bowel, GHTN, h/o fibroid, vit D deficiency.   S: Doing well. Has started ambulating for 30 min daily around the unit. Well-adjusted to hospital stay, utilizing chaplain services, and staying in good spirits. Denies HA, visual changes, or RUQ pain. Answered questions RE: Q 12 hr dosing of Labetalol. Reports active FM. Reviewed today's [place] results and change in [place] scheduling from twice weekly to three times per week (M-W-F)  O: Blood pressure (!) 147/83, pulse 86, temperature 98.4 F (36.9 C), temperature source Oral, resp. rate 18, height 5\' 3"  (1.6 m), weight 102.1 kg, last menstrual period 11/01/2020, SpO2 100 %.   Phys exam: FHR baseline 145/ minimal tomoderate variability/ 10x10 accels/ no decels SVE deferred   Gen: AAO x3 CV: RRR, no additional sounds Resp: clear, unlabored GI: soft, non tender, gravid GU: neg for vaginal bleeding and neg for LOF Extrem: neg edema, pain, tenderness, or cords   A/P:   IUGR    S/P magnesium IV for neuro protection    Antenatal steroids complete 7/3    NICU consult complete    [place] and Doppler on 7/12-Breech/ fundal placenta/ AFI WNL but subjectively decreased w/ largest pocket 4.3/ reversed end-diastolic flow    Doppler studies on 7/12 of the ductus venosus did not show reversed flow    FHR appropriate for gest age    Continue NST TID    New MFM recommendation: UA Dopplers three times a week (Thu and Sat this week and  then Mon-Wed-Fri).    Last EFW 1# 1 oz (475 G) on 04/20/21   Fetus w/ echogenic bowel    -CMV and toxoplasmosis negative    -Cystic fibrosis results pending   GHTN    -Labetalol 100 mg BID    -normal to mild range BP, no severe range and no IV antihypertensives required    -continue baby aspirin 81 mg PO daily   Plan reviewed w/ Dr/ Mancel Bale.

## 2021-05-02 MED ORDER — SODIUM CHLORIDE 0.9% FLUSH
3.0000 mL | Freq: Two times a day (BID) | INTRAVENOUS | Status: DC
  Administered 2021-05-02 – 2021-05-17 (×26): 3 mL via INTRAVENOUS

## 2021-05-02 NOTE — Progress Notes (Signed)
LOS: Day #12 DEMETRI KERMAN 35 y.o. [id number] 26wks   Admitted for Severe IUGR on 7/1 at 24.2 weeks 1%ile, intermittent reversed end diastolic flow, echogenic bowel, GHTN, h/o fibroid, vit D deficiency.   S: Doing well. Ambulating for 30 min daily around the unit. Pt very happy for another day of pregnancy. Continues to use chaplin services as needed. =  Denies HA, visual changes, or RUQ pain. Reports active FM. Discussed yesterday's [place] results and change in [place] scheduling from twice weekly to three times per week (M-W-F) This week U/S will be Thursday and Saturday. O: BP (!) 156/96 (BP Location: Left Arm)   Pulse 75   Temp 98 F (36.7 C) (Oral)   Resp 18   Ht 5\' 3"  (1.6 m)   Wt 102.1 kg   LMP 11/01/2020   SpO2 98%   BMI 39.86 kg/m     Phys exam: FHR baseline 145/ minimal to moderate variability/ 10x10 accels/ occasional small variable decels SVE deferred   Gen: AAO x3 CV: RRR, no additional sounds Resp: clear, unlabored GI: soft, non tender, gravid GU: neg for vaginal bleeding and neg for LOF Extrem: neg edema, pain, tenderness, or cords   A/P:   IUGR    S/P magnesium IV for neuro protection    Antenatal steroids complete 7/3    NICU consult complete    [place] and Doppler on 7/12-Breech/ fundal placenta/ AFI WNL but subjectively decreased w/ largest pocket 4.3/ reversed end-diastolic flow    Doppler studies on 7/12 of the ductus venosus did not show reversed flow    FHR appropriate for gest age    Continue NST TID    New MFM recommendation: UA Dopplers three times a week (Thu and Sat this week and  then Mon-Wed-Fri).    Last EFW 1# 1 oz (475 G) on 04/20/21   Fetus w/ echogenic bowel    -CMV and toxoplasmosis negative    -Cystic fibrosis results pending   GHTN    -Labetalol 100 mg BID    -normal to mild range BP, no severe range and no IV antihypertensives required    -continue baby aspirin 81 mg PO daily Dr Charlesetta Garibaldi aware of pt status and [place],  CNM 05/02/21 1030AM

## 2021-05-03 ENCOUNTER — Inpatient Hospital Stay (HOSPITAL_BASED_OUTPATIENT_CLINIC_OR_DEPARTMENT_OTHER)

## 2021-05-03 DIAGNOSIS — D259 Leiomyoma of uterus, unspecified: Secondary | ICD-10-CM

## 2021-05-03 DIAGNOSIS — O365920 Maternal care for other known or suspected poor fetal growth, second trimester, not applicable or unspecified: Secondary | ICD-10-CM | POA: Diagnosis not present

## 2021-05-03 DIAGNOSIS — O43192 Other malformation of placenta, second trimester: Secondary | ICD-10-CM

## 2021-05-03 DIAGNOSIS — O09522 Supervision of elderly multigravida, second trimester: Secondary | ICD-10-CM

## 2021-05-03 DIAGNOSIS — O99212 Obesity complicating pregnancy, second trimester: Secondary | ICD-10-CM

## 2021-05-03 DIAGNOSIS — O283 Abnormal ultrasonic finding on antenatal screening of mother: Secondary | ICD-10-CM | POA: Diagnosis not present

## 2021-05-03 DIAGNOSIS — Z3A26 26 weeks gestation of pregnancy: Secondary | ICD-10-CM

## 2021-05-03 DIAGNOSIS — E669 Obesity, unspecified: Secondary | ICD-10-CM

## 2021-05-03 DIAGNOSIS — O321XX0 Maternal care for breech presentation, not applicable or unspecified: Secondary | ICD-10-CM

## 2021-05-03 DIAGNOSIS — O3412 Maternal care for benign tumor of corpus uteri, second trimester: Secondary | ICD-10-CM

## 2021-05-03 NOTE — Progress Notes (Signed)
Chaplain stopped by for follow up and pt was on phone. Chaplain offered to return later at pt request.  Chaplain followed up 90 minutes later and pt is sleeping. Chaplain left note on bedside. Please feel free to page on call chaplain if pt requests visit.  Please page as further needs arise.  Donald Prose. Elyn Peers, M.Div. [facility] Chaplain Pager [phone] Office [phone]

## 2021-05-03 NOTE — Progress Notes (Addendum)
Hospital day # 13 Admitted 7/1 @ [redacted]w[redacted]d with Severe IUGR <1% , with echogenic bowel, GHTN, H/O fibroid. Today 26.1 weeks.   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 No change in diagnosis made due to normal labs and no neruo s/sx present currently. Pt endorses +FM.         Perception of contractions: none      Vaginal bleeding: none now       Vaginal discharge:  no significant change  O: BP (!) 142/90 (BP Location: Left Arm)   Pulse 79   Temp 98.5 F (36.9 C) (Oral)   Resp 16   Ht 5\' 3"  (1.6 m)   Wt 102.1 kg   LMP 11/01/2020   SpO2 97%   BMI 39.86 kg/m       Fetal tracings: Baseline 145s moderate variability, +acel 10x10, +decel for 2 mins to nadir of 110s with spontaneous return reactive for gestational age.       Contractions:   None      Uterus gravid and non-tender      Extremities: extremities normal, atraumatic, no cyanosis or edema, Homans sign is negative, no sign of DVT, no edema, redness or tenderness in the calves or thighs, and no significant edema and no signs of DVT       A/P: Hospital day # 13 Admitted 7/1 @ [redacted]w[redacted]d with Severe IUGR <1% , with echogenic bowel, GHTN, H/O fibroid. Today 26.1 weeks.   LOS# 13  Severe IUGR: Stable, s/p 24 hours magnesium on 7/1, NST reactive for gestational age,  but noted x1 decel for 2 mins to nadir of 110s with return to baseline, Repeat US/Doppler today 7/14: (Pending). 7/12 Doppler study show intermittent reverse end diastolic flow with ductus venosus continues to show a normal A wave, indicating that a fetal demise is not imminent, breech, anterior placenta, AFI subjectively low 7/12 with MVP 4.3, 7/1, EFW 1.1lbs <1% with echogenic bowels, cmv and toxo neg, panoroma LR female, declined amniocentesis CF testing pending. Plan to repeat dopplers tri-weekly on this thurs, Saturday, then monday, wednesdays and fridays. Continue TID NST. BMZ completed 7/1-7/2. Plan for PCS if delivery needed, also plan form  magnesium for neuroprotectant. NICU consult complete.   GHTN: Pt stable, asymptomatic, BP 142/90, started on labetalol 100mg  BID PO on 7/10, required on 7/6 x1 dose IV labetalol, PCR 0.17 upon admission then 0.10 and PCR 0.11 on 7/9, and undetected on 7/12.  Unremarkable other labs, last drawn on 7/12. Had mild HA last 7/10 that resolved.  Plan to repeat lab on tuesdays and fridays.   Vit D Deficiency: Stable, asymptomatic, on 1000IUn vit D, increased to 4000IU recommended during pregnancy. Vit D level on  7/4 was 34.19.  Echogenic Bowel: -CMV, -Tox, - CF carrier screen. Peds to Reassess PP.     [place], MSN 05/03/2021 2:58 PM

## 2021-05-04 ENCOUNTER — Ambulatory Visit

## 2021-05-04 LAB — COMPREHENSIVE METABOLIC PANEL
ALT: 34 U/L (ref 0–44)
AST: 27 U/L (ref 15–41)
Albumin: 2.6 g/dL — ABNORMAL LOW (ref 3.5–5.0)
Alkaline Phosphatase: 261 U/L — ABNORMAL HIGH (ref 38–126)
Anion gap: 6 (ref 5–15)
BUN: 5 mg/dL — ABNORMAL LOW (ref 6–20)
CO2: 21 mmol/L — ABNORMAL LOW (ref 22–32)
Calcium: 8.9 mg/dL (ref 8.9–10.3)
Chloride: 107 mmol/L (ref 98–111)
Creatinine, Ser: 0.62 mg/dL (ref 0.44–1.00)
GFR, Estimated: >60 mL/min (ref >60)
Glucose, Bld: 89 mg/dL (ref 70–99)
Potassium: 3.9 mmol/L (ref 3.5–5.1)
Sodium: 134 mmol/L — ABNORMAL LOW (ref 135–145)
Total Bilirubin: 0.6 mg/dL (ref 0.3–1.2)
Total Protein: 6 g/dL — ABNORMAL LOW (ref 6.5–8.1)

## 2021-05-04 LAB — CBC WITH DIFFERENTIAL/PLATELET
Abs Immature Granulocytes: 0.03 10*3/uL (ref 0.00–0.07)
Basophils Absolute: 0 10*3/uL (ref 0.0–0.1)
Basophils Relative: 1 %
Eosinophils Absolute: 0.2 10*3/uL (ref 0.0–0.5)
Eosinophils Relative: 2 %
HCT: 36.8 % (ref 36.0–46.0)
Hemoglobin: 12.1 g/dL (ref 12.0–15.0)
Immature Granulocytes: 0 %
Lymphocytes Relative: 34 %
Lymphs Abs: 2.9 10*3/uL (ref 0.7–4.0)
MCH: 28.9 pg (ref 26.0–34.0)
MCHC: 32.9 g/dL (ref 30.0–36.0)
MCV: 88 fL (ref 80.0–100.0)
Monocytes Absolute: 0.7 10*3/uL (ref 0.1–1.0)
Monocytes Relative: 8 %
Neutro Abs: 4.7 10*3/uL (ref 1.7–7.7)
Neutrophils Relative %: 55 %
Platelets: 214 10*3/uL (ref 150–400)
RBC: 4.18 MIL/uL (ref 3.87–5.11)
RDW: 14.7 % (ref 11.5–15.5)
WBC: 8.6 10*3/uL (ref 4.0–10.5)
nRBC: 0 % (ref 0.0–0.2)

## 2021-05-04 LAB — PROTEIN / CREATININE RATIO, URINE
Creatinine, Urine: 84.07 mg/dL
Total Protein, Urine: <6 mg/dL

## 2021-05-04 MED ORDER — LACTATED RINGERS IV BOLUS
500.0000 mL | Freq: Once | INTRAVENOUS | Status: AC
  Administered 2021-05-04: 500 mL via INTRAVENOUS

## 2021-05-04 NOTE — Progress Notes (Signed)
This chaplain attempted to respond to [facility] referral for ongoing Pt. spiritual care.    The chaplain understands the Pt. is in the shower at the time of the visit. This chaplain will F/U at the best time for the Pt. and chaplain.

## 2021-05-04 NOTE — Progress Notes (Signed)
NST reviewed on antepartum unit.  140s-150s, +acceleration, variable decelerations present, Moderate variability  Reassuring. Continue NSTq shift or if pt reports decreased fetal movement.

## 2021-05-04 NOTE — Progress Notes (Addendum)
Pt in shower. Nst reviewed from 2300 last night.   Moderate variability, mild variable decelerations x 2. NST this am pending.   Addendum:  Pt seen at ~1500 CNM note reviewed and agree. Appreciate MFM recommendations.   D/w pt importance of changes in fetal movement status.  Pt denies LOF, Contractions, vaginal bleeding.  Discussed pt's mental health.  She became tearful but felt she has been coping well given the circumstances.  [place] a 30 min walk daily.  Her husband will be able to spend the night now that her 2 kids will be at her parents.  She appreciates the visits from the [place].  Discussed classical c-section for nonreassuring fetal tracing.  Pt was previously counseled.  Continue with current plan.  Low threshold for continuous fetal monitoring.

## 2021-05-04 NOTE — Progress Notes (Signed)
LOS: Day #14 Morgan Boyd 35 y.o. [id number] [redacted]w[redacted]d  Admitted 7/1 @ [redacted]w[redacted]d with Severe IUGR 1%, with echogenic bowel, GHTN, H/O fibroid.  S: Doing well today, remains in good spirits. Denies HA and other neuro S/S. Reports a restful night. Questions answered RE: yesterday's doppler study and current plan of care.   O: Blood pressure 140/83, pulse 79, temperature 98.4 F (36.9 C), temperature source Oral, resp. rate 16, height 5\' 3"  (1.6 m), weight 102.1 kg, last menstrual period 11/01/2020, SpO2 99 %.  Phys exam: FHR baseline 140/ minimal to moderate variability/ no accels/ variable decels lasting 10-20 sec w/ spont return to baseline  SVE deferred   Gen: AAO x3 CV: RRR, no additional sounds Resp: unlabored GI: soft, non tender, gravid GU: neg for vaginal bleeding and neg for LOF Extrem: neg edema, pain, tenderness, or cords  A/P: IUGR    -S/P magnesium IV for neuro protection    -Antenatal steroids complete 7/3    -NICU consult complete    -[place] and Doppler on 7/14-Breech/ fundal placenta/ AFI decreasing w/ largest pocket now 3.64 reduced from 4.3 on 05/01/21/ persistent reversed end-diastolic flow     -Last EFW 1# 1 oz (475 G) on 04/20/21   Fetus w/ echogenic bowel    -CMV and toxoplasmosis negative    -Cystic fibrosis screening-negative   GHTN    -Labetalol 100 mg BID    -normal to mild range BP, no severe range and no IV antihypertensives required    -continue baby aspirin 81 mg PO daily  MFM impression and recommendations based on 05/03/21 imaging: Ms. Sieler is here for antenatal testing with known FGR  echogenic bowel, and GHTN with abnormal UA Dopplers  indices as well as he ductus venosus.  Today's exam is consistent with persentent reversed EDF,  however the ductuc venosus "a" wave appears slight  depressed by improved from prior exams.  NST have been reactive however, occasional decelerations  are present with return to baseline.  Her blood pressure is  stable  Recommendations  Continue 2x weekly UA Dopplers  Continue NST TID  Repeat BMZ rescue course if fetal testing or maternal status  deteriorates or at 29 weeks  Repeat MgSO4 for neuroprotection 12 hours prior to delivery  Delivery recommended by 30 weeks if remains stable.  Plan discussed w/ Dr. Avis Epley, MSN, CNM 05/04/2021 11:38 AM

## 2021-05-05 ENCOUNTER — Inpatient Hospital Stay (HOSPITAL_BASED_OUTPATIENT_CLINIC_OR_DEPARTMENT_OTHER)

## 2021-05-05 DIAGNOSIS — O09522 Supervision of elderly multigravida, second trimester: Secondary | ICD-10-CM

## 2021-05-05 DIAGNOSIS — O99212 Obesity complicating pregnancy, second trimester: Secondary | ICD-10-CM

## 2021-05-05 DIAGNOSIS — Z3A26 26 weeks gestation of pregnancy: Secondary | ICD-10-CM

## 2021-05-05 DIAGNOSIS — O43192 Other malformation of placenta, second trimester: Secondary | ICD-10-CM

## 2021-05-05 DIAGNOSIS — O3412 Maternal care for benign tumor of corpus uteri, second trimester: Secondary | ICD-10-CM

## 2021-05-05 DIAGNOSIS — O283 Abnormal ultrasonic finding on antenatal screening of mother: Secondary | ICD-10-CM | POA: Diagnosis not present

## 2021-05-05 DIAGNOSIS — O321XX0 Maternal care for breech presentation, not applicable or unspecified: Secondary | ICD-10-CM

## 2021-05-05 DIAGNOSIS — D259 Leiomyoma of uterus, unspecified: Secondary | ICD-10-CM

## 2021-05-05 DIAGNOSIS — O365920 Maternal care for other known or suspected poor fetal growth, second trimester, not applicable or unspecified: Secondary | ICD-10-CM | POA: Diagnosis not present

## 2021-05-05 DIAGNOSIS — E669 Obesity, unspecified: Secondary | ICD-10-CM

## 2021-05-05 NOTE — Progress Notes (Addendum)
LOS: Day #15 Morgan Boyd 35 y.o. [id number] [redacted]w[redacted]d  Admitted 7/1 @ [redacted]w[redacted]d with Severe IUGR 1%, with echogenic bowel, GHTN, H/O fibroid.  S: PT continues to do well. Cautiously optimistic. No complaints. Denies HA and other neuro S/S. Reports a restful night. Current plan of care discussed.    O: Blood pressure 142/92, pulse 95, temperature 97.8 F temperature source Oral, resp. rate 16, height 5\' 3"  (1.6 m), weight 102.1 kg, last menstrual period 11/01/2020, SpO2 99 %.  Phys exam: FHR baseline 140/ minimal to moderate variability/ no accels/ no decels. Cat I tracing NST reviewed.   SVE deferred   Gen: AAO x3 CV: RRR, no additional sounds Resp: unlabored GI: soft, non tender, gravid GU: neg for vaginal bleeding and neg for LOF Extrem: neg edema, pain, tenderness, or cords  A/P: IUGR    -S/P magnesium IV for neuro protection    -Antenatal steroids complete 7/3    -NICU consult complete    -[place] and Doppler on 7/14-Breech/ fundal placenta/ AFI decreasing w/ largest pocket now 3.64 reduced from 4.3 on 05/01/21/ persistent reversed end-diastolic flow     -Last EFW 1# 1 oz (475 G) on 04/20/21   Fetus w/ echogenic bowel    -CMV and toxoplasmosis negative    -Cystic fibrosis screening-negative   GHTN    -Labetalol 100 mg BID    -normal to mild range BP, no severe range and no IV antihypertensives required    -continue baby aspirin 81 mg PO daily  MFM impression and recommendations based on 05/03/21 imaging: Ms. Kushnir is here for antenatal testing with known FGR  echogenic bowel, and GHTN with abnormal UA Dopplers  indices as well as he ductus venosus.  Today's exam is consistent with persentent reversed EDF,  however the ductuc venosus "a" wave appears slight  depressed by improved from prior exams.  NST have been reactive however, occasional decelerations  are present with return to baseline.  Her blood pressure is stable  Recommendations  Continue 3x weekly UA Dopplers   Continue NST TID  Repeat BMZ rescue course if fetal testing or maternal status  deteriorates or at 29 weeks  Repeat MgSO4 for neuroprotection 12 hours prior to delivery  Delivery recommended by 30 weeks if remains stable.  Plan discussed w/ Dr/ Adrian Prince, MSN, CNM 05/05/2021 10:27 AM  Patient seen and examined. Reports +FM. Will continue with plan as outlined above. Plan doppers TID

## 2021-05-05 NOTE — Progress Notes (Signed)
   05/05/21 1520  Clinical Encounter Type  Visited With Patient  Visit Type Spiritual support  Referral From Chaplain  Consult/Referral To [facility] requested I visit the patient. The patient was in the sunroom waiting for her husband, 37 yr old son, and 26 yr old daughter to visit. Our visit ended after she introduced me to her family. I engaged in reflective listening and understanding her fears that the baby would arrive early. She is tearful as she speaks of her hopes of carrying the baby for another three weeks. She is looking forward to another visit and teaching me how to play a spades card game. She appreciated coloring and being creative to take her mind off her pregnancy. She also enjoys talking about comical television shows on [place] or Netflix and her upbringing in [place], [place]. This note was prepared by Jeanine Luz, M.Div..  For questions please contact by phone [phone].

## 2021-05-06 MED ORDER — BETAMETHASONE SOD PHOS & ACET 6 (3-3) MG/ML IJ SUSP
12.0000 mg | INTRAMUSCULAR | Status: AC
  Administered 2021-05-06 – 2021-05-07 (×2): 12 mg via INTRAMUSCULAR
  Filled 2021-05-06 (×2): qty 5

## 2021-05-06 NOTE — Progress Notes (Addendum)
LOS: Day #16 LEYAN BRANDEN 35 y.o. [id number] [redacted]w[redacted]d   Admitted 7/1 @ [redacted]w[redacted]d with Severe IUGR 1%, with echogenic bowel, GHTN, H/O fibroid.   S: Pt expresses joy of seeing her husband and children on yesterday and remains in good spirits. Reviewed [place] report from 7/16. Denies HA and other neuro S/S. Reports a restful night. Current plan of care discussed and questions answered. Will consult w/ Dr. Delora Fuel RE: timing of rescue dose of betamethasone.     O: Blood pressure 142/92, pulse 95, temperature 97.8 F temperature source Oral, resp. rate 16, height 5\' 3"  (1.6 m), weight 102.1 kg, last menstrual period 11/01/2020, SpO2 99 %.   Phys exam: FHR baseline 140/ minimal to moderate variability/ 10x10 accels/ occasional variable w/ spont return to baseline.    SVE deferred   Gen: AAO x3 VC: HR 95 Resp: unlabored GI: soft, non tender, gravid GU: neg for vaginal bleeding and neg for LOF Extrem: neg edema, pain, tenderness, or cords   A/P: IUGR    -S/P magnesium IV for neuro protection    -Antenatal steroids complete 7/3, consider rescue dose per recommendation of Dr. Gertie Exon, will discuss w/ Dr. Delora Fuel    -NICU consult complete    -[place] and Doppler on 7/16-Breech/ anterior fundal placenta/ AFI decreasing w/ largest pocket now 4 increased from 3.64 on 05/03/21/ persistent reversed end-diastolic flow/ ductus venosus was intermittently reversed     -Last EFW 1# 1 oz (475 G) on 04/20/21   Fetus w/ echogenic bowel    -CMV and toxoplasmosis negative    -Cystic fibrosis screening-negative   GHTN    -Labetalol 100 mg BID    -normal to mild range BP, no severe range and no IV antihypertensives required    -continue baby aspirin 81 mg PO daily  7/16 Doppler study-Impression  Limited exam to assess UA and DV Dopplers given  reveresed EDF  Today the UA Dopplers are persistently reversed.  The ductus venosus was intermittently reversed today.  There is normal amniotic fluid volume however it  appears  subjectively decreased. ---------------------------------------------------------------------- Recommendations  Continue TID NST  Consider rescue BMZ give intermittent reversed ductus  venosus.  Will discuss report w/ Dr. Delora Fuel and adjust POC as needed.   Burman Foster, MSN, CNM 05/06/2021 1:18 PM   Attending Attestation: Patient seen today. Reviewed yesterday's [place] results. Given intermittent reversal in ductus venosus, benefits out weigh risk in repeat BMZ administration. BMZ ordered. Fetal heart tracing 145 baseline with minimal to moderate variability, - accels, occasional decelerations (stable from previous). No contractions. Next [place] on 05/07/21.

## 2021-05-07 ENCOUNTER — Inpatient Hospital Stay (HOSPITAL_BASED_OUTPATIENT_CLINIC_OR_DEPARTMENT_OTHER)

## 2021-05-07 DIAGNOSIS — D259 Leiomyoma of uterus, unspecified: Secondary | ICD-10-CM

## 2021-05-07 DIAGNOSIS — O09522 Supervision of elderly multigravida, second trimester: Secondary | ICD-10-CM

## 2021-05-07 DIAGNOSIS — O99212 Obesity complicating pregnancy, second trimester: Secondary | ICD-10-CM

## 2021-05-07 DIAGNOSIS — O365920 Maternal care for other known or suspected poor fetal growth, second trimester, not applicable or unspecified: Secondary | ICD-10-CM | POA: Diagnosis not present

## 2021-05-07 DIAGNOSIS — E669 Obesity, unspecified: Secondary | ICD-10-CM

## 2021-05-07 DIAGNOSIS — O43192 Other malformation of placenta, second trimester: Secondary | ICD-10-CM

## 2021-05-07 DIAGNOSIS — O283 Abnormal ultrasonic finding on antenatal screening of mother: Secondary | ICD-10-CM

## 2021-05-07 DIAGNOSIS — O3412 Maternal care for benign tumor of corpus uteri, second trimester: Secondary | ICD-10-CM

## 2021-05-07 DIAGNOSIS — Z3A26 26 weeks gestation of pregnancy: Secondary | ICD-10-CM

## 2021-05-07 NOTE — Progress Notes (Addendum)
Hospital day # 17 Admitted 7/1 @ [redacted]w[redacted]d with Severe IUGR <1% , with echogenic bowel, GHTN, H/O fibroid. Today 26.5 weeks.   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 No change in diagnosis made due to normal labs and no neruo s/sx present currently. Pt endorses +FM.         Perception of contractions: none      Vaginal bleeding: none now       Vaginal discharge:  no significant change  O: BP (!) 141/81 (BP Location: Right Arm)   Pulse (!) 106   Temp 98.8 F (37.1 C) (Oral)   Resp 18   Ht 5\' 3"  (1.6 m)   Wt 102.1 kg   LMP 11/01/2020   SpO2 97%   BMI 39.86 kg/m       Fetal tracings: Baseline 145s moderate variability, +acel 15x15, -decels, reactive for gestational age.       Contractions:   None      Uterus gravid and non-tender      Extremities: extremities normal, atraumatic, no cyanosis or edema, Homans sign is negative, no sign of DVT, no edema, redness or tenderness in the calves or thighs, and no significant edema and no signs of DVT       A/P: Hospital day # 17 Admitted 7/1 @ [redacted]w[redacted]d with Severe IUGR <1% , with echogenic bowel, GHTN, H/O fibroid. Today 26.5 weeks.   LOS# 17  Severe IUGR: Stable, s/p 24 hours magnesium on 7/1, NST reactive for gestational age. Repeat US/Doppler today pending. 7/16  Doppler study showed worsening with persistently reverse end diastolic flow with ductus venous showing a intermittent reversed flow breech, anterior placenta, AFI subjectively low 7/12 with MVP 4. 7/1, EFW 1.1lbs <1% with echogenic bowels, cmv and toxo neg, panoroma LR female, declined amniocentesis CF testing pending. Plan to repeat dopplers tri-weekly on this thurs, Saturday, then monday, wednesdays and fridays. Continue TID NST. BMZ completed 7/1-7/2. Repeat dose given 7/17, next dose due 7/18 @ 1700. Plan for PCS if delivery needed, also plan form  magnesium for neuroprotectant 12 hours prior to delivery. NICU consult complete.   GHTN: Pt stable, asymptomatic, BP 141/81, started on labetalol 100mg  BID PO on 7/10, required on 7/6 x1 dose IV labetalol, PCR 0.17 upon admission then 0.10 and PCR 0.11 on 7/9, and undetected on 7/12 and 7/15.  Unremarkable other labs, last drawn on 7/15. Had mild HA last 7/10 that resolved.  Vit D Deficiency: Stable, asymptomatic, on 1000IUn vit D, increased to 4000IU recommended during pregnancy. Vit D level on  7/4 was 34.19.   Echogenic Bowel: -CMV, -Tox, - CF carrier screen. Peds to Reassess PP.   Dr Alesia Richards updated.   [facility] CNM, MSN 05/07/2021 3:39  PM MD Addendum: I reviewed chart and agree with above findings, assessment and plan as outlined above by [place], MSN. I reviewed NST on 05/07/21 at 10.20 am: 140 BL, moderate variability, reactive by 10 X 10 criteria, few variable decelerations. TOCO: No contractions.    Dr. Alesia Richards.  05/07/2021.

## 2021-05-08 ENCOUNTER — Ambulatory Visit

## 2021-05-08 LAB — CBC WITH DIFFERENTIAL/PLATELET
Abs Immature Granulocytes: 0.15 10*3/uL — ABNORMAL HIGH (ref 0.00–0.07)
Basophils Absolute: 0 10*3/uL (ref 0.0–0.1)
Basophils Relative: 0 %
Eosinophils Absolute: 0 10*3/uL (ref 0.0–0.5)
Eosinophils Relative: 0 %
HCT: 35.6 % — ABNORMAL LOW (ref 36.0–46.0)
Hemoglobin: 11.7 g/dL — ABNORMAL LOW (ref 12.0–15.0)
Immature Granulocytes: 1 %
Lymphocytes Relative: 12 %
Lymphs Abs: 1.9 10*3/uL (ref 0.7–4.0)
MCH: 29 pg (ref 26.0–34.0)
MCHC: 32.9 g/dL (ref 30.0–36.0)
MCV: 88.3 fL (ref 80.0–100.0)
Monocytes Absolute: 0.7 10*3/uL (ref 0.1–1.0)
Monocytes Relative: 4 %
Neutro Abs: 13.5 10*3/uL — ABNORMAL HIGH (ref 1.7–7.7)
Neutrophils Relative %: 83 %
Platelets: 257 10*3/uL (ref 150–400)
RBC: 4.03 MIL/uL (ref 3.87–5.11)
RDW: 14.6 % (ref 11.5–15.5)
WBC: 16.3 10*3/uL — ABNORMAL HIGH (ref 4.0–10.5)
nRBC: 0.2 % (ref 0.0–0.2)

## 2021-05-08 LAB — COMPREHENSIVE METABOLIC PANEL
ALT: 49 U/L — ABNORMAL HIGH (ref 0–44)
AST: 34 U/L (ref 15–41)
Albumin: 2.7 g/dL — ABNORMAL LOW (ref 3.5–5.0)
Alkaline Phosphatase: 249 U/L — ABNORMAL HIGH (ref 38–126)
Anion gap: 6 (ref 5–15)
BUN: 6 mg/dL (ref 6–20)
CO2: 21 mmol/L — ABNORMAL LOW (ref 22–32)
Calcium: 8.8 mg/dL — ABNORMAL LOW (ref 8.9–10.3)
Chloride: 106 mmol/L (ref 98–111)
Creatinine, Ser: 0.59 mg/dL (ref 0.44–1.00)
GFR, Estimated: >60 mL/min (ref >60)
Glucose, Bld: 136 mg/dL — ABNORMAL HIGH (ref 70–99)
Potassium: 4 mmol/L (ref 3.5–5.1)
Sodium: 133 mmol/L — ABNORMAL LOW (ref 135–145)
Total Bilirubin: 0.3 mg/dL (ref 0.3–1.2)
Total Protein: 6.3 g/dL — ABNORMAL LOW (ref 6.5–8.1)

## 2021-05-08 LAB — PROTEIN / CREATININE RATIO, URINE
Creatinine, Urine: 78.44 mg/dL
Protein Creatinine Ratio: 0.09 mg/mg{Cre} (ref 0.00–0.15)
Total Protein, Urine: 7 mg/dL

## 2021-05-08 NOTE — Progress Notes (Signed)
Pt without complaints.  No leakage of fluid or VB.  Good FM  BP 133/74 (BP Location: Right Arm)   Pulse 92   Temp 99.1 F (37.3 C) (Oral)   Resp 16   Ht 5\' 3"  (1.6 m)   Wt 102.1 kg   LMP 11/01/2020   SpO2 98%   BMI 39.86 kg/m   FHTS Baseline: 150 bpm, Variability: Fair (1-6 bpm), and Accelerations: Non-reactive but appropriate for gestational age  37 none  Pt in NAD CV RRR Lungs CTAB abd  Gravid soft and NT GU no vb EXt no calf tenderness Results for orders placed or performed during the hospital encounter of 04/20/21 (from the past 72 hour(s))  Comprehensive metabolic panel     Status: Abnormal   Collection Time: 05/08/21  7:53 AM  Result Value Ref Range   Sodium 133 (L) 135 - 145 mmol/L   Potassium 4.0 3.5 - 5.1 mmol/L   Chloride 106 98 - 111 mmol/L   CO2 21 (L) 22 - 32 mmol/L   Glucose, Bld 136 (H) 70 - 99 mg/dL    Comment: Glucose reference range applies only to samples taken after fasting for at least 8 hours.   BUN 6 6 - 20 mg/dL   Creatinine, Ser 0.59 0.44 - 1.00 mg/dL   Calcium 8.8 (L) 8.9 - 10.3 mg/dL   Total Protein 6.3 (L) 6.5 - 8.1 g/dL   Albumin 2.7 (L) 3.5 - 5.0 g/dL   AST 34 15 - 41 U/L   ALT 49 (H) 0 - 44 U/L   Alkaline Phosphatase 249 (H) 38 - 126 U/L   Total Bilirubin 0.3 0.3 - 1.2 mg/dL   GFR, Estimated >60 >60 mL/min    Comment: (NOTE) Calculated using the CKD-EPI Creatinine Equation (2021)    Anion gap 6 5 - 15    Comment: Performed at [facility], [place] [address], [place], [place] [address]  CBC with Differential/Platelet     Status: Abnormal   Collection Time: 05/08/21  7:53 AM  Result Value Ref Range   WBC 16.3 (H) 4.0 - 10.5 K/uL   RBC 4.03 3.87 - 5.11 MIL/uL   Hemoglobin 11.7 (L) 12.0 - 15.0 g/dL   HCT 35.6 (L) 36.0 - 46.0 %   MCV 88.3 80.0 - 100.0 fL   MCH 29.0 26.0 - 34.0 pg   MCHC 32.9 30.0 - 36.0 g/dL   RDW 14.6 11.5 - 15.5 %   Platelets 257 150 - 400 K/uL   nRBC 0.2 0.0 - 0.2 %   Neutrophils Relative % 83 %   Neutro  Abs 13.5 (H) 1.7 - 7.7 K/uL   Lymphocytes Relative 12 %   Lymphs Abs 1.9 0.7 - 4.0 K/uL   Monocytes Relative 4 %   Monocytes Absolute 0.7 0.1 - 1.0 K/uL   Eosinophils Relative 0 %   Eosinophils Absolute 0.0 0.0 - 0.5 K/uL   Basophils Relative 0 %   Basophils Absolute 0.0 0.0 - 0.1 K/uL   Immature Granulocytes 1 %   Abs Immature Granulocytes 0.15 (H) 0.00 - 0.07 K/uL    Comment: Performed at [facility] [address] [place], [place] [address]  Protein / creatinine ratio, urine     Status: None   Collection Time: 05/08/21  8:50 AM  Result Value Ref Range   Creatinine, Urine 78.44 mg/dL   Total Protein, Urine 7 mg/dL    Comment: NO NORMAL RANGE ESTABLISHED FOR THIS TEST  Protein Creatinine Ratio 0.09 0.00 - 0.15 mg/mg[Cre]    Comment: Performed at [facility], [place] [address] [place], [place] [address]    Assessment and Plan [redacted]w[redacted]d  IUGR dopplers still showing reversed end diastolic flow.  Fetal monitoring is reassuring.  Will continue to monitor the pt  Lfts are slightly elevated.   Repeat labs tomorrow.  Blood pressure is stable on labetalol.  The pt is asympotmatic.

## 2021-05-09 ENCOUNTER — Inpatient Hospital Stay (HOSPITAL_BASED_OUTPATIENT_CLINIC_OR_DEPARTMENT_OTHER)

## 2021-05-09 DIAGNOSIS — O283 Abnormal ultrasonic finding on antenatal screening of mother: Secondary | ICD-10-CM | POA: Diagnosis not present

## 2021-05-09 DIAGNOSIS — O365920 Maternal care for other known or suspected poor fetal growth, second trimester, not applicable or unspecified: Secondary | ICD-10-CM

## 2021-05-09 DIAGNOSIS — Z362 Encounter for other antenatal screening follow-up: Secondary | ICD-10-CM

## 2021-05-09 DIAGNOSIS — O09522 Supervision of elderly multigravida, second trimester: Secondary | ICD-10-CM

## 2021-05-09 DIAGNOSIS — O321XX0 Maternal care for breech presentation, not applicable or unspecified: Secondary | ICD-10-CM

## 2021-05-09 DIAGNOSIS — E669 Obesity, unspecified: Secondary | ICD-10-CM

## 2021-05-09 DIAGNOSIS — O10012 Pre-existing essential hypertension complicating pregnancy, second trimester: Secondary | ICD-10-CM

## 2021-05-09 DIAGNOSIS — D259 Leiomyoma of uterus, unspecified: Secondary | ICD-10-CM

## 2021-05-09 DIAGNOSIS — O43192 Other malformation of placenta, second trimester: Secondary | ICD-10-CM

## 2021-05-09 DIAGNOSIS — O99212 Obesity complicating pregnancy, second trimester: Secondary | ICD-10-CM

## 2021-05-09 DIAGNOSIS — Z3A27 27 weeks gestation of pregnancy: Secondary | ICD-10-CM | POA: Diagnosis not present

## 2021-05-09 DIAGNOSIS — O3412 Maternal care for benign tumor of corpus uteri, second trimester: Secondary | ICD-10-CM

## 2021-05-09 LAB — COMPREHENSIVE METABOLIC PANEL
ALT: 46 U/L — ABNORMAL HIGH (ref 0–44)
AST: 27 U/L (ref 15–41)
Albumin: 2.6 g/dL — ABNORMAL LOW (ref 3.5–5.0)
Alkaline Phosphatase: 216 U/L — ABNORMAL HIGH (ref 38–126)
Anion gap: 7 (ref 5–15)
BUN: 6 mg/dL (ref 6–20)
CO2: 22 mmol/L (ref 22–32)
Calcium: 8.7 mg/dL — ABNORMAL LOW (ref 8.9–10.3)
Chloride: 105 mmol/L (ref 98–111)
Creatinine, Ser: 0.61 mg/dL (ref 0.44–1.00)
GFR, Estimated: >60 mL/min (ref >60)
Glucose, Bld: 93 mg/dL (ref 70–99)
Potassium: 3.9 mmol/L (ref 3.5–5.1)
Sodium: 134 mmol/L — ABNORMAL LOW (ref 135–145)
Total Bilirubin: 0.3 mg/dL (ref 0.3–1.2)
Total Protein: 6.4 g/dL — ABNORMAL LOW (ref 6.5–8.1)

## 2021-05-09 LAB — CBC
HCT: 33.9 % — ABNORMAL LOW (ref 36.0–46.0)
Hemoglobin: 11.2 g/dL — ABNORMAL LOW (ref 12.0–15.0)
MCH: 29 pg (ref 26.0–34.0)
MCHC: 33 g/dL (ref 30.0–36.0)
MCV: 87.8 fL (ref 80.0–100.0)
Platelets: 251 10*3/uL (ref 150–400)
RBC: 3.86 MIL/uL — ABNORMAL LOW (ref 3.87–5.11)
RDW: 15 % (ref 11.5–15.5)
WBC: 10.7 10*3/uL — ABNORMAL HIGH (ref 4.0–10.5)
nRBC: 0.6 % — ABNORMAL HIGH (ref 0.0–0.2)

## 2021-05-09 LAB — GROUP B STREP BY PCR: Group B strep by PCR: NEGATIVE

## 2021-05-09 NOTE — Consult Note (Signed)
Maternal-Fetal Medicine (Telephone Follow-up Consultation)  Name: Morgan Boyd DOB: June 27, 1986 MRN: [id number] Referring Provider: Everett Graff, MD   Morgan Boyd, [place] at [redacted] weeks gestation, was admitted 3 weeks ago with severe fetal growth restriction.  At initial evaluation, umbilical artery Doppler showed persistent absent end-diastolic flow.  Patient was counseled and had opted not to have amniocentesis.  Patient is taking labetalol 100 mg twice daily for hypertension and her blood pressures have been normal to mild hypertensive range.  Labs including platelets and creatinine were within normal range.  Recent ALT was slightly elevated. Patient had received antenatal corticosteroids.  Ultrasound On today's ultrasound, maximum vertical pocket of amniotic fluid measured 3 cm (normal) but amniotic fluid is subjectively decreased.  Breech presentation.  The estimated fetal weight is 526 g (1 pound and 3 ounces) or at the 1st percentile.  Interval weight gain over 3 weeks is 51 g.  Umbilical artery Doppler showed intermittent reversed end-diastolic flow (mostly reversed).  Ductus venosus study appeared normal.  I reviewed the NST that is reactive for this gestational age (94 x10).  I called the patient and discussed ultrasound findings.  Severe fetal growth restriction -Very poor interval growth is seen over 3 weeks. Severe fetal growth restriction (flattened growth curve) and persistent reversed-end-diastolic flow are associated with increased risk of stillbirth and perinatal mortality.  -All modalities of antenatal testing (NST and Doppler studies) have limitations in predicting fetal compromise.  -Ductus venosus Doppler study gives additional information on fetal compromise but it should not be used alone to decide on the timing of delivery (SMFM).  -If delivery is decided now, the infant will be extremely premature and growth restricted and these are associated with increased  neonatal death.  -Early onset fetal growth restriction is associated with fetal chromosomal anomalies or genetic conditions and, if present, these are associated with increased perinatal mortality and morbidity.  -Patient is having NST 3 times daily and ultrasound every other day.  -I counseled her that on the one hand, stillbirth rate is increased with continuation of pregnancy and on the other, delivery is associated with increased risk of neonatal death.  -If patient strongly wishes to be delivered, we will proceed with delivery now.  -I told her that it is a difficult decision to make. It is reasonable to continue her pregnancy now with daily NST monitoring and thrice-weekly Doppler studies. We will revisit next week. I discussed with Dr. Mancel Bale.  Recommendations -Continue NST (3 times daily). -UA Doppler Mon-Wed-Fri -Fetal growth assessment in 2 weeks if undelivered. -Delivery is indicated if non-reassuring NST (frequent or persistent decelerations) is present or if ductus venosus shows absent or reversed "a" wave (MFM decision). -Magnesium sulfate neuroprotection before delivery.  Thank you for consultation.  If you have any questions or concerns, please contact me the [facility].  Consultation 30 minutes.

## 2021-05-09 NOTE — Progress Notes (Signed)
This chaplain is present for F/U spiritual care as requested by Orbie Pyo.  The Pt. warmly greets the chaplain and is appreciative of spiritual care's ongoing presence.    The chaplain is updated by the Pt. RN-Chris before the visit. The chaplain understands the Pt. had an ultrasound this morning. The Pt. shares the ultrasound is a part of the Pt. "day by day" journey.  The chaplain learned the Pt. children will visit today. The chaplain recognizes it is breakfast time and steps away with the promise of intercessory prayer.

## 2021-05-09 NOTE — Progress Notes (Signed)
This chaplain responded to the RN page for Pt. spiritual care.    The chaplain offered reflective listening as the Pt. shared the today's emotional experience of listening and processing the medical team's update.  The chaplain understands the Pt. is looking forward to her husband presence in the discernment process.   The Pt. hope through her faith remains strong.  The Pt. identified scripture, journaling, prayer, and time with family as tools for coping.   The Pt. accepted the chaplain's invitation for F/U spiritual care and prayer.

## 2021-05-09 NOTE — Progress Notes (Signed)
Neonatal Medicine  05/09/2021 10:16 PM  I spoke again with [name], after seeing her 3 weeks ago in [facility].  I will discuss this with our other neonatologists before finishing the consultation note (by 7/21 morning).  ___________________ Roosevelt Locks, MD Attending Neonatologist

## 2021-05-09 NOTE — Progress Notes (Signed)
LOS: Day #19 Morgan Boyd 35 y.o. [id number] [redacted]w[redacted]d   Admitted 7/1 @ [redacted]w[redacted]d with Severe IUGR 1%, with echogenic bowel, GHTN, H/O fibroid.  Subjective:    Reports feeling overwhelmed today after her [place] and the call w/ Dr. Donalee Citrin. Pt states that she is unsure of how to proceed and struggles with decision making. Discussed plan to revisit NICU consult to address fetal outcomes at this gestation and pt agrees to second NICU consult. Spouse and children present.  Objective:    VS: BP 140/77 (BP Location: Left Arm)   Pulse 80   Temp 98.3 F (36.8 C) (Oral)   Resp 18   Ht 5\' 3"  (1.6 m)   Wt 102.1 kg   LMP 11/01/2020   SpO2 98%   BMI 39.86 kg/m  FHR : baseline 145 / variability minimal to moderate / accelerations 10x10 / variable decelerations w/ spont return to baseline   Gen: AAO x3 CV: RRR Resp: unlabored GI: soft, non tender, gravid GU: neg for vaginal bleeding and neg for LOF Extrem: neg edema, pain, tenderness, or cords   CMP Latest Ref Rng & Units 05/09/2021 05/08/2021 05/04/2021  Glucose 70 - 99 mg/dL 93 136(H) 89  BUN 6 - 20 mg/dL 6 6 5(L)  Creatinine 0.44 - 1.00 mg/dL 0.61 0.59 0.62  Sodium 135 - 145 mmol/L 134(L) 133(L) 134(L)  Potassium 3.5 - 5.1 mmol/L 3.9 4.0 3.9  Chloride 98 - 111 mmol/L 105 106 107  CO2 22 - 32 mmol/L 22 21(L) 21(L)  Calcium 8.9 - 10.3 mg/dL 8.7(L) 8.8(L) 8.9  Total Protein 6.5 - 8.1 g/dL 6.4(L) 6.3(L) 6.0(L)  Total Bilirubin 0.3 - 1.2 mg/dL 0.3 0.3 0.6  Alkaline Phos 38 - 126 U/L 216(H) 249(H) 261(H)  AST 15 - 41 U/L 27 34 27  ALT 0 - 44 U/L 46(H) 49(H) 34     Assessment/Plan:   35 y.o. [id number] [redacted]w[redacted]d  IUGR    -severe, now less than <1%    -BMZ rescue dose given 7/17 & 7/18    -repeat NICU consult     -Doppler on 7/20-Breech/ anterior fundal placenta/ AFI decreasing see note from Dr. Donalee Citrin for details     -EFW 1# 3 oz (526 G) on 05/09/21   Fetus w/ echogenic bowel    -CMV and toxoplasmosis negative    -Cystic fibrosis  screening-negative   GHTN    -Labetalol 100 mg BID    -mild range BP       -continue baby aspirin 81 mg PO daily    -increasing ALT, labs repeated this AM, will continue to monitor    Arrie Eastern MSN, CNM 05/09/2021 8:48 AM

## 2021-05-10 NOTE — Progress Notes (Signed)
MD Antepartum Note  Patient ID: Morgan Boyd, female   DOB: 07-Aug-1986, 35 y.o.   MRN: [id number]  Name: Morgan Boyd Medical Record Number:  [id number] Date of Birth: 08/12/1986 Date of Service: 05/10/2021  35 y.o. [id number] [redacted]w[redacted]d HD#20 admitted for IUGR (intrauterine growth restriction) affecting care of mother [O36.5990]. Pt currently stable with no complaints. Patient denies headache, blurry vision, or RUQ pain. She denies contractions, no vaginal bleeding, no leaking of fluid. Reports good FM.  The patient's past medical history and prenatal records were reviewed.  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  Assessment: HD#20  [redacted]w[redacted]d with severe IUGR and gestational hypertension.  Plan: Patient placed in continuous monitoring today for breaks in the strip, however, this was fetal movement. NST however now is reassuring Continue PO labetalol BID and baby Asprin daily Will check cmp tomorrow and continue to monitor for s/sx of pre-eclampsia She is steroid complete, delivery if Category III fetal tracing.   Rogelio Seen Evangaline Jou

## 2021-05-10 NOTE — Progress Notes (Addendum)
LOS: Day #20 Morgan Boyd 36 y.o. [id number] [redacted]w[redacted]d   Admitted 7/1 @ [redacted]w[redacted]d with Severe IUGR 1%, with echogenic bowel, GHTN, H/O fibroid.  S: Sleeping soundly, did not awaken during AM rounds.   O: Today's Vitals   05/09/21 2209 05/09/21 2308 05/09/21 2355 05/10/21 0345  BP:  (!) 144/84  135/77  Pulse:  87  88  Resp:  16  16  Temp:  99.2 F (37.3 C)  99 F (37.2 C)  TempSrc:  Oral  Oral  SpO2:  98%  95%  Weight:      Height:      PainSc: 0-No pain  0-No pain    Body mass index is 39.86 kg/m. FHR : baseline 145 / variability moderate / accelerations none/ decelerations none Gen: NAD  A/P: 35 y.o. [id number] [redacted]w[redacted]d  IUGR    -severe, now less than <1%    -BMZ rescue dose given 7/17 & 7/18    -repeat NICU consult (consult note pending)    -Doppler on 7/20-Breech/ anterior fundal placenta/ AFI decreasing see note from Dr. Donalee Citrin for details     -EFW 1# 3 oz (526 G) on 05/09/21. 51 g weight gain since 04/20/21   Fetus w/ echogenic bowel    -CMV and toxoplasmosis negative    -Cystic fibrosis screening-negative   GHTN    -Labetalol 100 mg BID    -mild range BP       -continue baby aspirin 81 mg PO daily    -increasing ALT, repeat CMP ordered for 7/22

## 2021-05-10 NOTE — Consult Note (Signed)
Neonatal Medicine Follow-up Consultation  05/10/2021 9:51 AM  Mare Loan [id number]  [name] requested neonatology return to her room for additional questions  that have arisen since I first saw  her 3 weeks ago.  I have reviewed her chart to be up-to-date with hospital course.  The biggest concern appears to be the relative lack of growth.  EFW on 7/1 was 475 grams (24 weeks).  On 7/20 the EFW was 526 grams (27 weeks).  This is a growth of 51 grams which averages out to a gain of 3 grams per day.  The other major concern is the persistent reversal of end-diastolic flow in the umbilical artery.  On the reassuring side, the fetus is 3 weeks more mature and has been having good NST studies and activity levels of the fetus.  The ductus venosus study was reassuring.  Two courses of betamethasone have been given.    It is difficult to see the best course of action at this time.  Maintaining the pregnancy risks having a stillbirth.  Delivering now would have a high mortality and morbidity from the degree of immaturity combined with the poor growth.  Three weeks ago the decisions to monitor and support made more sense than the delivery of a 24 or 25 week baby with these additional problems.  Now it is less obvious.  My neonatology colleagues (Dr. Karmen Stabs and Dr. Katherina Mires) and I believe that the baby's prognosis is better by staying undelivered as long as there are reassuring factors such as the NST's and fetal monitoring that excludes frequent or worsening bradycardia events.  Mom reports good fetal movements.  The lack of growth, although concerning, does not yet appear to be having a negative affect on these reassuring factors.  But ultimately what is driving the poor growth is likely to lead to clear evidence that the in utero environment is no longer supportive. We agree with the recommendation to perform NST's three times daily, doppler ultrasound of umbilical artery on MWF, and  reassessment of fetal growth in 2 weeks.  If non-stress testing or the ductus venosus study becomes non-reassuring, prompt delivery would be best.    Thank you for requesting our input, and giving [place] the opportunity to meet this couple.  Please let us know if we can be of further help.  ___________________ Roosevelt Locks, MD Attending Neonatologist 05/10/2021     10:24 AM

## 2021-05-11 ENCOUNTER — Inpatient Hospital Stay (HOSPITAL_BASED_OUTPATIENT_CLINIC_OR_DEPARTMENT_OTHER)

## 2021-05-11 ENCOUNTER — Ambulatory Visit

## 2021-05-11 DIAGNOSIS — O132 Gestational [pregnancy-induced] hypertension without significant proteinuria, second trimester: Secondary | ICD-10-CM | POA: Diagnosis not present

## 2021-05-11 DIAGNOSIS — E669 Obesity, unspecified: Secondary | ICD-10-CM

## 2021-05-11 DIAGNOSIS — O365920 Maternal care for other known or suspected poor fetal growth, second trimester, not applicable or unspecified: Secondary | ICD-10-CM | POA: Diagnosis not present

## 2021-05-11 DIAGNOSIS — O09522 Supervision of elderly multigravida, second trimester: Secondary | ICD-10-CM

## 2021-05-11 DIAGNOSIS — O3412 Maternal care for benign tumor of corpus uteri, second trimester: Secondary | ICD-10-CM | POA: Diagnosis not present

## 2021-05-11 DIAGNOSIS — O43192 Other malformation of placenta, second trimester: Secondary | ICD-10-CM

## 2021-05-11 DIAGNOSIS — D259 Leiomyoma of uterus, unspecified: Secondary | ICD-10-CM

## 2021-05-11 DIAGNOSIS — O283 Abnormal ultrasonic finding on antenatal screening of mother: Secondary | ICD-10-CM | POA: Diagnosis not present

## 2021-05-11 DIAGNOSIS — Z3A27 27 weeks gestation of pregnancy: Secondary | ICD-10-CM

## 2021-05-11 DIAGNOSIS — O99212 Obesity complicating pregnancy, second trimester: Secondary | ICD-10-CM

## 2021-05-11 DIAGNOSIS — O321XX0 Maternal care for breech presentation, not applicable or unspecified: Secondary | ICD-10-CM

## 2021-05-11 LAB — CBC WITH DIFFERENTIAL/PLATELET
Abs Immature Granulocytes: 0.08 10*3/uL — ABNORMAL HIGH (ref 0.00–0.07)
Basophils Absolute: 0 10*3/uL (ref 0.0–0.1)
Basophils Relative: 0 %
Eosinophils Absolute: 0.2 10*3/uL (ref 0.0–0.5)
Eosinophils Relative: 1 %
HCT: 36.4 % (ref 36.0–46.0)
Hemoglobin: 12.3 g/dL (ref 12.0–15.0)
Immature Granulocytes: 1 %
Lymphocytes Relative: 32 %
Lymphs Abs: 3.7 10*3/uL (ref 0.7–4.0)
MCH: 29.7 pg (ref 26.0–34.0)
MCHC: 33.8 g/dL (ref 30.0–36.0)
MCV: 87.9 fL (ref 80.0–100.0)
Monocytes Absolute: 0.8 10*3/uL (ref 0.1–1.0)
Monocytes Relative: 7 %
Neutro Abs: 6.9 10*3/uL (ref 1.7–7.7)
Neutrophils Relative %: 59 %
Platelets: 251 10*3/uL (ref 150–400)
RBC: 4.14 MIL/uL (ref 3.87–5.11)
RDW: 14.8 % (ref 11.5–15.5)
WBC: 11.8 10*3/uL — ABNORMAL HIGH (ref 4.0–10.5)
nRBC: 0.9 % — ABNORMAL HIGH (ref 0.0–0.2)

## 2021-05-11 LAB — COMPREHENSIVE METABOLIC PANEL
ALT: 30 U/L (ref 0–44)
AST: 26 U/L (ref 15–41)
Albumin: 2.7 g/dL — ABNORMAL LOW (ref 3.5–5.0)
Alkaline Phosphatase: 238 U/L — ABNORMAL HIGH (ref 38–126)
Anion gap: 8 (ref 5–15)
BUN: 7 mg/dL (ref 6–20)
CO2: 20 mmol/L — ABNORMAL LOW (ref 22–32)
Calcium: 8.7 mg/dL — ABNORMAL LOW (ref 8.9–10.3)
Chloride: 108 mmol/L (ref 98–111)
Creatinine, Ser: 0.59 mg/dL (ref 0.44–1.00)
GFR, Estimated: >60 mL/min (ref >60)
Glucose, Bld: 91 mg/dL (ref 70–99)
Potassium: 4.2 mmol/L (ref 3.5–5.1)
Sodium: 136 mmol/L (ref 135–145)
Total Bilirubin: 0.4 mg/dL (ref 0.3–1.2)
Total Protein: 6 g/dL — ABNORMAL LOW (ref 6.5–8.1)

## 2021-05-11 LAB — PROTEIN / CREATININE RATIO, URINE
Creatinine, Urine: 91.47 mg/dL
Protein Creatinine Ratio: 0.11 mg/mg{Cre} (ref 0.00–0.15)
Total Protein, Urine: 10 mg/dL

## 2021-05-11 NOTE — Progress Notes (Signed)
   05/11/21 1200  Clinical Encounter Type  Visited With Patient  Visit Type Follow-up;Social support;Spiritual support  Referral From Chaplain  Consult/Referral To [employer] visited and engaged in reflective listening as the patient spoke about the baby. She is pleased that the baby's heart rate is good; however, they are concerned that the baby is not growing.  She stated she and her husband sometimes feel like doctors want them to give up and talk to steer them in the direction of aborting the pregnancy. She wants to hold onto hope as long as the baby's heart rate is strong. She is looking forward to her parents visiting this weekend. She spoke of being strong in her faith and appreciates that someone from her church's intercessory prayer team calls every day. She misses being home with her husband and children. She is proud that her husband has stepped up in taking care of the children and their home. We prayed and she hugged me before I left. This note was prepared by Jeanine Luz, M.Div..  For questions please contact by phone [phone].   Marland Kitchen

## 2021-05-11 NOTE — Consult Note (Signed)
Maternal-Fetal Medicine (Follow-up consultation) 05/11/21 3:15 PM  Name: Morgan Boyd DOB: 1986/06/04 [id number] Referring Provider: Sanjuana Kava, MD  I had the pleasure of seeing Morgan Boyd and her husband at the ultrasound unit, [facility]. Today, we revisited the diagnosis, possible causes, pregnancy management and delivery indications. Briefly, I counseled them on postnatal outcomes.  She is 27w 2d pregnant today. Patient has gestational hypertension and takes labetalol for control. Her blood pressures have been normal to mild hypertensive range. She does not have symptoms and signs of severe features of preeclampsia. Labs including liver enzymes, creatinine and platelets are within normal range. Patient had received 2 doses of antenatal corticosteroids. She has not had screening for fetal aneuploidies.   Patient came to the center for Maternal Fetal care on 04/18/21 for ultrasound and severe fetal growth restriction with abnormal umbilical artery Doppler (absent end-diastolic flow) was seen. She was admitted for inpatient management. During inpatient stay, UA Doppler had progressed to reversed-end-diastolic flow (REDF). Fetal growth assessment 2 days ago showed a very poor interval weight gain of 51 grams over 3 weeks.  She had neonatal consultation. I talked to her 2 days ago on the phone about fetal weight gain.  Ultrasound: On today's ultrasound, oligohydramnios is seen and only a single maximum vertical pocket measuring 3 cm was seen. Breech presentation. No evidence of cardiomegaly or ascites. Umbilical artery Doppler showed persistent reversed end-diastolic flow. Ductus venosus study showed no absent or reversed "a" wave. Fetal bladder is seen.  I counseled the couple on the following: -Definition of severe fetal growth restriction. Most-likely cause is uteroplacental insufficiency. Other causes include fetal chromosomal anomaly or genetic syndromes, fetal infection or maternal medical  conditions. Only amniocentesis will give information on chromosomal and some, not all, genetic conditions.  -Gestational hypertension and fetal growth restriction are the consequences of implantation defect. -Discussed the significance of ultrasound fetal monitoring including Doppler studies, their significance, and limitations in predicting fetal compromise.   -Severe fetal growth restriction is associated with increased risk of stillbirth, neonatal death and perinatal morbidities. The infant is at a higher risk of having poor neurodevelopmental outcomes including cerebral palsy.  -Persistent REDF places the fetus at a higher risk of neurological complications after birth. Oligohydramnios is the result of severe growth restriction and it alone is not indication to delivery. It can increase the risk of cord compression.  -We discussed timing of delivery. Studies have shown that best predictors for survival or birthweight over 600 grams or gestational age [redacted] weeks or greater.  -Patient is aware that continuation of pregnancy with REDF and no interval weight gain place the fetus at a very high risk of stillbirth. Delivery at this gestational age is associated with increased risk of neonatal death and extreme premature complications.  -Patient understands that the timing of delivery decision is not clear cut given the clinical picture. Patient's wishes are important in delivery decisions. This is not to place burden on the couple to decide but to be aware of the difficulties in predicting outcomes.  -The couple is comfortable in delaying delivery, and they understand that the risk of stillbirth is higher.   -Continuous NST monitoring is not recommended, and emergency cesarean section is more likely if transient decelerations are seen.  -Classical cesarean section is more likely.  -I briefly discussed management of gestational hypertension. Antihypertensives prevent maternal complications but do not  improve fetal outcome.  -No intervention exists for fetal growth restriction. Sildenafil treatment has not shown improvement in one trial.  Recommendations -Continue thrice-daily NST. -Continue UA Doppler studies on M-W-F. -Magnesium sulfate infusion for fetal neuroprotection before delivery. -Prophylactic anticoagulation if patient is not ambulating. I encouraged her to ambulate (no restriction) to prevent venous thromboembolism. -Delayed cord clamping unless emergency cesarean section was performed for prolonged decelerations or placental abruption.   Thank you for consultation.  If you have any questions or concerns, please contact me the [facility].  Consultation including face-to-face counseling 60 minutes.

## 2021-05-11 NOTE — Progress Notes (Addendum)
LOS: Day #20 Morgan Boyd 35 y.o. [id number] [redacted]w[redacted]d  Admitted 7/1 @ 230w2dith Severe IUGR 1%, with echogenic bowel, GHTN, H/O fibroid  S:  Feels much better about POC since NICU consult. Reports that she has a better understanding of indicators for delivery and feels that she has done everything within her "power" to improve the birth outcome for "Morgan Boyd". Denies HA, visual changes, and RUQ pain. Has no complaints or concerns and feels comfortable requesting to have EFM applied between scheduled TID testing.   O: Blood pressure 133/80, pulse 78, temperature 99.2 F (37.3 C), temperature source Oral, resp. rate 18, height '5\' 3"'$  (1.6 m), weight 102.1 kg, last menstrual period 11/01/2020, SpO2 98 %.  FHR : baseline 145 / variability moderate / accelerations none/ decelerations none Gen: NAD Resp: unlabored GI: soft, non-tender, gravid GU: neg for VB and LOF Ext: no edema, pain, tenderness, or cords  A/P: [age].o. G5OQ:[id number]    -severe, now less than <1%    -BMZ rescue dose given 7/17 & 7/18    -repeat NICU consult completed 7/21    -Doppler on 7/20-Breech/ anterior fundal placenta/ AFI decreasing see note from Dr. ShaDonalee Citrinr details, repeat sched for 05/11/21     -EFW 1# 3 oz (526 G) on 05/09/21. 51 g weight gain since 04/20/21   Fetus w/ echogenic bowel    -CMV and toxoplasmosis negative    -Cystic fibrosis screening-negative   GHTN    -Labetalol 100 mg BID    -mild range BP       -continue baby aspirin 81 mg PO daily    -monitor for S/S pre-e  GBS neg  VivBurman FosterSN, CNM 05/11/2021 6:55 AM  MD Addendum: I saw and examined patient at bedside and agree with above findings, assessment and plan as outlined above by VivBurman FosterSN, CNM.  NST today was reviewed at 1700 showing EFM 150, minimal to moderate variability, reactive by 10 x 10 criteria, few variable decelerations noted. TOCO: No contractions. Ultrasound dopplers done today is unchanged as per MFM  consultation and notes. Continue with current care.  Dr. EmaWaymon Amato7/22/22.  2025.

## 2021-05-12 NOTE — Progress Notes (Signed)
LOS: Day #22 Morgan Boyd 35 y.o. [id number] [redacted]w[redacted]d  Admitted 7/1 @ 249w2dith Severe IUGR 1%, with echogenic bowel, GHTN, H/O fibroid.   S: Rounded and pt was ambulating outside. Went back this afternoon and Morgan Boyd resting in bed. Reports very happy for another day. Mood is pleasant. Had NICU consult on 05/10/21. No questions or complaints.   O: BP (!) 147/87 (BP Location: Right Arm)   Pulse 78   Temp 98.3 F (36.8 C) (Oral)   Resp 18   Ht '5\' 3"'$  (1.6 m)   Wt 102.1 kg   LMP 11/01/2020   SpO2 100%   BMI 39.86 kg/m    FHR : baseline 145 / variability mild to moderate / accelerations none/ decelerations none Gen: NAD Resp: unlabored GI: soft, non-tender, gravid GU: neg for VB and LOF Ext: no edema, pain, tenderness, or cords  A/P: [age].o. G5OQ:[id number]    -severe, now less than <1%    -BMZ rescue dose given 7/17 & 7/18    -repeat NICU consult 05/10/21    -Doppler on 7/20-Breech/ anterior fundal placenta/ AFI decreasing see note from Dr. ShaDonalee Citrinr details     -EFW 1# 3 oz (526 G) on 05/09/21. 51 g weight gain since 04/20/21   Fetus w/ echogenic bowel    -CMV and toxoplasmosis negative    -Cystic fibrosis screening-negative   GHTN    -Labetalol 100 mg BID    -mild range BP       -continue baby aspirin 81 mg PO daily    -CMP ordered for 7/22 WNL  GBS- negative

## 2021-05-13 NOTE — Progress Notes (Addendum)
Received call from RN to review fetal tracing. FHR 145, minimal variability. Occasional variable. No accelerations. Had Decel to 100bpm x 2 min, with spontaneous return to baseline. Encouraged position change and po fluids. Will continue to monitor. Dr Landry Mellow notified. Reviewed fetal tracing and POC with Dr Landry Mellow.   Holli Humbles, CNM 05/13/21 1113pm

## 2021-05-13 NOTE — Progress Notes (Signed)
LOS: Day #23 Morgan Boyd 35 y.o. [id number] [redacted]w[redacted]d  Admitted 7/1 @ 231w2dith Severe IUGR 1%, with echogenic bowel, GHTN, H/O fibroid.   S: Resting quietly in bed. Reports good having a good night sleep. Mood is pleasant. Had NICU consult on 05/10/21. No questions or complaints.   O: BP (!) 147/87 (BP Location: Right Arm)   Pulse 78   Temp 98.3 F (36.8 C) (Oral)   Resp 18   Ht '5\' 3"'$  (1.6 m)   Wt 102.1 kg   LMP 11/01/2020   SpO2 100%   BMI 39.86 kg/m     FHR : baseline 145 / variability mild to moderate / accelerations none/ occasional variables last night. Pt reports fetus very active at time.  Gen: NAD Resp: unlabored GI: soft, non-tender, gravid GU: neg for VB and LOF Ext: no edema, pain, tenderness, or cords   A/P: [age].o. G5OQ:[id number]    -severe, now less than <1%    -BMZ rescue dose given 7/17 & 7/18    -repeat NICU consult 05/10/21    -Doppler on 7/20-Breech/ anterior fundal placenta/ AFI decreasing see note from Dr. ShaDonalee Citrinr details     -EFW 1# 3 oz (526 G) on 05/09/21. 51 g weight gain since 04/20/21   Fetus w/ echogenic bowel    -CMV and toxoplasmosis negative    -Cystic fibrosis screening-negative   GHTN    -Labetalol 100 mg BID    -mild range BP       -continue baby aspirin 81 mg PO daily    -CMP ordered for 7/22 WNL   GBS- negative  Dr ColLandry Melloware of pt status and continued POCCherrylandNM 05/13/21 1038AM

## 2021-05-14 ENCOUNTER — Encounter (HOSPITAL_COMMUNITY): Payer: Self-pay | Admitting: Obstetrics & Gynecology

## 2021-05-14 ENCOUNTER — Encounter (HOSPITAL_COMMUNITY)
Admission: AD | Disposition: A | Discharge status: Home / Self Care | Payer: Self-pay | Source: Home / Self Care | Attending: Obstetrics & Gynecology

## 2021-05-14 ENCOUNTER — Inpatient Hospital Stay (HOSPITAL_COMMUNITY): Admitting: Anesthesiology

## 2021-05-14 LAB — CBC
HCT: 40.2 % (ref 36.0–46.0)
Hemoglobin: 13.4 g/dL (ref 12.0–15.0)
MCH: 29.8 pg (ref 26.0–34.0)
MCHC: 33.3 g/dL (ref 30.0–36.0)
MCV: 89.5 fL (ref 80.0–100.0)
Platelets: 228 10*3/uL (ref 150–400)
RBC: 4.49 MIL/uL (ref 3.87–5.11)
RDW: 15.3 % (ref 11.5–15.5)
WBC: 9.8 10*3/uL (ref 4.0–10.5)
nRBC: 0.3 % — ABNORMAL HIGH (ref 0.0–0.2)

## 2021-05-14 LAB — RPR: RPR Ser Ql: NONREACTIVE

## 2021-05-14 LAB — TYPE AND SCREEN
ABO/RH(D): A POS
Antibody Screen: NEGATIVE

## 2021-05-14 SURGERY — Surgical Case
Anesthesia: Spinal

## 2021-05-14 MED ORDER — PHENYLEPHRINE HCL-NACL 20-0.9 MG/250ML-% IV SOLN
INTRAVENOUS | Status: DC | PRN
  Administered 2021-05-14: 60 ug/min via INTRAVENOUS

## 2021-05-14 MED ORDER — ONDANSETRON HCL 4 MG/2ML IJ SOLN
INTRAMUSCULAR | Status: AC
  Filled 2021-05-14: qty 2

## 2021-05-14 MED ORDER — MAGNESIUM SULFATE BOLUS VIA INFUSION
4.0000 g | Freq: Once | INTRAVENOUS | Status: AC
  Administered 2021-05-14: 4 g via INTRAVENOUS
  Filled 2021-05-14: qty 1000

## 2021-05-14 MED ORDER — SENNOSIDES-DOCUSATE SODIUM 8.6-50 MG PO TABS
2.0000 | ORAL_TABLET | Freq: Every day | ORAL | Status: DC
  Administered 2021-05-15: 2 via ORAL
  Filled 2021-05-14 (×3): qty 2

## 2021-05-14 MED ORDER — ACETAMINOPHEN 500 MG PO TABS
1000.0000 mg | ORAL_TABLET | Freq: Four times a day (QID) | ORAL | Status: DC
  Administered 2021-05-14 – 2021-05-15 (×3): 1000 mg via ORAL
  Filled 2021-05-14 (×3): qty 2

## 2021-05-14 MED ORDER — DEXAMETHASONE SODIUM PHOSPHATE 10 MG/ML IJ SOLN
INTRAMUSCULAR | Status: AC
  Filled 2021-05-14: qty 1

## 2021-05-14 MED ORDER — ONDANSETRON HCL 4 MG/2ML IJ SOLN
INTRAMUSCULAR | Status: DC | PRN
  Administered 2021-05-14: 4 mg via INTRAVENOUS

## 2021-05-14 MED ORDER — SODIUM CHLORIDE 0.9% FLUSH: 3.0000 mL | INTRAVENOUS | Status: DC | PRN

## 2021-05-14 MED ORDER — MAGNESIUM SULFATE 40 GM/1000ML IV SOLN
2.0000 g/h | INTRAVENOUS | Status: AC
Start: 2021-05-14 — End: 2021-05-15
  Administered 2021-05-14: 1 g/h via INTRAVENOUS
  Administered 2021-05-14: 2 g/h via INTRAVENOUS
  Filled 2021-05-14 (×2): qty 1000

## 2021-05-14 MED ORDER — DIPHENHYDRAMINE HCL 50 MG/ML IJ SOLN
INTRAMUSCULAR | Status: AC
  Filled 2021-05-14: qty 1

## 2021-05-14 MED ORDER — NALBUPHINE HCL 10 MG/ML IJ SOLN: 5.0000 mg | Freq: Once | INTRAMUSCULAR | Status: DC | PRN

## 2021-05-14 MED ORDER — OXYTOCIN-SODIUM CHLORIDE 30-0.9 UT/500ML-% IV SOLN
INTRAVENOUS | Status: DC | PRN
  Administered 2021-05-14: 200 mL via INTRAVENOUS
  Administered 2021-05-14: 300 mL via INTRAVENOUS

## 2021-05-14 MED ORDER — ACETAMINOPHEN 500 MG PO TABS: 1000.0000 mg | ORAL_TABLET | Freq: Once | ORAL | Status: DC

## 2021-05-14 MED ORDER — PRENATAL MULTIVITAMIN CH
1.0000 | ORAL_TABLET | Freq: Every day | ORAL | Status: DC
  Administered 2021-05-14 – 2021-05-17 (×4): 1 via ORAL
  Filled 2021-05-14 (×4): qty 1

## 2021-05-14 MED ORDER — CEFAZOLIN SODIUM 1 G IJ SOLR
INTRAMUSCULAR | Status: AC
  Filled 2021-05-14: qty 10

## 2021-05-14 MED ORDER — KETOROLAC TROMETHAMINE 30 MG/ML IJ SOLN: 30.0000 mg | Freq: Four times a day (QID) | INTRAMUSCULAR | Status: AC | PRN

## 2021-05-14 MED ORDER — METOCLOPRAMIDE HCL 5 MG/ML IJ SOLN
INTRAMUSCULAR | Status: AC
  Filled 2021-05-14: qty 2

## 2021-05-14 MED ORDER — MORPHINE SULFATE (PF) 0.5 MG/ML IJ SOLN
INTRAMUSCULAR | Status: AC
  Filled 2021-05-14: qty 10

## 2021-05-14 MED ORDER — NALOXONE HCL 4 MG/10ML IJ SOLN
1.0000 ug/kg/h | INTRAVENOUS | Status: DC | PRN
  Filled 2021-05-14: qty 5

## 2021-05-14 MED ORDER — OXYTOCIN-SODIUM CHLORIDE 30-0.9 UT/500ML-% IV SOLN
2.5000 [IU]/h | INTRAVENOUS | Status: AC
  Administered 2021-05-14: 2.5 [IU]/h via INTRAVENOUS
  Filled 2021-05-14: qty 500

## 2021-05-14 MED ORDER — TETANUS-DIPHTH-ACELL PERTUSSIS 5-2.5-18.5 LF-MCG/0.5 IM SUSY
0.5000 mL | PREFILLED_SYRINGE | Freq: Once | INTRAMUSCULAR | Status: AC
  Administered 2021-05-17: 0.5 mL via INTRAMUSCULAR
  Filled 2021-05-14: qty 0.5

## 2021-05-14 MED ORDER — ACETAMINOPHEN 500 MG PO TABS: 1000.0000 mg | ORAL_TABLET | Freq: Four times a day (QID) | ORAL | Status: DC

## 2021-05-14 MED ORDER — NALOXONE HCL 0.4 MG/ML IJ SOLN: 0.4000 mg | INTRAMUSCULAR | Status: DC | PRN

## 2021-05-14 MED ORDER — DEXAMETHASONE SODIUM PHOSPHATE 10 MG/ML IJ SOLN
INTRAMUSCULAR | Status: DC | PRN
  Administered 2021-05-14: 10 mg via INTRAVENOUS

## 2021-05-14 MED ORDER — LACTATED RINGERS IV BOLUS
500.0000 mL | Freq: Once | INTRAVENOUS | Status: AC
  Administered 2021-05-14: 500 mL via INTRAVENOUS

## 2021-05-14 MED ORDER — ACETAMINOPHEN 160 MG/5ML PO SOLN: 1000.0000 mg | Freq: Once | ORAL | Status: DC

## 2021-05-14 MED ORDER — SOD CITRATE-CITRIC ACID 500-334 MG/5ML PO SOLN: 30.0000 mL | Freq: Once | ORAL | Status: DC

## 2021-05-14 MED ORDER — ALBUMIN HUMAN 5 % IV SOLN: INTRAVENOUS | Status: DC | PRN

## 2021-05-14 MED ORDER — DEXMEDETOMIDINE (PRECEDEX) IN NS 20 MCG/5ML (4 MCG/ML) IV SYRINGE
PREFILLED_SYRINGE | INTRAVENOUS | Status: AC
  Filled 2021-05-14: qty 5

## 2021-05-14 MED ORDER — COCONUT OIL OIL
1.0000 "application " | TOPICAL_OIL | Status: DC | PRN
  Administered 2021-05-14: 1 via TOPICAL

## 2021-05-14 MED ORDER — DIBUCAINE (PERIANAL) 1 % EX OINT: 1.0000 "application " | TOPICAL_OINTMENT | CUTANEOUS | Status: DC | PRN

## 2021-05-14 MED ORDER — SIMETHICONE 80 MG PO CHEW
80.0000 mg | CHEWABLE_TABLET | Freq: Three times a day (TID) | ORAL | Status: DC
  Administered 2021-05-14 – 2021-05-17 (×10): 80 mg via ORAL
  Filled 2021-05-14 (×10): qty 1

## 2021-05-14 MED ORDER — ONDANSETRON HCL 4 MG/2ML IJ SOLN: 4.0000 mg | Freq: Three times a day (TID) | INTRAMUSCULAR | Status: DC | PRN

## 2021-05-14 MED ORDER — BUPIVACAINE IN DEXTROSE 0.75-8.25 % IT SOLN
INTRATHECAL | Status: DC | PRN
Start: 2021-05-14 — End: 2021-05-14
  Administered 2021-05-14: 1.6 mg via INTRATHECAL

## 2021-05-14 MED ORDER — DIPHENHYDRAMINE HCL 50 MG/ML IJ SOLN: 12.5000 mg | Freq: Four times a day (QID) | INTRAMUSCULAR | Status: DC | PRN

## 2021-05-14 MED ORDER — NALBUPHINE HCL 10 MG/ML IJ SOLN: 5.0000 mg | INTRAMUSCULAR | Status: DC | PRN

## 2021-05-14 MED ORDER — PHENYLEPHRINE 40 MCG/ML (10ML) SYRINGE FOR IV PUSH (FOR BLOOD PRESSURE SUPPORT): PREFILLED_SYRINGE | INTRAVENOUS | Status: DC | PRN

## 2021-05-14 MED ORDER — WITCH HAZEL-GLYCERIN EX PADS: 1.0000 "application " | MEDICATED_PAD | CUTANEOUS | Status: DC | PRN

## 2021-05-14 MED ORDER — LACTATED RINGERS IV SOLN: INTRAVENOUS | Status: DC

## 2021-05-14 MED ORDER — GABAPENTIN 100 MG PO CAPS
100.0000 mg | ORAL_CAPSULE | Freq: Two times a day (BID) | ORAL | Status: DC | PRN
  Administered 2021-05-14: 100 mg via ORAL
  Filled 2021-05-14: qty 1

## 2021-05-14 MED ORDER — CEFAZOLIN SODIUM-DEXTROSE 2-3 GM-%(50ML) IV SOLR
INTRAVENOUS | Status: DC | PRN
  Administered 2021-05-14: 2 g via INTRAVENOUS

## 2021-05-14 MED ORDER — OXYCODONE HCL 5 MG PO TABS
5.0000 mg | ORAL_TABLET | ORAL | Status: DC | PRN
  Administered 2021-05-15: 5 mg via ORAL
  Filled 2021-05-14: qty 1

## 2021-05-14 MED ORDER — DIPHENHYDRAMINE HCL 25 MG PO CAPS: 25.0000 mg | ORAL_CAPSULE | Freq: Four times a day (QID) | ORAL | Status: DC | PRN

## 2021-05-14 MED ORDER — FENTANYL CITRATE (PF) 250 MCG/5ML IJ SOLN
INTRAMUSCULAR | Status: DC | PRN
  Administered 2021-05-14 (×2): 50 ug via INTRAVENOUS

## 2021-05-14 MED ORDER — SOD CITRATE-CITRIC ACID 500-334 MG/5ML PO SOLN
ORAL | Status: AC
  Administered 2021-05-14: 30 mL
  Filled 2021-05-14: qty 30

## 2021-05-14 MED ORDER — FENTANYL CITRATE (PF) 100 MCG/2ML IJ SOLN
INTRAMUSCULAR | Status: DC | PRN
  Administered 2021-05-14: 15 ug via INTRATHECAL

## 2021-05-14 MED ORDER — PROMETHAZINE HCL 25 MG/ML IJ SOLN
6.2500 mg | INTRAMUSCULAR | Status: DC | PRN
Start: 2021-05-14 — End: 2021-05-14

## 2021-05-14 MED ORDER — MENTHOL 3 MG MT LOZG: 1.0000 | LOZENGE | OROMUCOSAL | Status: DC | PRN

## 2021-05-14 MED ORDER — MORPHINE SULFATE (PF) 0.5 MG/ML IJ SOLN
INTRAMUSCULAR | Status: DC | PRN
  Administered 2021-05-14: .15 mg via INTRATHECAL

## 2021-05-14 MED ORDER — PHENYLEPHRINE 40 MCG/ML (10ML) SYRINGE FOR IV PUSH (FOR BLOOD PRESSURE SUPPORT)
PREFILLED_SYRINGE | INTRAVENOUS | Status: AC
  Filled 2021-05-14: qty 20

## 2021-05-14 MED ORDER — FENTANYL CITRATE (PF) 100 MCG/2ML IJ SOLN: 25.0000 ug | INTRAMUSCULAR | Status: DC | PRN

## 2021-05-14 MED ORDER — KETOROLAC TROMETHAMINE 30 MG/ML IJ SOLN
INTRAMUSCULAR | Status: AC
  Filled 2021-05-14: qty 1

## 2021-05-14 MED ORDER — FENTANYL CITRATE (PF) 250 MCG/5ML IJ SOLN
INTRAMUSCULAR | Status: AC
  Filled 2021-05-14: qty 5

## 2021-05-14 MED ORDER — KETOROLAC TROMETHAMINE 30 MG/ML IJ SOLN
30.0000 mg | Freq: Four times a day (QID) | INTRAMUSCULAR | Status: AC | PRN
  Administered 2021-05-14: 30 mg via INTRAVENOUS
  Filled 2021-05-14: qty 1

## 2021-05-14 MED ORDER — KETOROLAC TROMETHAMINE 30 MG/ML IJ SOLN
30.0000 mg | Freq: Once | INTRAMUSCULAR | Status: AC
  Administered 2021-05-14: 30 mg via INTRAVENOUS

## 2021-05-14 MED ORDER — SIMETHICONE 80 MG PO CHEW: 80.0000 mg | CHEWABLE_TABLET | ORAL | Status: DC | PRN

## 2021-05-14 SURGICAL SUPPLY — 46 items
BAG COUNTER SPONGE SURGICOUNT (BAG) ×2 IMPLANT
BARRIER ADHS 3X4 INTERCEED (GAUZE/BANDAGES/DRESSINGS) ×1 IMPLANT
BENZOIN TINCTURE PRP APPL 2/3 (GAUZE/BANDAGES/DRESSINGS) ×1 IMPLANT
CHLORAPREP W/TINT 26 (MISCELLANEOUS) ×2 IMPLANT
CLAMP CORD UMBIL (MISCELLANEOUS) IMPLANT
DERMABOND ADVANCED (GAUZE/BANDAGES/DRESSINGS)
DERMABOND ADVANCED .7 DNX12 (GAUZE/BANDAGES/DRESSINGS) IMPLANT
DRAPE C SECTION CLR SCREEN (DRAPES) ×2 IMPLANT
DRSG OPSITE POSTOP 4X10 (GAUZE/BANDAGES/DRESSINGS) ×2 IMPLANT
DRSG PAD ABDOMINAL 8X10 ST (GAUZE/BANDAGES/DRESSINGS) ×1 IMPLANT
DRSG TELFA 3X8 NADH (GAUZE/BANDAGES/DRESSINGS) ×4 IMPLANT
ELECT REM PT RETURN 9FT ADLT (ELECTROSURGICAL) ×2
ELECTRODE REM PT RTRN 9FT ADLT (ELECTROSURGICAL) ×1 IMPLANT
EXTRACTOR VACUUM M CUP 4 TUBE (SUCTIONS) IMPLANT
GLOVE SURG POLYISO LF SZ6.5 (GLOVE) ×2 IMPLANT
GLOVE SURG UNDER POLY LF SZ7 (GLOVE) ×4 IMPLANT
GOWN STRL REUS W/ TWL LRG LVL3 (GOWN DISPOSABLE) ×2 IMPLANT
GOWN STRL REUS W/TWL LRG LVL3 (GOWN DISPOSABLE) ×2
KIT ABG SYR 3ML LUER SLIP (SYRINGE) IMPLANT
KIT TURNOVER KIT B (KITS) ×2 IMPLANT
NDL HYPO 25X5/8 SAFETYGLIDE (NEEDLE) IMPLANT
NEEDLE HYPO 25X5/8 SAFETYGLIDE (NEEDLE) IMPLANT
NS IRRIG 1000ML POUR BTL (IV SOLUTION) ×2 IMPLANT
PACK C SECTION WH (CUSTOM PROCEDURE TRAY) ×2 IMPLANT
PAD ABD DERMACEA PRESS 5X9 (GAUZE/BANDAGES/DRESSINGS) ×1 IMPLANT
PAD DRESSING TELFA 3X8 NADH (GAUZE/BANDAGES/DRESSINGS) IMPLANT
PAD OB MATERNITY 4.3X12.25 (PERSONAL CARE ITEMS) ×2 IMPLANT
PENCIL SMOKE EVACUATOR (MISCELLANEOUS) ×2 IMPLANT
RTRCTR C-SECT PINK 25CM LRG (MISCELLANEOUS) ×2 IMPLANT
STRIP SURGICAL 1/2 X 6 IN (GAUZE/BANDAGES/DRESSINGS) ×1 IMPLANT
SUT CHROMIC 1 CTX 36 (SUTURE) IMPLANT
SUT CHROMIC 2 0 CT 1 (SUTURE) ×2 IMPLANT
SUT MON AB 4-0 PS1 27 (SUTURE) ×2 IMPLANT
SUT PLAIN 1 NONE 54 (SUTURE) IMPLANT
SUT PLAIN 2 0 (SUTURE)
SUT PLAIN 2 0 XLH (SUTURE) IMPLANT
SUT PLAIN ABS 2-0 CT1 27XMFL (SUTURE) IMPLANT
SUT VIC AB 0 CTX 36 (SUTURE) ×1
SUT VIC AB 0 CTX36XBRD ANBCTRL (SUTURE) ×1 IMPLANT
SUT VIC AB 1 CTX 36 (SUTURE) ×2
SUT VIC AB 1 CTX36XBRD ANBCTRL (SUTURE) ×2 IMPLANT
SUT VIC AB 2-0 CT1 27 (SUTURE) ×1
SUT VIC AB 2-0 CT1 TAPERPNT 27 (SUTURE) IMPLANT
TOWEL GREEN STERILE FF (TOWEL DISPOSABLE) ×2 IMPLANT
TRAY FOLEY W/BAG SLVR 14FR (SET/KITS/TRAYS/PACK) ×2 IMPLANT
WATER STERILE IRR 1000ML POUR (IV SOLUTION) ×2 IMPLANT

## 2021-05-14 NOTE — Progress Notes (Signed)
Morgan Boyd is a 35 y.o. [id number] at 67w5dadmitted for IUGR less than2%, Gestational hypertension  Subjective: Patient denies any complaints.   Objective: BP 132/78 (BP Location: Right Arm)   Pulse 79   Temp 99 F (37.2 C) (Oral)   Resp 17   Ht '5\' 3"'$  (1.6 m)   Wt 102.1 kg   LMP 11/01/2020   SpO2 97%   BMI 39.86 kg/m  I/O last 3 completed shifts: In: -  Out: 600 [Urine:600] No intake/output data recorded.  FHT:  FHR: 140 bpm, variability: absent,  accelerations:  Abscent,  decelerations:  Present Variable UC:   none SVE:    deferred Bedside Ultrasound: Breech presentation.  Labs: Lab Results  Component Value Date   WBC 11.8 (H) 05/11/2021   HGB 12.3 05/11/2021   HCT 36.4 05/11/2021   MCV 87.9 05/11/2021   PLT 251 05/11/2021    Assessment / Plan: IUGR less than 2nd% and now with category III tracing, Breech presentation  - Will proceed with delivery by cesarean section.  I discussed with patient risks, benefits and alternatives of cesarean section including risks of bleeding, infection, damage to organs.  We discussed possible need for a classical C/section and will need to have subsequent cesarean deliveries in the future if needed.  SHe reports her husband will get a vasectomy for birth control. - Plan discussed with MFM Dr. RTama Highwho agrees with the plan. -Patient has received about 4 hours of magnesium sulfate for cerebral palsy prophylaxis.  EArchie Endo MD.  05/14/2021, 7:53 AM

## 2021-05-14 NOTE — Progress Notes (Signed)
Discussed POC with pt. Pt consented to MgSO4 for neuroprotection. Questions answered. Will continue continuous EFM.   [place], [place] 05/14/21 [phone]

## 2021-05-14 NOTE — Lactation Note (Signed)
This note was copied from a baby's chart. Lactation Consultation Note  Patient Name: Morgan Boyd [id number] Date: 05/14/2021   Age:37 hours  Attempted to visit with mom but OB Specialty care Morgan Boyd reported to [facility] that she was getting ready to go to the NICU and to visit her later. Mom has already started pumping, LC brought breastmilk labels to mom's room. Will F/U later this afternoon to do initial assessment.   Maternal Data    Feeding    Lactation Tools Discussed/Used    Interventions    Discharge    Consult Status      Morgan Boyd Morgan Boyd 05/14/2021, 4:13 PM

## 2021-05-14 NOTE — Transfer of Care (Signed)
Immediate Anesthesia Transfer of Care Note  Patient: Morgan Boyd  Procedure(s) Performed: CESAREAN SECTION  Patient Location: PACU  Anesthesia Type:Spinal  Level of Consciousness: awake  Airway & Oxygen Therapy: Patient Spontanous Breathing  Post-op Assessment: Report given to RN and Post -op Vital signs reviewed and stable  Post vital signs: Reviewed and stable  Last Vitals:  Vitals Value Taken Time  BP 139/86 05/14/21 1015  Temp    Pulse 89 05/14/21 1017  Resp 19 05/14/21 1017  SpO2 93 % 05/14/21 1017  Vitals shown include unvalidated device data.  Last Pain:  Vitals:   05/14/21 0700  TempSrc:   PainSc: 1       Patients Stated Pain Goal: 3 ([phone])  Complications: No notable events documented.

## 2021-05-14 NOTE — Anesthesia Preprocedure Evaluation (Addendum)
Anesthesia Evaluation  Patient identified by MRN, date of birth, ID band Patient awake    Reviewed: Allergy & Precautions, NPO status , Patient's Chart, lab work & pertinent test results  History of Anesthesia Complications Negative for: history of anesthetic complications  Airway Mallampati: II  TM Distance: >3 FB Neck ROM: Full    Dental no notable dental hx.    Pulmonary neg pulmonary ROS,    Pulmonary exam normal        Cardiovascular Normal cardiovascular exam     Neuro/Psych negative neurological ROS     GI/Hepatic negative GI ROS, Neg liver ROS,   Endo/Other  Morbid obesity  Renal/GU negative Renal ROS     Musculoskeletal negative musculoskeletal ROS (+)   Abdominal   Peds  Hematology negative hematology ROS (+)   Anesthesia Other Findings   Reproductive/Obstetrics (+) Pregnancy (gHTN, 9w2dgestation, severe IUGR)                            Anesthesia Physical Anesthesia Plan  ASA: 3  Anesthesia Plan: Spinal   Post-op Pain Management:    Induction:   PONV Risk Score and Plan: 4 or greater and Treatment may vary due to age or medical condition, Ondansetron and Dexamethasone  Airway Management Planned: Natural Airway  Additional Equipment: None  Intra-op Plan:   Post-operative Plan:   Informed Consent: I have reviewed the patients History and Physical, chart, labs and discussed the procedure including the risks, benefits and alternatives for the proposed anesthesia with the patient or authorized representative who has indicated his/her understanding and acceptance.       Plan Discussed with: CRNA  Anesthesia Plan Comments: (Urgent C/S for severe IUGR, category 3 tracing. Plan for spinal anesthesia discussed with Dr. KAlesia Richards -Daiva Huge MD)        Anesthesia Quick Evaluation

## 2021-05-14 NOTE — Op Note (Addendum)
Patient: Morgan Stave. Boyd DOB:  May 10, 1986 MRN: [id number]  DATE OF SURGERY: 05/14/2021  PREOP DIAGNOSIS:  19. 35 year old [id number] at 27 week 5 day EGA 2. Intrauterine growth restriction at less than 2% 3. Abnormal umbilical artery dopplers with reversed end diastolic flow  4. Abnormal fetal heart tracing with category III tracing: absent variability and recurrent variable decelerations  5. Uterus with fibroids  6. Frank breech fetal presentation 7. Status post two courses of betamethasone for fetal lung maturity   8. On magnesium sulfate for fetal neuro protection 9. BMI of 39.7 10.Gestational hypertension with severe features (history of some elevated blood pressures in severe range) and on oral labetalol.   POSTOP DIAGNOSIS: Same as above  PROCEDURE: Classical cesarean section via Pfannenstiel incision    SURGEON: Dr.  Johny Sax Arben Packman  ASSISTANT:Vivian Maryla Morrow, CNM   ATTENDING ATTESTATION: I was present and scrubbed for the procedure and the CNM assistant was required due to the complexity of anatomy  ANESTHESIA: Spinal  COMPLICATIONS: None  FINDINGS: Viable female infant in frank breech presentation, direct sacrum posterior.  Weight 1 lb 2oz, Apgar scores of 2, 4 and 5. Uterus with several fibroids: 9 cm fibroid in right posterior uterine cornua, 2 cm intramural anterior fibroid,  1 cm right anterior subserosal fibroid.  Normal fallopian tubes and ovaries bilaterally    EBL:  900 cc  IV FLUID:  see anesthesia notes  URINE OUTPUT: clear urine see anesthesia notes  INDICATIONS:  As in Pre-op diagnosis   PROCEDURE:  Informed consent was obtained from the patient to undergo the procedure. She was taken to the operating room where her spinal anesthesia was found to be adequate. A Traxi retractor was placed on her abdomen.  She was prepped and draped in the usual sterile fashion and a Foley catheter was placed. She received 2 g of IV Ancef preoperatively. A Pfannenstiel incision was  made with the scalpel and the incision extended through the subcutaneous layer and also the fascia with the bovie. Small perforators in the subcutaneous layer were contained with the Bovie. The fascia was nicked in the midline and then was further separated from the rectus muscles bilaterally using Mayo scissors. Kochers were placed inferiorly and then superiorly to allow further separation of fascia from the rectus muscles.  The peritoneal cavity was entered bluntly with the fingers. The Alexis retractor was placed in. The bladder flap was created using Metzenbaum scissors. The lower uterine segment was noted not to be developed therefore a classical uterine incision was made from above the bladder flap and extended about 10 cm upwards with the bandage scissors.  Placenta was encountered anteriorly.  Membranes were then ruptured and moderate clear amniotic fluid was noted. The motions to deliver the breech presenting fetus were performed atraumatically: The sacrum was brought to the incision and delivered. The feet and legs were then delivered.  Gentle traction was applied on the hips to turn the sacrum to anterior position then deliver the trunk.  Baby was rotated to its left side and right arm was delivered.  Baby was then rotated to its right side and the left arm was delivered.  Maxillary pressure was applied and abdominal pressure applied to deliver the head.  She delivered a viable female infant.  The cord was clamped and cut without delay.  Arterial cord pH was collected but venous blood could not be obtained from the cord as it as collapsed.  Cord blood also could not be collected from the  small cord.     The uterus was not exteriorized.  The edges of the uterus was grasped with Allis clamps. The placenta was delivered with gentle traction on the umbilical cord after cleaving it from the uterus.  The uterus was cleared of clots and debris with a lap.  The uterine incision was closed with #1 Vicryl in a  running locked stitch. Two imbricating layers of the same stitch was placed over the initial closure.  A small area that bled on the lower area was contained with figure of 8 stitch.  Irrigation was applied and suctioned out. Excellent hemostasis was noted over the incision.  Interceed was placed over the uterine incision.  The muscles and peritoneum were then reapproximated using chromic suture.  Fascia was closed using 0 Vicryl in a running stitch. The subcutaneous layer was irrigated and suctioned out. Small perforators were contained with the bovie.  The subcutaneous layer was closed using 1-0 plain in interrupted stitches. The skin was closed using 4-0 Monocryl. Benzocaine and steri strips were applied.  Honeycomb and pressure dressings were then applied. The patient was then cleaned and she was taken to the recovery room in stable condition.  Her baby had already been taken to the NICU under CPAP by the NICU team.  Will continue maternal magnesium sulfate for seizure prophylaxis for gestational hypertension with severe features.      SPECIMEN: Placenta to pathology, arterial umbilical cord pH to lab.   DISPOSITION: TO PACU, STABLE.  Dr. Waymon Amato.    05/15/2021.

## 2021-05-14 NOTE — Lactation Note (Signed)
This note was copied from a baby's chart. Lactation Consultation Note  Patient Name: Morgan Boyd [id number] Date: 05/14/2021 Reason for consult: Initial assessment;NICU baby;Preterm <34wks;Infant < 6lbs Age:35 years  Visited with mom of 13 hours old pre-term NICU female, she's a P3 and experienced BF but this is her first baby in NICU. She reported (+) breast changes during the pregnancy.   Reviewed pumping log, settings, benefits of breastmilk for premature babies and lactogenesis II.   Plan of care:  Encouraged mom to pump every 2-3 hours, at least 8 pumping sessions/24 hours She'll add coconut oil, hand expression and breast massage prior pumping  BF brochure, BF resources and NICU booklet were reviewed. No support person in the room at this time. Mom reported all questions and concerns were answered, she's aware of [facility] OP services and will call PRN.  Maternal Data Has patient been taught Hand Expression?: Yes Does the patient have breastfeeding experience prior to this delivery?: Yes How long did the patient breastfeed?: 1st child till 1.y and 2nd till 2 1/2 y.o  Feeding Mother's Current Feeding Choice: Breast Milk  Lactation Tools Discussed/Used Tools: Pump;Flanges;Coconut oil Flange Size: 27 Breast pump type: Double-Electric Breast Pump Pump Education: Setup, frequency, and cleaning;Milk Storage Reason for Pumping: pre-term NICU infant Pumping frequency: q 3 hours (recommended) Pumped volume:  (drops)  Interventions Interventions: Breast feeding basics reviewed;DEBP;Breast massage;Hand express;Coconut oil  Discharge Pump: Advised to call insurance company (She'll call [employer]) [place] Program: No  Consult Status Consult Status: Follow-up Date: 05/15/21 Follow-up type: In-patient    Morgan Boyd 05/14/2021, 7:16 PM

## 2021-05-14 NOTE — Interval H&P Note (Signed)
History and Physical Interval Note:  05/14/2021 7:59 AM  Morgan Boyd  has presented today for surgery, with the diagnosis of Unscheduled Cesarean Section, See Delivery Summary.  The various methods of treatment have been discussed with the patient and family. After consideration of risks, benefits and other options for treatment, the patient has consented to  Procedure(s): CESAREAN SECTION (N/A) as a surgical intervention.  The patient's history has been reviewed, patient examined, no change in status, stable for surgery.  I have reviewed the patient's chart and labs.  Questions were answered to the patient's satisfaction.    Archie Endo, MD.

## 2021-05-14 NOTE — Anesthesia Procedure Notes (Signed)
Spinal  Patient location during procedure: OR Start time: 05/14/2021 8:10 AM End time: 05/14/2021 8:13 AM Reason for block: surgical anesthesia Staffing Performed: anesthesiologist  Anesthesiologist: Brennan Bailey, MD Preanesthetic Checklist Completed: patient identified, IV checked, risks and benefits discussed, monitors and equipment checked, pre-op evaluation and timeout performed Spinal Block Patient position: sitting Prep: DuraPrep and site prepped and draped Patient monitoring: heart rate, continuous pulse ox and blood pressure Approach: midline Location: L3-4 Injection technique: single-shot Needle Needle type: Pencan  Needle gauge: 24 G Needle length: 10 cm Assessment Sensory level: T4 Events: CSF return Additional Notes Risks, benefits, and alternative discussed. Patient gave consent to procedure. Prepped and draped in sitting position. Clear CSF obtained after one needle pass. Positive terminal aspiration. No pain or paraesthesias with injection. Patient tolerated procedure well. Vital signs stable. Tawny Asal, MD

## 2021-05-14 NOTE — H&P (View-Only) (Signed)
Morgan Boyd is a 35 y.o. [id number] at 107w5dadmitted for IUGR less than2%, Gestational hypertension  Subjective: Patient denies any complaints.   Objective: BP 132/78 (BP Location: Right Arm)   Pulse 79   Temp 99 F (37.2 C) (Oral)   Resp 17   Ht '5\' 3"'$  (1.6 m)   Wt 102.1 kg   LMP 11/01/2020   SpO2 97%   BMI 39.86 kg/m  I/O last 3 completed shifts: In: -  Out: 600 [Urine:600] No intake/output data recorded.  FHT:  FHR: 140 bpm, variability: absent,  accelerations:  Abscent,  decelerations:  Present Variable UC:   none SVE:    deferred Bedside Ultrasound: Breech presentation.  Labs: Lab Results  Component Value Date   WBC 11.8 (H) 05/11/2021   HGB 12.3 05/11/2021   HCT 36.4 05/11/2021   MCV 87.9 05/11/2021   PLT 251 05/11/2021    Assessment / Plan: IUGR less than 2nd% and now with category III tracing, Breech presentation  - Will proceed with delivery by cesarean section.  I discussed with patient risks, benefits and alternatives of cesarean section including risks of bleeding, infection, damage to organs.  We discussed possible need for a classical C/section and will need to have subsequent cesarean deliveries in the future if needed.  SHe reports her husband will get a vasectomy for birth control. - Plan discussed with MFM Dr. RTama Highwho agrees with the plan. -Patient has received about 4 hours of magnesium sulfate for cerebral palsy prophylaxis.  EArchie Endo MD.  05/14/2021, 7:53 AM

## 2021-05-14 NOTE — Progress Notes (Signed)
Called to review fetal heart rate tracing. Due to repetitive spontaneous decelerations. Pt had a 2 minute deceleration at 0004 and a 4 minute deceleration at 0228. With return to baseline of 140's. Minimal to moderate variability no accelerations are present. Will start magnesium sulfate for neuroprotection in the event delivery is warranted within the next 12 hours. Continue continues monitoring. Pt to be npo for now. Start LR '@100'$  cc an our. Keep appt for doppers this morning. Will appreciate MFM recommendations.

## 2021-05-14 NOTE — Anesthesia Postprocedure Evaluation (Signed)
Anesthesia Post Note  Patient: Morgan Boyd  Procedure(s) Performed: CESAREAN SECTION     Patient location during evaluation: PACU Anesthesia Type: Spinal Level of consciousness: awake and alert and oriented Pain management: pain level controlled Vital Signs Assessment: post-procedure vital signs reviewed and stable Respiratory status: spontaneous breathing, nonlabored ventilation and respiratory function stable Cardiovascular status: blood pressure returned to baseline Postop Assessment: no apparent nausea or vomiting, spinal receding, no headache and no backache Anesthetic complications: no   No notable events documented.  Last Vitals:  Vitals:   05/14/21 1245 05/14/21 1246  BP:  109/69  Pulse:  91  Resp:    Temp:    SpO2: 95%     Last Pain:  Vitals:   05/14/21 1244  TempSrc: Oral  PainSc:    Pain Goal: Patients Stated Pain Goal: 3 (05/14/21 0700)                 Brennan Bailey

## 2021-05-15 ENCOUNTER — Ambulatory Visit

## 2021-05-15 ENCOUNTER — Encounter (HOSPITAL_COMMUNITY): Payer: Self-pay | Admitting: Obstetrics & Gynecology

## 2021-05-15 LAB — CBC
HCT: 28.5 % — ABNORMAL LOW (ref 36.0–46.0)
Hemoglobin: 9.4 g/dL — ABNORMAL LOW (ref 12.0–15.0)
MCH: 29.7 pg (ref 26.0–34.0)
MCHC: 33 g/dL (ref 30.0–36.0)
MCV: 89.9 fL (ref 80.0–100.0)
Platelets: 172 10*3/uL (ref 150–400)
RBC: 3.17 MIL/uL — ABNORMAL LOW (ref 3.87–5.11)
RDW: 15.4 % (ref 11.5–15.5)
WBC: 13.1 10*3/uL — ABNORMAL HIGH (ref 4.0–10.5)
nRBC: 0 % (ref 0.0–0.2)

## 2021-05-15 MED ORDER — IBUPROFEN 600 MG PO TABS
600.0000 mg | ORAL_TABLET | Freq: Four times a day (QID) | ORAL | Status: DC
  Administered 2021-05-15 – 2021-05-17 (×9): 600 mg via ORAL
  Filled 2021-05-15 (×10): qty 1

## 2021-05-15 NOTE — Progress Notes (Signed)
Subjective: POD# 1 Information for the patient's newborn:  Evonne, Starbird [id number]  female   53 Name [facility] - in NICU  Reports feeling "I haven't processed everything yet" Feeding: breast Reports tolerating PO and denies N/V, foley removed, ambulating and urinating w/o difficulty  Pain controlled with acetaminophen and toradol Denies HA/SOB/dizziness  Flatus negative Vaginal bleeding is normal, no clots     Objective:  VS:  Vitals:   05/15/21 0416 05/15/21 0500 05/15/21 0610 05/15/21 0741  BP:    117/74  Pulse:    88  Resp: '16 15 16 16  '$ Temp: 98 F (36.7 C)   98.9 F (37.2 C)  TempSrc: Axillary   Oral  SpO2: 98%   99%  Weight:      Height:        Intake/Output Summary (Last 24 hours) at 05/15/2021 [id number] Last data filed at 05/15/2021 0700 Gross per 24 hour  Intake 4855.6 ml  Output 4325 ml  Net 530.6 ml     Recent Labs    05/14/21 0749 05/15/21 0551  WBC 9.8 13.1*  HGB 13.4 9.4*  HCT 40.2 28.5*  PLT 228 172    Blood type: --/--/A POS (07/25 0748) Rubella:      Physical Exam:  General: alert, cooperative, and no distress CV: Regular rate and rhythm Resp: clear Abdomen: soft, nontender, normal bowel sounds Incision: clean, dry, and intact Perineum:  Uterine Fundus: firm, below umbilicus, nontender Lochia: minimal Ext: extremities normal, atraumatic, no cyanosis or edema   Assessment/Plan: 35 y.o.   POD# 1. [id number] primary cesarean for severe IUGR @ 27.5wks                 Principal Problem:   Delivery by classical cesarean section Active Problems:   IUGR, antenatal   Gestational hypertension   Fibroids   Vitamin D deficiency   AMA (advanced maternal age) multigravida 35+  D/C MgSO4 today at 11am Continue Labetalol '100mg'$  BID Routine post-op PP care          Advance diet as tolerated Advised warm fluids and ambulation to improve GI motility after magnesium d/c Encourage rest  Breastfeeding support Dr Alwyn Pea aware of pt status and  [place], MSN, CNM 05/15/2021, 8:33 AM

## 2021-05-15 NOTE — Lactation Note (Signed)
This note was copied from a baby's chart. Lactation Consultation Note  Patient Name: Morgan Boyd [id number] Date: 05/15/2021 Reason for consult: Follow-up assessment;NICU baby;Preterm <34wks Age:37 hours  I followed up with Ms. Morgan Boyd and reinforced education on pumping. We also discussed how to obtain a personal pump, and she states that her insurance will cover a 6 month hospital grade pump rental.  I answered questions about the process for obtaining a DEBP via the gift shop, and I verified that the gift shop had pumps in stock. I also provided her with a belly band to serve as a make-shift pumping "bra."  Plan: Pump both breasts every three hours for 15-20 minutes.  Feeding Mother's Current Feeding Choice: Breast Milk  Lactation Tools Discussed/Used Breast pump type: Double-Electric Breast Pump Pump Education: Setup, frequency, and cleaning Reason for Pumping: NICU; pre-term Pumping frequency: q 3 hours (recommended) Pumped volume:  (droplets)  Interventions Interventions: Breast feeding basics reviewed;Education  Discharge Pump: Advised to call insurance company  Consult Status Consult Status: Follow-up Follow-up type: In-patient    Morgan Boyd 05/15/2021, 2:57 PM

## 2021-05-16 LAB — SURGICAL PATHOLOGY

## 2021-05-16 NOTE — Progress Notes (Signed)
Subjective: POD# 2 Information for the patient's newborn:  Morgan, Schmeichel Boyd  female   82 Name: Morgan Boyd In NICU  Reports feeling "better today since off that magnesium" Feeding: breast Reports tolerating PO and denies N/V, foley removed, ambulating and urinating w/o difficulty  Pain controlled with acetaminophen and ibuprofen (OTC) Denies HA/SOB/dizziness  Flatus passing Vaginal bleeding is normal, no clots     Objective:  VS:  Vitals:   05/15/21 2309 05/16/21 0314 05/16/21 0746 05/16/21 0938  BP: 125/69 (!) 105/53 101/60 139/81  Pulse: (!) 101 100 80 96  Resp: '18 16 16   '$ Temp: 99.4 F (37.4 C) 98.9 F (37.2 C) 99.4 F (37.4 C)   TempSrc: Oral Oral Oral   SpO2: 100% 98% 96%   Weight:      Height:        Intake/Output Summary (Last 24 hours) at 05/16/2021 1142 Last data filed at 05/16/2021 0700 Gross per 24 hour  Intake 970 ml  Output 2100 ml  Net -1130 ml     Recent Labs    05/14/21 0749 05/15/21 0551  WBC 9.8 13.1*  HGB 13.4 9.4*  HCT 40.2 28.5*  PLT 228 172    Blood type: --/--/A POS (07/25 0748) Rubella:      Physical Exam:  General: alert, cooperative, and no distress CV: Regular rate and rhythm Resp: clear Abdomen: soft, nontender, normal bowel sounds Incision: clean, dry, and intact Perineum:  Uterine Fundus: firm, below umbilicus, nontender Lochia: minimal Ext: Homans sign is negative, no sign of DVT   Assessment/Plan: 35 y.o.   POD# 2. [id number]                  Principal Problem:   Delivery by classical cesarean section Active Problems:   IUGR, antenatal   Gestational hypertension   Fibroids   Vitamin D deficiency   AMA (advanced maternal age) multigravida 35+   Routine post-op PP care          Advance diet as tolerated Advised warm fluids and ambulation to improve GI motility Labetalol '100mg'$  BID - hold if B/P<120/70 Encourage rest when baby rests Breastfeeding support Anticipate D/C 05/17/21 Dr Mancel Bale aware of pt  status and [place], MSN, CNM 05/16/2021, 11:42 AM

## 2021-05-16 NOTE — Clinical Social Work Maternal (Signed)
CLINICAL SOCIAL WORK MATERNAL/CHILD NOTE  Patient Details  Name: Morgan Boyd MRN: [id number] Date of Birth: 1985/11/27  Date:  05/16/2021  Clinical Social Worker Initiating Note:  Laurey Arrow Date/Time: Initiated:  05/16/21/1134     Child's Name:  Gretel Acre   Biological Parents:  Mother, Father   Need for Interpreter:  None   Reason for Referral:  Parental Support of Premature Babies < 32 weeks/or Critically Ill babies   Address:  [place], [place] [address]    Phone number:  [phone] (home) [phone] (work)    Additional phone number: FOB's number is [phone]  Household Members/Support Persons (HM/SP):   Household Member/Support Person 1, Household Member/Support Person 2, Household Member/Support Person 3   HM/SP Name Relationship DOB or Age  HM/SP -1 Donte Pryce Sr FOB/ Husband 6//17/1982  HM/SP -2 Kaycie Pegues daughter 10/26/2010  HM/SP -3 Donte Marifer Hurd son 02/14/2014  HM/SP -4        HM/SP -5        HM/SP -6        HM/SP -7        HM/SP -8          Natural Supports (not living in the home):  Immediate Family, Extended Family, Parent, Friends (MOB reported that they don't have any local family but their family will be a support from a distance.)   Professional Supports: None   Employment: Unemployed   Type of Work:     Education:  Production designer, theatre/television/film   Homebound arranged:    Pensions consultant:  Multimedia programmer    Other Resources:   (CSW provided MOB information to apply for [employer], [employer], and SSI benefits for infant.)   Cultural/Religious Considerations Which May Impact Care:  Per [employer], MOB is Engineer, manufacturing.   Strengths:  Ability to meet basic needs  , Pediatrician chosen   Psychotropic Medications:         Pediatrician:    [place] area  Pediatrician List:   [facility]      Pediatrician Fax Number:    Risk Factors/Current Problems:  None   Cognitive State:  Alert  , Able to Concentrate  , Insightful  , Linear Thinking  , Goal Oriented     Mood/Affect:  Comfortable  , Interested  , Calm  , Relaxed     CSW Assessment: CSW met with MOB in room 101 to complete a clinical assessment for NCIU admission. When CSW arrived, MOB was pumping.  CSW offered to return at a later time and MOB declined. CSW explained CSW's role and MOB was receptive to meeting with CSW.  MOB was polite, easy to engage, and asked appropriate questions.   CSW asked about MOB's thoughts and feelings about infant's NICU admission and MOB reported, "It has been like a world wind."  MOB shared "Things happened so fast and we just was not prepared."  CSW validated and normalized MOB's thoughts and feelings and discussed other emotions that MOB may experience during the postpartum period.    CSW provided education regarding the baby blues period vs. perinatal mood disorders, discussed treatment and gave resources for mental health follow up if concerns arise.  CSW recommends self-evaluation during the postpartum time period and encouraged MOB to contact a medical professional if symptoms are noted  at any time.  MOB presented with insight and awareness and did not demonstrate any acute MH symtoms. MOB assured CSW that she feels comfortable seeking help if needed. CSW assessed for safety and MOB denied SI, HI, and DV.   MOB shared not having any essential items for infant at this time however, reported being able to obtain items prior to infant's discharge. MOB is aware that she can contact CSW if the family is unable to obtain essential items.   MOB denied barriers to visiting with infant post MOB's discharge.  CSW reviewed infant's eligibility for SSI benefits and MOB expressed interest in applying.  CSW explained application process and encouraged MOB to follow-up with CSW  if any questions or concerns arise; MOB agreed.   CSW will continue to offer resources and supports to family while infant remains in NICU.    CSW Plan/Description:  Psychosocial Support and Ongoing Assessment of Needs, Perinatal Mood and Anxiety Disorder (PMADs) Education, Other Patient/Family Education, Other Information/Referral to [employer], MSW, [employer] Social Work [phone]   Dimple Nanas, LCSW 05/16/2021, 11:43 AM

## 2021-05-17 DIAGNOSIS — D62 Acute posthemorrhagic anemia: Secondary | ICD-10-CM | POA: Diagnosis not present

## 2021-05-17 DIAGNOSIS — O1490 Unspecified pre-eclampsia, unspecified trimester: Secondary | ICD-10-CM | POA: Diagnosis not present

## 2021-05-17 MED ORDER — NIFEDIPINE ER 30 MG PO TB24: 30.0000 mg | ORAL_TABLET | Freq: Every day | ORAL | 1 refills | Status: DC

## 2021-05-17 MED ORDER — SODIUM CHLORIDE 0.9% FLUSH: 3.0000 mL | Freq: Two times a day (BID) | INTRAVENOUS | Status: DC

## 2021-05-17 MED ORDER — NIFEDIPINE ER OSMOTIC RELEASE 30 MG PO TB24
30.0000 mg | ORAL_TABLET | Freq: Every day | ORAL | Status: DC
  Administered 2021-05-17: 30 mg via ORAL
  Filled 2021-05-17: qty 1

## 2021-05-17 MED ORDER — IBUPROFEN 600 MG PO TABS: 600.0000 mg | ORAL_TABLET | Freq: Four times a day (QID) | ORAL | 0 refills | Status: DC

## 2021-05-17 MED ORDER — OXYCODONE HCL 5 MG PO TABS: 5.0000 mg | ORAL_TABLET | ORAL | 0 refills | Status: DC | PRN

## 2021-05-17 MED ORDER — POLYSACCHARIDE IRON COMPLEX 150 MG PO CAPS: 150.0000 mg | ORAL_CAPSULE | Freq: Every day | ORAL | 2 refills | Status: DC

## 2021-05-17 NOTE — Lactation Note (Signed)
This note was copied from a baby's chart. Lactation Consultation Note  Patient Name: Morgan Boyd [id number] Date: 05/17/2021 Reason for consult: Follow-up assessment;NICU baby;Infant < 6lbs;Preterm <34wks;Other (Comment) (Stork pump) Age:37 hours  Mom left a message on NICU LC phone line asking about a Stork Pump. LC completed paperwork and faxed it to [place] [facility]. Mom is expecting to be discharged today, reviewed engorgement and sore nipples prevention/treatment.  Will F/U with her again once she's discharged, she's been pumping every 3 hours so far.  Plan of care:  Encouraged mom to pump every 2-3 hours, at least 8 pumping sessions/24 hours She'll call NICU LC in case she hasn't received her Stork pump by tomorrow.   No other support person in her room at this time. Mom reported all questions and concerns were answered, she's aware of NICU LC services and will call PRN.  Maternal Data    Feeding Mother's Current Feeding Choice: Breast Milk   Lactation Tools Discussed/Used    Interventions Interventions: DEBP;Coconut oil;Breast feeding basics reviewed;Education  Discharge Discharge Education: Engorgement and breast care Pump: Stork Pump  Consult Status Consult Status: Follow-up Follow-up type: In-patient    Morgan Boyd Francene Boyers 05/17/2021, 1:04 PM

## 2021-05-17 NOTE — Discharge Summary (Signed)
PCS OB Discharge Summary     Patient Name: Morgan Boyd DOB: 06/06/1986 MRN: [id number]  Date of admission: 04/20/2021 Delivering MD: Waymon Amato  Date of delivery: 05/14/2021 Type of delivery: PCS  Newborn Data: Sex: Baby female "Milo" Circumcision: Desires in NICU Live born female  Birth Weight: 1 lb 2.7 oz (530 g) APGAR: 2, 4  Newborn Delivery   Birth date/time: 05/14/2021 08:51:00 Delivery type: C-Section, Low Transverse Trial of labor: No C-section categorization: Primary      Feeding: unknown Infant being discharge to home with mother in stable condition.   Admitting diagnosis: IUGR (intrauterine growth restriction) affecting care of mother [O36.5990] Intrauterine pregnancy: [redacted]w[redacted]d    Secondary diagnosis:  Principal Problem:   Delivery by classical cesarean section Active Problems:   IUGR, antenatal   Gestational hypertension   Fibroids   Vitamin D deficiency   AMA (advanced maternal age) multigravida 35+   Acute blood loss anemia   Preeclampsia   Normal postpartum course                                Complications: None                                                              Intrapartum Procedures: cesarean: classical Postpartum Procedures:  24 hour PP mag Complications-Operative and Postpartum: none Augmentation: AROM and at delivery   History of Present Illness: Ms. Morgan HAIDis a 35y.o. female, [id number] who presents at 234w5deeks gestation. [facility]  Her pregnancy has been complicated by:  Patient Active Problem List   Diagnosis Date Noted   Acute blood loss anemia 05/17/2021   Preeclampsia 05/17/2021   Normal postpartum course 05/17/2021   Delivery by classical cesarean section 05/14/2021   Gestational hypertension 04/23/2021   Fibroids 04/23/2021   Vitamin D deficiency 04/23/2021   IUGR, antenatal 04/20/2021   AMA (advanced maternal age) multigravida 35+  04/20/2021     Active Ambulatory Problems    Diagnosis Date Noted   No Active Ambulatory Problems   Resolved Ambulatory Problems    Diagnosis Date Noted   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 yo [id number] 2760w5ds admitted to the hospital 04/20/2021 for scheduled cesarean section with the following indication:Non-Reassuring FHR.Delivery details are as follows:  Membrane Rupture Time/Date: 8:50 AM ,05/14/2021   Delivery Method:C-Section, Low Transverse  Details of operation can be found in separate operative note.  Patient had an uncomplicated postpartum course.  She is ambulating, tolerating a regular diet, passing flatus, and urinating well. Patient is discharged home in stable condition on  05/17/21        Newborn Data: Birth date:05/14/2021  Birth time:8:51 AM  Gender:Female  Living status:Living  Apgars:2 ,4  Weight:530 g     Hospital Course--Scheduled Cesarean: LOS# 27,73urrently POD#3   Severe IUGR: Admitted 7/1 @ 25w16w4dh Severe IUGR <1% ,  with echogenic bowel, GHTN, H/O fibroid., s/p 24 hours magnesium on 7/1, NST reactive for gestational age at that point, Repeat US/Doppler today [id number]: Umbilical artery Doppler showed persistent reversed end-diastolic flow. Ductus venosus study showed no absent or reversed "a" wave, with oligohydramnios. 7/12 Doppler study show intermittent reverse end diastolic flow with ductus venosus continues to show a normal A wave, indicating that a fetal demise is not imminent, breech, anterior placenta, AFI subjectively low 7/12 with MVP 4.3, 7/1, EFW 1.1lbs <1%   BMZ completed 7/1-7/2.-7/17-7/18. Plan for PCS if delivery needed, also plan form  magnesium for neuroprotectant. NICU consult complete. She was taken to the operating room, where Dr. Alesia Richards  performed a primary LTCS under  spinal anesthesia on 7/25 for repetitive fetal decel noted, with delivery of a viable baby female whom was sent to NICU for prematurity, with weight and Apgars as listed above. The patient was taken to recovery in good condition.  Patient planned to pump feed.  On post-op day 1, patient was doing well, tolerating a regular diet, with Hgb of 13.4-9.4 with EBL of 968ms.  Throughout her stay, her physical exam was WNL, her incision was CDI, and her vital signs remained stable.  By post-op day 1, she was up ad lib, tolerating a regular diet, with good pain control with po med.  She was deemed to have received the full benefit of her hospital stay, and was discharged home in stable condition.  Contraceptive choice was undecided.     GHTN turn dx preE with SF for x1 BP 162/87 on 7/27: Pt stable, asymptomatic, BP 142/90, started on labetalol '100mg'$  BID PO on 7/10, required on 7/6 x1 dose IV labetalol, PCR 0.17 upon admission then 0.10 and PCR 0.11 on 7/9, and undetected on 7/12. 0.11 on 7/22 Unremarkable other labs, last drawn on 7/12. Had mild HA last 7/10 that resolved.  S/P 24 hours PP mag due to severe range BP, on 7/28 BP 146/88, labetalol was discontinued and procardia '30mg'$  XL started due to elevated BP, denies HA, RUQ pain or vision changes. Lab remain stable, pt to be discharge hom on procardia and f/u in 1 week with COOB for BP check. BP currently 138/85 stable after taking first dose of procardia, pt stable to be discharged to home   Vit D Deficiency: Stable, asymptomatic, on 1000IUn vit D, increased to 4000IU recommended during pregnancy. Vit D level on  7/4 was 34.19. May continue to take at home, 2000IU daily.    Echogenic Bowel: -CMV, -Tox, - CF carrier screen. Peds to follow now PP.     Physical exam  Vitals:   05/17/21 0955 05/17/21 0956 05/17/21 0957 05/17/21 1243  BP:  (!) 146/88 (!) 146/88 (!) 155/90  Pulse:  92  76  Resp:    18  Temp:    99.1 F (37.3 C)  TempSrc:    Oral  SpO2: 99%    100%  Weight:      Height:       General: alert, cooperative, and no distress Lochia: appropriate Uterine Fundus: firm Incision: Healing well with no significant drainage, No significant erythema, Dressing is clean, dry, and intact, honeycomb dressing CDI Perineum: intact DVT Evaluation: No evidence of DVT seen on physical exam. Negative Homan's sign. No cords or calf tenderness. No significant calf/ankle edema.  Labs: Lab Results  Component Value Date   WBC 13.1 (H) 05/15/2021   HGB 9.4 (L) 05/15/2021   HCT 28.5 (L) 05/15/2021  MCV 89.9 05/15/2021   PLT 172 05/15/2021   CMP Latest Ref Rng & Units 05/11/2021  Glucose 70 - 99 mg/dL 91  BUN 6 - 20 mg/dL 7  Creatinine 0.44 - 1.00 mg/dL 0.59  Sodium 135 - 145 mmol/L 136  Potassium 3.5 - 5.1 mmol/L 4.2  Chloride 98 - 111 mmol/L 108  CO2 22 - 32 mmol/L 20(L)  Calcium 8.9 - 10.3 mg/dL 8.7(L)  Total Protein 6.5 - 8.1 g/dL 6.0(L)  Total Bilirubin 0.3 - 1.2 mg/dL 0.4  Alkaline Phos 38 - 126 U/L 238(H)  AST 15 - 41 U/L 26  ALT 0 - 44 U/L 30    Date of discharge: 05/17/2021 Discharge Diagnoses: Preelampsia, preterm PCS for NRFHT Discharge instruction: per After Visit Summary and "Baby and Me Booklet".  After visit meds:   Activity:           unrestricted and pelvic rest Advance as tolerated. Pelvic rest for 6 weeks.  Diet:                routine Medications: PNV, Ibuprofen, Colace, Iron, and oxy IR, procardia 30 mg xl.  Postpartum contraception: Undecided Condition:  Pt discharge to home with baby in stable and condition  PREE with SF: Continue procardia 30 xl daily, f/u 1 week BP check.  Anemia: PO Iron  Meds: Allergies as of 05/17/2021       Reactions   Latex Itching, Rash   Sodium Bicarbonate Dermatitis, Itching, Rash   Sulfa Antibiotics Palpitations   Faint, dizzy, shaking        Medication List     TAKE these medications    albuterol 108 (90 Base) MCG/ACT inhaler Commonly known as: VENTOLIN HFA Inhale 2  puffs into the lungs every 6 (six) hours as needed for wheezing or shortness of breath.   cetirizine 10 MG tablet Commonly known as: ZYRTEC Take 10 mg by mouth daily as needed for allergies or rhinitis.   ibuprofen 600 MG tablet Commonly known as: ADVIL Take 1 tablet (600 mg total) by mouth every 6 (six) hours.   iron polysaccharides 150 MG capsule Commonly known as: NIFEREX Take 1 capsule (150 mg total) by mouth daily.   NIFEdipine 30 MG 24 hr tablet Commonly known as: ADALAT CC Take 1 tablet (30 mg total) by mouth daily. Start taking on: May 18, 2021   omalizumab 150 MG injection Commonly known as: XOLAIR Inject 300 mg into the skin every 28 (twenty-eight) days.   oxyCODONE 5 MG immediate release tablet Commonly known as: Oxy IR/ROXICODONE Take 1 tablet (5 mg total) by mouth every 4 (four) hours as needed for moderate pain.   PRENATAL VITAMINS PO Take 1 tablet by mouth daily.               Durable Medical Equipment  (From admission, onward)           Start     Ordered   05/17/21 1114  For home use only DME double electric breast pump  Once        05/17/21 1114              Discharge Care Instructions  (From admission, onward)           Start     Ordered   05/17/21 0000  Discharge wound care:       Comments: Take dressing off on day 5-7 postpartum.  Report increased drainage, redness or warmth. Clean with water, let soap trickle down  body. Can leave steri strips on until they fall off or take them off gently at day 10. Keep open to air, clean and dry.   05/17/21 1538            Discharge Follow Up:   Follow-up Information     Waymon Amato, MD. Schedule an appointment as soon as possible for a visit in 6 week(s).   Specialty: Obstetrics and Gynecology Why: For 6 week postpartum follow up. Contact information: [place] [address] [phone]         [facility]. Schedule an  appointment as soon as possible for a visit in 1 week(s).   Specialty: Obstetrics and Gynecology Why: 1 week BP check and 6 weeks Postpartum visit Contact information: [address] [place] [phone] [place], NP-C, CNM 05/17/2021, 3:40 PM  Noralyn Pick, [place]

## 2021-05-18 ENCOUNTER — Ambulatory Visit

## 2021-05-20 ENCOUNTER — Ambulatory Visit: Payer: Self-pay

## 2021-05-20 NOTE — Lactation Note (Signed)
This note was copied from a baby's chart. Lactation Consultation Note  Patient Name: Morgan Boyd [id number] Date: 05/20/2021 Reason for consult: Follow-up assessment;NICU baby;Infant < 6lbs;Preterm <34wks Age:37 days  Visited with mom of 63 days old pre-term NICU female, she's been pumping consistently and having a copious amounts of breastmilk; praised her for her efforts.  Mom complained about some soreness, resized her flanges to # 30, will F/U with her next week to see how it's working out Vs. The # 27 she's been using, her nipples have gotten bigger since the onset of lactogenesis II.  Provided mom with extra bottles for milk storage. Reviewed pumping schedule, lactogenesis II, III and oral care for baby.   Plan of care:   Encouraged mom to continue pumping every 2-3 hours, at least 8 pumping sessions/24 hours Parents will check with NICU RN if they can start doing that again; he was put back on NPO today.   FOB present. Parents reported all questions and concerns were answered, they're both aware of NICU LC services and will call PRN  Maternal Data   Mom's supply is ANL  Feeding Mother's Current Feeding Choice: Breast Milk  Lactation Tools Discussed/Used Tools: Flanges;Coconut oil;Pump Flange Size: 30;27 Breast pump type: Double-Electric Breast Pump Pump Education: Setup, frequency, and cleaning;Milk Storage Reason for Pumping: pre-term NICU infant Pumping frequency: 10 times/24 hours Pumped volume: 160 mL  Interventions Interventions: Breast feeding basics reviewed;DEBP;Education  Discharge Pump: DEBP;Stork Pump  Consult Status Consult Status: Follow-up Follow-up type: In-patient    Morgan Boyd 05/20/2021, 7:17 PM

## 2021-05-26 ENCOUNTER — Telehealth (HOSPITAL_COMMUNITY): Payer: Self-pay

## 2021-05-26 NOTE — Telephone Encounter (Signed)
"  I'm doing ok. My BP has been an issue. They adjusted my meds and I went back to the doctor on Thursday and my BP was normal. My pain is good. I'm not taking any pain medications. I can't see my incision. The dressing is off." RN reviewed incision care and signs of infection. RN suggests that she have partner help her look at incision daily just to make sure no signs of infection are present. Patient declines any other questions or concerns about her healing.  "He is in the NICU. He is considered critically ill. He is just so small and early. His condition is up and down. It has been an emotional roller coaster for me and my husband. We are just taking it day by day." RN listened and provided support.  EPDS score is 11. Will notify patients OB via fax. Reviewed Maternal Mental Resources with patient and will e-mail them to patient.  Sharyn Lull [facility] 05/26/2021,1205

## 2021-06-01 ENCOUNTER — Ambulatory Visit: Payer: Self-pay

## 2021-06-01 NOTE — Lactation Note (Signed)
This note was copied from a baby's chart. Lactation Consultation Note Mother continues to pump frequently with abundant supply. We discussed milk storage and slowly bringing supply to wnl. LC provided information on [facility]. Mother may consider donating.  Patient Name: Morgan Boyd [id number] Date: 06/01/2021 Reason for consult: Follow-up assessment (phone consult) Age:37 wk.o.  Maternal Data  Pumping frequency: >167m 8-10 x day  Feeding Mother's Current Feeding Choice: Breast Milk  Interventions Interventions: Education  Consult Status Consult Status: Follow-up [place] type: [place] IB[facility] 06/01/2021, 6:27 PM

## 2021-06-27 ENCOUNTER — Other Ambulatory Visit: Payer: Self-pay | Admitting: Obstetrics & Gynecology

## 2021-06-27 DIAGNOSIS — K429 Umbilical hernia without obstruction or gangrene: Secondary | ICD-10-CM

## 2021-07-12 ENCOUNTER — Ambulatory Visit (INDEPENDENT_AMBULATORY_CARE_PROVIDER_SITE_OTHER): Admitting: *Deleted

## 2021-07-12 ENCOUNTER — Other Ambulatory Visit: Payer: Self-pay

## 2021-07-12 DIAGNOSIS — L501 Idiopathic urticaria: Secondary | ICD-10-CM

## 2021-07-12 DIAGNOSIS — L508 Other urticaria: Secondary | ICD-10-CM

## 2021-07-16 ENCOUNTER — Ambulatory Visit
Admission: RE | Admit: 2021-07-16 | Discharge: 2021-07-16 | Disposition: A | Discharge status: Home / Self Care | Source: Ambulatory Visit | Attending: Obstetrics & Gynecology | Admitting: Obstetrics & Gynecology

## 2021-07-16 DIAGNOSIS — K429 Umbilical hernia without obstruction or gangrene: Secondary | ICD-10-CM

## 2021-07-16 MED ORDER — IOPAMIDOL (ISOVUE-300) INJECTION 61%
100.0000 mL | Freq: Once | INTRAVENOUS | Status: AC | PRN
  Administered 2021-07-16: 100 mL via INTRAVENOUS

## 2021-08-02 ENCOUNTER — Telehealth: Payer: Self-pay

## 2021-08-02 NOTE — Telephone Encounter (Signed)
NOTES SCANNED TO REFERRAL 

## 2021-08-09 ENCOUNTER — Ambulatory Visit (INDEPENDENT_AMBULATORY_CARE_PROVIDER_SITE_OTHER): Admitting: *Deleted

## 2021-08-09 ENCOUNTER — Other Ambulatory Visit: Payer: Self-pay

## 2021-08-09 DIAGNOSIS — L501 Idiopathic urticaria: Secondary | ICD-10-CM

## 2021-08-09 DIAGNOSIS — L508 Other urticaria: Secondary | ICD-10-CM

## 2021-09-04 ENCOUNTER — Other Ambulatory Visit: Payer: Self-pay

## 2021-09-04 ENCOUNTER — Encounter: Payer: Self-pay | Admitting: Registered"

## 2021-09-04 ENCOUNTER — Encounter: Attending: Obstetrics & Gynecology | Admitting: Registered"

## 2021-09-04 DIAGNOSIS — E669 Obesity, unspecified: Secondary | ICD-10-CM | POA: Insufficient documentation

## 2021-09-04 NOTE — Patient Instructions (Addendum)
-   Aim to walk daily while in car rider line for at least 15 min.  - Aim to increase non-starchy vegetable intake. Have vegetables with pizza night.   - Aim to have breakfast daily.  Can be smoothie while driving or protein shake + fruit.   - Pack snack bag during the week to have while commuting.

## 2021-09-04 NOTE — Progress Notes (Signed)
  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 States she wants to try improving situation through diet and exercise before resulting to need of surgery. Reports she will also be having physical therapy to help as well.  States she is vegetarian and has been eating this way for the past 7 years. States she is the only vegetarian in the home and has to adapt meals sometimes. States she cooks meals within 30 minutes, Monday through Thursday. Reports every Friday is family pizza night (cheese pizza and cheese bread) and weekends are leftovers and eating out. States she has limited time to cook meals within the last 3 months because of commuting to [place] while youngest son is in NICU. States physical activity level has decreased as well. States she was participating in a step class for 60 minutes, 2x/week. States quality of sleep has decreased because it is uncomfortable for her to sleep on her stomach.   States she is married with 3 children (ages 81, 78, and 3 months). Reports she started therapy and has her upcoming appointment in a few weeks. Attends weekly virtual support group for NICU mom's.    Clinical Medical Hx: none Medications: See list Labs: none Notable Signs/Symptoms: increased flatulence, physical discomfort   Lifestyle & Dietary Hx  Estimated daily fluid intake: 75 oz Supplements: See list Sleep: averages 4.5 hrs/night Stress / self-care: weekly bath, step class, church Current average weekly physical activity: none reported   24-Hr Dietary Recall First Meal: skipped Snack:  Second Meal (12:30 pm):  [employer] -Impossible breakfast sandwich + coffee Snack:  Third Meal (5-6:30 pm): quesadilla (Impossible meat, corn, black beans, cheese) Snack:  Beverages: water with Mio (75 oz)   NUTRITION DIAGNOSIS  NB-1.1 Food and nutrition-related knowledge deficit As related to skipping meals.  As evidenced by dietary recall.   NUTRITION INTERVENTION  Nutrition education (E-1) on the following topics: Pt was educated and counseled on the benefits of eating throughout the day, metabolism, eating to fuel the body, and breakfast ideas. Pt agreed with goals listed.  Handouts Provided Include  none  Learning Style & Readiness for Change Teaching method utilized: Visual & Auditory  Demonstrated degree of understanding via: Teach Back  Barriers to learning/adherence to lifestyle change: none identified  Goals Established by Pt Aim to walk daily while in car rider line for at least 15 min. Aim to increase non-starchy vegetable intake. Have vegetables with pizza night.  Aim to have breakfast daily.  Can be smoothie while driving or protein shake + fruit.  Pack snack bag during the week to have while commuting.    MONITORING & EVALUATION Dietary intake, weekly physical activity.  Next Steps  Patient is to follow-up prn.

## 2021-09-06 ENCOUNTER — Ambulatory Visit (INDEPENDENT_AMBULATORY_CARE_PROVIDER_SITE_OTHER): Admitting: *Deleted

## 2021-09-06 ENCOUNTER — Other Ambulatory Visit: Payer: Self-pay

## 2021-09-06 DIAGNOSIS — L508 Other urticaria: Secondary | ICD-10-CM

## 2021-09-11 NOTE — Progress Notes (Signed)
Cardiology Office Note:   Date:  09/12/2021  NAME:  Morgan Boyd    MRN: [id number] DOB:  1985-11-23   PCP:  Andria Frames, PA-C  Cardiologist:  None  Electrophysiologist:  None   Referring MD: Donnel Saxon, CNM   Chief Complaint  Patient presents with   Palpitations   History of Present Illness:   Morgan Boyd is a 35 y.o. female with a hx of HTN in pregnancy who is being seen today for the evaluation of palpitations at the request of Donnel Saxon, [place].  She presents with evaluation of palpitations for the last 6 months.  She had a C-section in July.  Her son was born prematurely at 65 weeks.  There apparently was an issue with the blood flow to the baby.  He has been in the NICU since that time.  She reports symptoms of palpitations.  She describes a fluttering in her chest.  It can occur anytime.  No identifiable trigger.  No alleviating factor.  Symptoms can occur 2-3 times per week.  They were occurring daily but now have lessened.  She reports she has not had any symptoms in the last 2 weeks.  She reports she is significantly stressed.  I do understand.  Her son has been in the NICU since July.  She informs me that there is no end in sight to his current care.  She is hopeful that he will return home at some point.  I do sympathize with her.  She reports no excess caffeine consumption.  She has noticed some association with alcohol but not recently.  She does not smoke.  No drug use is reported.  She is a Pharmacist, hospital.  Her husband also is a Pharmacist, hospital.  She is currently out of work given all of the circumstances with her son.  No recent thyroid studies.  She was anemic in the hospital and this has not been rechecked.  Her EKG demonstrates normal sinus rhythm with nonspecific ST-T changes which are likely obesity related.  No structured exercise.  She reports her diet could improve.  No excess caffeinated beverages.  There is no strong family history of heart disease.  Her aunt had a  stroke.  She denies any chest pain or shortness of breath with her current level of activity.  She reports poor sleep given her son being in the NICU.  Her son is at [facility].  She is driving there every day.  Overall symptoms are still occurring but have not occurred in the past 2 weeks.  We discussed waiting for them to recur if they do.  She thinks this is reasonable.  No concerns for sleep apnea per her report.  Past Medical History: Past Medical History:  Diagnosis Date   Allergy    Bilateral ovarian cysts    Bronchitis    Fibroid    Gestational hypertension    Vitamin D deficiency     Past Surgical History: Past Surgical History:  Procedure Laterality Date   CESAREAN SECTION N/A 05/14/2021   Procedure: CESAREAN SECTION;  Surgeon: Waymon Amato, MD;  Location: [facility];  Service: Obstetrics;  Laterality: N/A;   DILATION AND CURETTAGE OF UTERUS  2010   TONSILLECTOMY      Current Medications: Current Meds  Medication Sig   albuterol (VENTOLIN HFA) 108 (90 Base) MCG/ACT inhaler Inhale 2 puffs into the lungs every 6 (six) hours as needed for wheezing or shortness of breath.   cetirizine (ZYRTEC) 10 MG  tablet Take 10 mg by mouth daily as needed for allergies or rhinitis.   CHOLINE BITARTRATE PO Take 500 mg by mouth.   Drospirenone (SLYND PO) Take by mouth.   omalizumab (XOLAIR) 150 MG injection Inject 300 mg into the skin every 28 (twenty-eight) days.   Prenatal Vit-Fe Fumarate-FA (PRENATAL VITAMINS PO) Take 1 tablet by mouth daily.   [DISCONTINUED] Drospirenone (SLYND PO) Take by mouth.   Current Facility-Administered Medications for the 09/12/21 encounter (Office Visit) with Geralynn Rile, MD  Medication   omalizumab Arvid Right) injection 300 mg     Allergies:    Latex, Sodium bicarbonate, and Sulfa antibiotics   Social History: Social History   Socioeconomic History   Marital status: Married    Spouse name: Not on file   Number of children: 3   Years of education: Not on  file   Highest education level: Not on file  Occupational History   Occupation: Environmental consultant  Tobacco Use   Smoking status: Never   Smokeless tobacco: Never  Vaping Use   Vaping Use: Never used  Substance and Sexual Activity   Alcohol use: Yes    Alcohol/week: 0.0 standard drinks    Comment: occ   Drug use: No   Sexual activity: Yes  Other Topics Concern   Not on file  Social History Narrative   Not on file   Social Determinants of Health   Financial Resource Strain: Not on file  Food Insecurity: Food Insecurity Present   Worried About [place] in the Last Year: Sometimes true   Ran Out of Food in the Last Year: Never true  Transportation Needs: Not on file  Physical Activity: Not on file  Stress: Not on file  Social Connections: Not on file     Family History: The patient's family history includes Cancer in her mother, paternal aunt, and another family member; Hypertension in her father; Prostate cancer in her maternal grandfather; Stroke in her maternal aunt and another family member.  ROS:   All other ROS reviewed and negative. Pertinent positives noted in the HPI.     EKGs/Labs/Other Studies Reviewed:   The following studies were personally reviewed by me today:  EKG:  EKG is ordered today.  The ekg ordered today demonstrates NSR 89 bpm with NSTT, and was personally reviewed by me.   Recent Labs: 05/11/2021: ALT 30; BUN 7; Creatinine, Ser 0.59; Potassium 4.2; Sodium 136 05/15/2021: Hemoglobin 9.4; Platelets 172   Recent Lipid Panel No results found for: CHOL, TRIG, HDL, CHOLHDL, VLDL, LDLCALC, LDLDIRECT  Physical Exam:   VS:  BP 117/77   Pulse 89   Ht 5\' 3"  (1.6 m)   Wt 212 lb 12.8 oz (96.5 kg)   SpO2 99%   BMI 37.70 kg/m    Wt Readings from Last 3 Encounters:  09/12/21 212 lb 12.8 oz (96.5 kg)  04/20/21 225 lb 0.6 oz (102.1 kg)  12/07/20 220 lb (99.8 kg)    General: Well nourished, well developed, in no acute distress Head: Atraumatic,  normal size  Eyes: PEERLA, EOMI  Neck: Supple, no JVD Endocrine: No thryomegaly Cardiac: Normal S1, S2; RRR; no murmurs, rubs, or gallops Lungs: Clear to auscultation bilaterally, no wheezing, rhonchi or rales  Abd: Soft, nontender, no hepatomegaly  Ext: No edema, pulses 2+ Musculoskeletal: No deformities, BUE and BLE strength normal and equal Skin: Warm and dry, no rashes   Neuro: Alert and oriented to person, place, time, and situation, CNII-XII grossly  intact, no focal deficits  Psych: Normal mood and affect   ASSESSMENT:   ASHONTE ANGELUCCI is a 35 y.o. female who presents for the following: 1. Palpitations   2. Obesity (BMI 30-39.9)     PLAN:   1. Palpitations 2. Obesity (BMI 30-39.9) -Palpitations the last 6 months.  This is coincided with her son who was born prematurely who is now in the NICU at [facility].  She is out of work due to the need to be there for her son.  I do sympathize with her through this difficult time.  She has not had any symptoms in the last 2 weeks but was having them very frequently.  I would like to check a TSH.  She was also anemic in the hospital and most recent hemoglobin value was 9.4.  We will recheck labs today. -Her EKG is normal in office.  It has nonspecific ST-T changes which are likely related to obesity.  Her cardiovascular examination is normal. -Unclear if symptoms represent arrhythmia versus stress related.  I suspect with all that she is going through stress is a significant factor.  She has not had any symptoms in the last 2 weeks.  For now I would recommend we watch and see if symptoms recur.  If they do she can reach back out to our office and we can order a monitor.  Given lack of symptoms and normal exam and normal EKG we will hold on monitor at this time.  Disposition: Return if symptoms worsen or fail to improve.  Medication Adjustments/Labs and Tests Ordered: Current medicines are reviewed at length with the patient today.  Concerns  regarding medicines are outlined above.  Orders Placed This Encounter  Procedures   TSH   CBC   EKG 12-Lead    No orders of the defined types were placed in this encounter.   Patient Instructions  Medication Instructions:  The current medical regimen is effective;  continue present plan and medications.  *If you need a refill on your cardiac medications before your next appointment, please call your pharmacy*   Lab Work: TSH, CBC today   If you have labs (blood work) drawn today and your tests are completely normal, you will receive your results only by: [place] (if you have MyChart) OR A paper copy in the mail If you have any lab test that is abnormal or we need to change your treatment, we will call you to review the results.   Follow-Up: At [facility], you and your health needs are our priority.  As part of our continuing mission to provide you with exceptional heart care, we have created designated Provider Care Teams.  These Care Teams include your primary Cardiologist (physician) and Advanced Practice Providers (APPs -  Physician Assistants and Nurse Practitioners) who all work together to provide you with the care you need, when you need it.  We recommend signing up for the patient portal called "MyChart".  Sign up information is provided on this After Visit Summary.  MyChart is used to connect with patients for Virtual Visits (Telemedicine).  Patients are able to view lab/test results, encounter notes, upcoming appointments, etc.  Non-urgent messages can be sent to your provider as well.   To learn more about what you can do with MyChart, go to [email or web address].    Your next appointment:   As needed  The format for your next appointment:   In Person  Provider:   Eleonore Chiquito, MD  Signed, Addison Naegeli. Audie Box, MD, [place]  [address], [place], [place] [address] [phone]  09/12/2021 2:35 PM

## 2021-09-12 ENCOUNTER — Encounter: Payer: Self-pay | Admitting: Cardiovascular Disease

## 2021-09-12 ENCOUNTER — Encounter: Payer: Self-pay | Admitting: Physical Therapy

## 2021-09-12 ENCOUNTER — Other Ambulatory Visit: Payer: Self-pay

## 2021-09-12 ENCOUNTER — Ambulatory Visit (INDEPENDENT_AMBULATORY_CARE_PROVIDER_SITE_OTHER): Admitting: Cardiovascular Disease

## 2021-09-12 ENCOUNTER — Ambulatory Visit: Attending: Obstetrics & Gynecology | Admitting: Physical Therapy

## 2021-09-12 VITALS — BP 117/77 | HR 89 | Ht 63.0 in | Wt 212.8 lb

## 2021-09-12 DIAGNOSIS — R252 Cramp and spasm: Secondary | ICD-10-CM | POA: Insufficient documentation

## 2021-09-12 DIAGNOSIS — M6281 Muscle weakness (generalized): Secondary | ICD-10-CM | POA: Insufficient documentation

## 2021-09-12 DIAGNOSIS — E669 Obesity, unspecified: Secondary | ICD-10-CM

## 2021-09-12 DIAGNOSIS — R002 Palpitations: Secondary | ICD-10-CM | POA: Diagnosis not present

## 2021-09-12 NOTE — Patient Instructions (Signed)
Medication Instructions:  The current medical regimen is effective;  continue present plan and medications.  *If you need a refill on your cardiac medications before your next appointment, please call your pharmacy*   Lab Work: TSH, CBC today   If you have labs (blood work) drawn today and your tests are completely normal, you will receive your results only by: [place] (if you have MyChart) OR A paper copy in the mail If you have any lab test that is abnormal or we need to change your treatment, we will call you to review the results.   Follow-Up: At [facility], you and your health needs are our priority.  As part of our continuing mission to provide you with exceptional heart care, we have created designated Provider Care Teams.  These Care Teams include your primary Cardiologist (physician) and Advanced Practice Providers (APPs -  Physician Assistants and Nurse Practitioners) who all work together to provide you with the care you need, when you need it.  We recommend signing up for the patient portal called "MyChart".  Sign up information is provided on this After Visit Summary.  MyChart is used to connect with patients for Virtual Visits (Telemedicine).  Patients are able to view lab/test results, encounter notes, upcoming appointments, etc.  Non-urgent messages can be sent to your provider as well.   To learn more about what you can do with MyChart, go to [email or web address].    Your next appointment:   As needed  The format for your next appointment:   In Person  Provider:   Eleonore Chiquito, MD

## 2021-09-12 NOTE — Patient Instructions (Signed)
Access Code: [email or web address] URL: [email or web address] Date: 09/12/2021 Prepared by: Jari Favre  Exercises Supine Pelvic Floor Stretch - Hands on Knees - 1 x daily - 7 x weekly - 1 sets - 3 reps - 30 hold Happy Baby with Pelvic Floor Lengthening - 1 x daily - 7 x weekly - 1 sets - 3 reps - 30 hold Thoracic Sidebending with Towel Roll - 1 x daily - 7 x weekly - 1 sets - 10 reps - 5 sec hold Supine Lower Trunk Rotation - 1 x daily - 7 x weekly - 1 sets - 10 reps - 5 sec hold Sidelying Thoracic Rotation with Open Book - 1 x daily - 7 x weekly - 1 sets - 5 reps - 10 sec hold

## 2021-09-12 NOTE — Therapy (Signed)
[facility] @ [place], [place], [address] Phone: [phone]   Fax:  [phone]  Physical Therapy Evaluation  Patient Details  Name: Morgan Boyd MRN: [id number] Date of Birth: 1985/12/06 Referring Provider (PT): Waymon Amato, MD   Encounter Date: 09/12/2021   PT End of Session - 09/12/21 0903     Visit Number 1    Date for PT Re-Evaluation 12/05/21    Authorization Type tricare    PT Start Time 0800    PT Stop Time 0850    PT Time Calculation (min) 50 min    Activity Tolerance Patient tolerated treatment well    Behavior During Therapy [facility] for tasks assessed/performed             Past Medical History:  Diagnosis Date   Allergy    Bilateral ovarian cysts    Bronchitis    Fibroid    Vitamin D deficiency     Past Surgical History:  Procedure Laterality Date   CESAREAN SECTION N/A 05/14/2021   Procedure: CESAREAN SECTION;  Surgeon: Waymon Amato, MD;  Location: [facility];  Service: Obstetrics;  Laterality: N/A;   DILATION AND CURETTAGE OF UTERUS  2010   TONSILLECTOMY      There were no vitals filed for this visit.    Subjective Assessment - 09/12/21 0803     Subjective I had my son in July, he was my 3rd child.  Since then the diastasis has been large and uncomfortable.  I feel it bulging all the time, sitting is the least noticeable.  I can see it when standing.  I feel like I am having digestive issues and going more often and get a cramping.  I had a c-section last time.  Denies straining.  Cannot sleep because it is uncomfortable.  I have a lot of lower back pain when standing or walking a lot.  I haven't returned to previous exercise which was a step aerobics class.    Currently in Pain? Yes    Pain Score 4    from 4-8/10   Pain Location Abdomen    Pain Orientation Mid    Pain Descriptors / Indicators Aching;Sore;Cramping    Pain Type Acute pain    Pain Radiating Towards above umbilicus,  causes back pain    Pain Onset More than a month ago    Pain Frequency Intermittent    Aggravating Factors  sleeping or pressure on it    Multiple Pain Sites No                [facility] PT Assessment - 09/12/21 0001       Assessment   Medical Diagnosis M62.08 (ICD-10-CM) - Separation of muscle (nontraumatic), other site    Referring Provider (PT) Waymon Amato, MD    Onset Date/Surgical Date --   July 2022   Prior Therapy No      Precautions   Precautions None      Restrictions   Weight Bearing Restrictions No      Balance Screen   Has the patient fallen in the past 6 months No      [place] residence    Living Arrangements Spouse/significant other;Children   2 +baby is in nicu     Prior Function   Level of Independence Independent    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   Vocation Requirements driving to and from  [place] daily      Cognition   Overall Cognitive Status Within Functional Limits for tasks assessed      Posture/Postural Control   Posture/Postural Control Postural limitations    Postural Limitations Anterior pelvic tilt;Increased lumbar lordosis      ROM / Strength   AROM / PROM / Strength AROM;PROM;Strength      AROM   Overall AROM Comments lumbar flexion limited; more tension left trunk and less movement side bend to right and rotation right      PROM   Overall PROM Comments Left IR 30%      Strength   Overall Strength Comments Rt hip 5/5; Lt 4+/5      Flexibility   Soft Tissue Assessment /Muscle Length yes    Hamstrings Lt 70%; Rt 80%      Palpation   Palpation comment lumbar tight more on the Rt side      Ambulation/Gait   Gait Pattern Within Functional Limits                        Objective measurements completed on examination: See above findings.     Pelvic Floor Special Questions - 09/12/21 0001     Prior Pelvic/Prostate Exam Yes    Are you Pregnant or attempting  pregnancy? No    Prior Pregnancies Yes    Number of Pregnancies 3    Number of C-Sections 1    Number of Vaginal Deliveries 2    Diastasis Recti yes - 4+fingers and over one knuckle deep    Currently Sexually Active Yes    Is this Painful Yes    Marinoff Scale discomfort that does not affect completion    Urinary Leakage Yes    How often when sneezing or coughing    Pad use no    Activities that cause leaking Coughing;Sneezing    Urinary urgency Yes    Urinary frequency normal    Fecal incontinence --   can feel urgent withthe cramping   Falling out feeling (prolapse) No    Skin Integrity Intact   dryness   Prolapse None    Pelvic Floor Internal Exam pt identity confirmed and informed consent given to perofrm internal assessment    Exam Type Vaginal    Palpation tight levators Rt side    Strength weak squeeze, no lift    Strength # of reps 3    Strength # of seconds 2    Tone high                       PT Education - 09/12/21 0902     Education Details Access Code: [email or web address]    Person(s) Educated Patient    Methods Explanation;Demonstration;Tactile cues;Verbal cues;Handout    Comprehension Verbalized understanding;Returned demonstration              PT Short Term Goals - 09/12/21 0912       PT SHORT TERM GOAL #1   Title Pt will report 50% less discomfort with intercourse    Time 12    Period Weeks    Status New    Target Date 10/10/21      PT SHORT TERM GOAL #2   Title ind with initial HEP               PT Long Term Goals - 09/12/21 0905       PT LONG TERM GOAL #1  Title Pt will report she is able to cough and sneeze without leakage    Time 12    Period Weeks    Status New    Target Date 12/05/21      PT LONG TERM GOAL #2   Title Pt will report at least 50% less back pain and stiffness when standing due to improved core strength    Time 12    Period Weeks    Status New    Target Date 12/05/21      PT LONG TERM GOAL #3    Title Pt will be ind with advanced HEP for return to normal exercise routine    Time 12    Period Weeks    Status New    Target Date 12/05/21      PT LONG TERM GOAL #4   Title Pt will demo decreased abdominal separation of < 3 fingers above umbilicus and less then one knuckle deep in the separation for greater stability during functional movements    Time 12    Period Weeks    Status New    Target Date 12/05/21      PT LONG TERM GOAL #5   Title Pt will demo pelvic floor contraction 3/10 to return to more high impact activities.    Time 12    Period Weeks    Status New    Target Date 12/05/21                    Plan - 09/12/21 0912     Clinical Impression Statement Pt presents to skilled PT due to diastasis rectus that is very uncomfortable and limiting activity leve.  She had therapy for this previously and felt better but recently had another baby that was c-section delivery and this is the worst it has been.  Pt demonstrates stiffness in lumbar, thoracic spine and ribcage.  Pt has limited AROM in her spine and PROM in hip as mentioned above.  Pt has pelvic floor 2/5 and holding for 2 seconds with high tone on the Rt side.  Pt has DRA fo 4+ fingers and depth of over one knuckle.  She builges significantly superior to the umbilicus. Pt will benefit from skilled PT to address impairments and im    Personal Factors and Comorbidities Time since onset of injury/illness/exacerbation;Past/Current Experience;Comorbidity 2    Comorbidities c-section; 3 deliveries;    Examination-Activity Limitations Sleep;Continence    Examination-Participation Restrictions Community Activity    Stability/Clinical Decision Making Evolving/Moderate complexity    Clinical Decision Making Moderate    Rehab Potential Good    PT Frequency 1x / week    PT Duration 12 weeks    PT Treatment/Interventions ADLs/Self Care Home Management;Biofeedback;Cryotherapy;Electrical Stimulation;Moist Heat;Therapeutic  exercise;Therapeutic activities;Neuromuscular re-education;Patient/family education;Manual techniques;Passive range of motion;Taping;Dry needling    PT Next Visit Plan lumbar and thoracic STM (may need to do in sidelying); foam rolling glutes and maybe pelvic floor    PT Home Exercise Plan Access Code: [email or web address]    Consulted and Agree with Plan of Care Patient             Patient will benefit from skilled therapeutic intervention in order to improve the following deficits and impairments:  Increased muscle spasms, Impaired tone, Postural dysfunction, Pain, Decreased strength, Decreased endurance  Visit Diagnosis: Muscle weakness (generalized)  Cramp and spasm     Problem List Patient Active Problem List   Diagnosis Date Noted   Acute blood loss anemia  05/17/2021   Preeclampsia 05/17/2021   Normal postpartum course 05/17/2021   Delivery by classical cesarean section 05/14/2021   Gestational hypertension 04/23/2021   Fibroids 04/23/2021   Vitamin D deficiency 04/23/2021   IUGR, antenatal 04/20/2021   AMA (advanced maternal age) multigravida 35+ 04/20/2021    Jule Ser, PT 09/12/2021, 10:07 AM  [facility] @ [place], [place], [address] Phone: [phone]   Fax:  [phone]  Name: JULIA ALKHATIB MRN: [id number] Date of Birth: 1986/09/22

## 2021-09-13 LAB — CBC
Hematocrit: 40.9 % (ref 34.0–46.6)
Hemoglobin: 13.2 g/dL (ref 11.1–15.9)
MCH: 27.3 pg (ref 26.6–33.0)
MCHC: 32.3 g/dL (ref 31.5–35.7)
MCV: 85 fL (ref 79–97)
Platelets: 327 10*3/uL (ref 150–450)
RBC: 4.84 x10E6/uL (ref 3.77–5.28)
RDW: 14.1 % (ref 11.7–15.4)
WBC: 8.4 10*3/uL (ref 3.4–10.8)

## 2021-09-13 LAB — TSH: TSH: 2.32 u[IU]/mL (ref 0.450–4.500)

## 2021-10-04 ENCOUNTER — Ambulatory Visit (INDEPENDENT_AMBULATORY_CARE_PROVIDER_SITE_OTHER)

## 2021-10-04 ENCOUNTER — Other Ambulatory Visit: Payer: Self-pay

## 2021-10-04 DIAGNOSIS — L501 Idiopathic urticaria: Secondary | ICD-10-CM

## 2021-10-11 ENCOUNTER — Other Ambulatory Visit: Payer: Self-pay

## 2021-10-11 ENCOUNTER — Encounter: Payer: Self-pay | Admitting: Physical Therapy

## 2021-10-11 ENCOUNTER — Ambulatory Visit: Attending: Obstetrics & Gynecology | Admitting: Physical Therapy

## 2021-10-11 DIAGNOSIS — M6281 Muscle weakness (generalized): Secondary | ICD-10-CM | POA: Insufficient documentation

## 2021-10-11 DIAGNOSIS — R252 Cramp and spasm: Secondary | ICD-10-CM | POA: Insufficient documentation

## 2021-10-11 NOTE — Patient Instructions (Signed)
Access Code: [email or web address] URL: [email or web address] Date: 10/11/2021 Prepared by: Jari Favre  Exercises Supine Pelvic Floor Stretch - Hands on Knees - 1 x daily - 7 x weekly - 1 sets - 3 reps - 30 hold Happy Baby with Pelvic Floor Lengthening - 1 x daily - 7 x weekly - 1 sets - 3 reps - 30 hold Thoracic Sidebending with Towel Roll - 1 x daily - 7 x weekly - 1 sets - 10 reps - 5 sec hold Supine Lower Trunk Rotation - 1 x daily - 7 x weekly - 1 sets - 10 reps - 5 sec hold Sidelying Thoracic Rotation with Open Book - 1 x daily - 7 x weekly - 1 sets - 5 reps - 10 sec hold Bent Knee Fallouts - 1 x daily - 7 x weekly - 3 sets - 10 reps Hooklying Small March - 1 x daily - 7 x weekly - 10 reps - 2 sets

## 2021-10-11 NOTE — Therapy (Signed)
[facility] @ [place], [place], [address] Phone: [phone]   Fax:  [phone]  Physical Therapy Treatment  Patient Details  Name: Morgan Boyd MRN: [id number] Date of Birth: [birth year] Referring Provider (PT): Waymon Amato, MD   Encounter Date: 10/11/2021   PT End of Session - 10/11/21 0944     Visit Number 2    Date for PT Re-Evaluation 12/05/21    Authorization Type tricare    PT Start Time [id number]   late   PT Stop Time 1013    PT Time Calculation (min) 34 min    Activity Tolerance Patient tolerated treatment well    Behavior During Therapy [facility] for tasks assessed/performed             Past Medical History:  Diagnosis Date   Allergy    Bilateral ovarian cysts    Bronchitis    Fibroid    Gestational hypertension    Vitamin D deficiency     Past Surgical History:  Procedure Laterality Date   CESAREAN SECTION N/A 05/14/2021   Procedure: CESAREAN SECTION;  Surgeon: Waymon Amato, MD;  Location: [facility];  Service: Obstetrics;  Laterality: N/A;   DILATION AND CURETTAGE OF UTERUS  2010   TONSILLECTOMY      There were no vitals filed for this visit.   Subjective Assessment - 10/11/21 0943     Subjective Pt is the same, first f/u visit today    Currently in Pain? No/denies                               [facility] Adult PT Treatment/Exercise - 10/11/21 0001       Exercises   Exercises Lumbar      Lumbar Exercises: Stretches   Other Lumbar Stretch Exercise thoracic rotation stretch 3 x 10sec    Other Lumbar Stretch Exercise trunk rotation 3 x 10 sec      Lumbar Exercises: Supine   Other Supine Lumbar Exercises bent knee fall out - cues to keep breathing while engaged core - used VC and TC - 15x      Manual Therapy   Manual Therapy Soft tissue mobilization    Soft tissue mobilization gluteal massage and lumbar massage; sitting on noodle to release pelvic floor muscles                      PT Education - 10/11/21 1014     Education Details Access Code: [email or web address]    Person(s) Educated Patient    Methods Explanation;Demonstration;Tactile cues;Verbal cues;Handout    Comprehension Returned demonstration;Verbalized understanding              PT Short Term Goals - 10/11/21 1014       PT SHORT TERM GOAL #1   Title Pt will report 50% less discomfort with intercourse    Status On-going      PT SHORT TERM GOAL #2   Title ind with initial HEP    Status On-going               PT Long Term Goals - 09/12/21 0905       PT LONG TERM GOAL #1   Title Pt will report she is able to cough and sneeze without leakage    Time 12    Period Weeks    Status New    Target Date 12/05/21  PT LONG TERM GOAL #2   Title Pt will report at least 50% less back pain and stiffness when standing due to improved core strength    Time 12    Period Weeks    Status New    Target Date 12/05/21      PT LONG TERM GOAL #3   Title Pt will be ind with advanced HEP for return to normal exercise routine    Time 12    Period Weeks    Status New    Target Date 12/05/21      PT LONG TERM GOAL #4   Title Pt will demo decreased abdominal separation of < 3 fingers above umbilicus and less then one knuckle deep in the separation for greater stability during functional movements    Time 12    Period Weeks    Status New    Target Date 12/05/21      PT LONG TERM GOAL #5   Title Pt will demo pelvic floor contraction 3/10 to return to more high impact activities.    Time 12    Period Weeks    Status New    Target Date 12/05/21                   Plan - 10/11/21 1007     Clinical Impression Statement Pt did well with initial exercises and stretches.  Able to do intial HEP correctly.  Pt has a hard time engaging the core for longer than several seconds at a time in basic positions.  Pt was given exercises to continue working on building endurance at home.     PT Treatment/Interventions ADLs/Self Care Home Management;Biofeedback;Cryotherapy;Electrical Stimulation;Moist Heat;Therapeutic exercise;Therapeutic activities;Neuromuscular re-education;Patient/family education;Manual techniques;Passive range of motion;Taping;Dry needling    PT Home Exercise Plan Access Code: [email or web address]    Consulted and Agree with Plan of Care Patient             Patient will benefit from skilled therapeutic intervention in order to improve the following deficits and impairments:  Increased muscle spasms, Impaired tone, Postural dysfunction, Pain, Decreased strength, Decreased endurance  Visit Diagnosis: Muscle weakness (generalized)  Cramp and spasm     Problem List Patient Active Problem List   Diagnosis Date Noted   Acute blood loss anemia 05/17/2021   Preeclampsia 05/17/2021   Normal postpartum course 05/17/2021   Delivery by classical cesarean section 05/14/2021   Gestational hypertension 04/23/2021   Fibroids 04/23/2021   Vitamin D deficiency 04/23/2021   IUGR, antenatal 04/20/2021   AMA (advanced maternal age) multigravida 35+ 04/20/2021    Jule Ser, PT 10/11/2021, 10:15 AM  [facility] @ [place], [place], [address] Phone: [phone]   Fax:  [phone]  Name: Morgan Boyd MRN: [id number] Date of Birth: Feb 09, 1986

## 2021-10-18 ENCOUNTER — Other Ambulatory Visit: Payer: Self-pay

## 2021-10-18 ENCOUNTER — Ambulatory Visit: Admitting: Physical Therapy

## 2021-10-18 DIAGNOSIS — M6281 Muscle weakness (generalized): Secondary | ICD-10-CM

## 2021-10-18 DIAGNOSIS — R252 Cramp and spasm: Secondary | ICD-10-CM

## 2021-10-18 NOTE — Therapy (Signed)
[facility] @ [place], [place], [address] Phone: [phone]   Fax:  [phone]  Physical Therapy Treatment  Patient Details  Name: Morgan Boyd MRN: [id number] Date of Birth: 10-14-1986 Referring Provider (PT): Waymon Amato, MD   Encounter Date: 10/18/2021   PT End of Session - 10/18/21 0932     Visit Number 3    Date for PT Re-Evaluation 12/05/21    Authorization Type tricare    PT Start Time 0931    PT Stop Time 1009    PT Time Calculation (min) 38 min    Activity Tolerance Patient tolerated treatment well    Behavior During Therapy [facility] for tasks assessed/performed             Past Medical History:  Diagnosis Date   Allergy    Bilateral ovarian cysts    Bronchitis    Fibroid    Gestational hypertension    Vitamin D deficiency     Past Surgical History:  Procedure Laterality Date   CESAREAN SECTION N/A 05/14/2021   Procedure: CESAREAN SECTION;  Surgeon: Waymon Amato, MD;  Location: [facility];  Service: Obstetrics;  Laterality: N/A;   DILATION AND CURETTAGE OF UTERUS  2010   TONSILLECTOMY      There were no vitals filed for this visit.   Subjective Assessment - 10/18/21 0939     Subjective Pt feels the core engaging more.    Currently in Pain? No/denies                               [facility] Adult PT Treatment/Exercise - 10/18/21 0001       Self-Care   Self-Care Other Self-Care Comments    Other Self-Care Comments  urge handout and explain how to reduce urgency of bladder      Lumbar Exercises: Stretches   Other Lumbar Stretch Exercise lumbar flexion on table and cat cow on table    Other Lumbar Stretch Exercise --      Lumbar Exercises: Supine   Large Ball Abdominal Isometric 10 reps;1 second    Large Ball Abdominal Isometric Limitations overhead and presses      Lumbar Exercises: Sidelying   Clam Right;Left;10 reps                       PT  Short Term Goals - 10/11/21 1014       PT SHORT TERM GOAL #1   Title Pt will report 50% less discomfort with intercourse    Status On-going      PT SHORT TERM GOAL #2   Title ind with initial HEP    Status On-going               PT Long Term Goals - 10/18/21 0958       PT LONG TERM GOAL #1   Title Pt will report she is able to cough and sneeze without leakage    Baseline no leakage but almost when having a lot of urgency      PT LONG TERM GOAL #2   Title Pt will report at least 50% less back pain and stiffness when standing due to improved core strength    Baseline I had soreness after going to the gym      PT LONG TERM GOAL #3   Title Pt will be ind with advanced HEP for  return to normal exercise routine    Status On-going      PT LONG TERM GOAL #4   Title Pt will demo decreased abdominal separation of < 3 fingers above umbilicus and less then one knuckle deep in the separation for greater stability during functional movements    Status On-going      PT LONG TERM GOAL #5   Title Pt will demo pelvic floor contraction 3/10 to return to more high impact activities.    Status On-going                   Plan - 10/18/21 7867     Clinical Impression Statement Pt is doing better with exercises and able to engage the core.  She notices that it gets harder after about 10 reps of anything.  pt is engaging the core on the exhale but holding longer than that is difficult.  Pt will benefit from skilled PT to continue to progress core strength.    PT Treatment/Interventions ADLs/Self Care Home Management;Biofeedback;Cryotherapy;Electrical Stimulation;Moist Heat;Therapeutic exercise;Therapeutic activities;Neuromuscular re-education;Patient/family education;Manual techniques;Passive range of motion;Taping;Dry needling    PT Next Visit Plan lumbar and thoracic STM (may need to do in sidelying); foam rolling glutes and maybe pelvic floor    PT Home Exercise Plan Access Code:  [email or web address]    Consulted and Agree with Plan of Care Patient             Patient will benefit from skilled therapeutic intervention in order to improve the following deficits and impairments:  Increased muscle spasms, Impaired tone, Postural dysfunction, Pain, Decreased strength, Decreased endurance  Visit Diagnosis: Muscle weakness (generalized)  Cramp and spasm     Problem List Patient Active Problem List   Diagnosis Date Noted   Acute blood loss anemia 05/17/2021   Preeclampsia 05/17/2021   Normal postpartum course 05/17/2021   Delivery by classical cesarean section 05/14/2021   Gestational hypertension 04/23/2021   Fibroids 04/23/2021   Vitamin D deficiency 04/23/2021   IUGR, antenatal 04/20/2021   AMA (advanced maternal age) multigravida 35+ 04/20/2021    Jule Ser, PT 10/18/2021, 10:11 AM  [facility] @ [place], [place], [address] Phone: [phone]   Fax:  [phone]  Name: ASANI DENISTON MRN: [id number] Date of Birth: 20-Dec-1985

## 2021-10-23 ENCOUNTER — Ambulatory Visit: Attending: Obstetrics & Gynecology | Admitting: Physical Therapy

## 2021-10-23 ENCOUNTER — Other Ambulatory Visit: Payer: Self-pay

## 2021-10-23 ENCOUNTER — Encounter: Payer: Self-pay | Admitting: Physical Therapy

## 2021-10-23 DIAGNOSIS — M6281 Muscle weakness (generalized): Secondary | ICD-10-CM | POA: Insufficient documentation

## 2021-10-23 DIAGNOSIS — R252 Cramp and spasm: Secondary | ICD-10-CM | POA: Diagnosis present

## 2021-10-23 NOTE — Therapy (Signed)
[facility] @ [place], [place], [address] Phone: [phone]   Fax:  [phone]  Physical Therapy Treatment  Patient Details  Name: Morgan Boyd MRN: [id number] Date of Birth: [birth year] Referring Provider (PT): Waymon Amato, MD   Encounter Date: 10/23/2021   PT End of Session - 10/23/21 1233     Visit Number 4    Date for PT Re-Evaluation 12/05/21    Authorization Type tricare    PT Start Time 1230    PT Stop Time 4709    PT Time Calculation (min) 43 min    Activity Tolerance Patient tolerated treatment well    Behavior During Therapy [facility] for tasks assessed/performed             Past Medical History:  Diagnosis Date   Allergy    Bilateral ovarian cysts    Bronchitis    Fibroid    Gestational hypertension    Vitamin D deficiency     Past Surgical History:  Procedure Laterality Date   CESAREAN SECTION N/A 05/14/2021   Procedure: CESAREAN SECTION;  Surgeon: Waymon Amato, MD;  Location: [facility];  Service: Obstetrics;  Laterality: N/A;   DILATION AND CURETTAGE OF UTERUS  2010   TONSILLECTOMY      There were no vitals filed for this visit.   Subjective Assessment - 10/23/21 1232     Subjective Pt states she is doing well and notices the bulge happens later in the day.  When I am cooking for 3 hours my back starts to hurt.  But that is getting better too.    Currently in Pain? No/denies                               [facility] Adult PT Treatment/Exercise - 10/23/21 0001       Lumbar Exercises: Stretches   Hip Flexor Stretch Right;Left;20 seconds    Pelvic Tilt Limitations standing and on ball; circles on ball      Lumbar Exercises: Standing   Functional Squats Limitations wall squats wtih pelvic tilt   8 reps   Lifting Limitations 3lb dumbell lifting to 90 with exhale on exertion    Other Standing Lumbar Exercises pelvic tilt agains wall lifting    Other Standing Lumbar  Exercises shoulder isometric flexion wtih stepping      Lumbar Exercises: Seated   Hip Flexion on Ball Strengthening;Both;20 reps      Lumbar Exercises: Supine   Large Ball Abdominal Isometric 10 reps;1 second    Large Ball Abdominal Isometric Limitations overhead and presses    Other Supine Lumbar Exercises clam with yellow loop                       PT Short Term Goals - 10/23/21 1242       PT SHORT TERM GOAL #1   Title Pt will report 50% less discomfort with intercourse    Status On-going      PT SHORT TERM GOAL #2   Title ind with initial HEP    Status Achieved               PT Long Term Goals - 10/18/21 6283       PT LONG TERM GOAL #1   Title Pt will report she is able to cough and sneeze without leakage    Baseline no leakage but almost  when having a lot of urgency      PT LONG TERM GOAL #2   Title Pt will report at least 50% less back pain and stiffness when standing due to improved core strength    Baseline I had soreness after going to the gym      PT LONG TERM GOAL #3   Title Pt will be ind with advanced HEP for return to normal exercise routine    Status On-going      PT LONG TERM GOAL #4   Title Pt will demo decreased abdominal separation of < 3 fingers above umbilicus and less then one knuckle deep in the separation for greater stability during functional movements    Status On-going      PT LONG TERM GOAL #5   Title Pt will demo pelvic floor contraction 3/10 to return to more high impact activities.    Status On-going                   Plan - 10/23/21 1244     Clinical Impression Statement Pt was able to progress core exercises and today's session focused on standing.  Pt needed cues to keep core activated and not using the lumbar muscles. She became fatigued and needed cues to take breaks when form was getting off.  Pt will benefit from skilled PT to continue to address core strength and posture.    PT  Treatment/Interventions ADLs/Self Care Home Management;Biofeedback;Cryotherapy;Electrical Stimulation;Moist Heat;Therapeutic exercise;Therapeutic activities;Neuromuscular re-education;Patient/family education;Manual techniques;Passive range of motion;Taping;Dry needling    PT Next Visit Plan lumbar and thoracic STM (may need to do in sidelying); continue core strength    PT Home Exercise Plan Access Code: [email or web address]    Consulted and Agree with Plan of Care Patient             Patient will benefit from skilled therapeutic intervention in order to improve the following deficits and impairments:  Increased muscle spasms, Impaired tone, Postural dysfunction, Pain, Decreased strength, Decreased endurance  Visit Diagnosis: Muscle weakness (generalized)  Cramp and spasm     Problem List Patient Active Problem List   Diagnosis Date Noted   Acute blood loss anemia 05/17/2021   Preeclampsia 05/17/2021   Normal postpartum course 05/17/2021   Delivery by classical cesarean section 05/14/2021   Gestational hypertension 04/23/2021   Fibroids 04/23/2021   Vitamin D deficiency 04/23/2021   IUGR, antenatal 04/20/2021   AMA (advanced maternal age) multigravida 35+ 04/20/2021    Jule Ser, PT 10/23/2021, 1:16 PM  [facility] @ [place], [place], [address] Phone: [phone]   Fax:  [phone]  Name: Morgan Boyd MRN: [id number] Date of Birth: [birth year]

## 2021-10-30 ENCOUNTER — Encounter: Payer: Self-pay | Admitting: Physical Therapy

## 2021-10-30 ENCOUNTER — Ambulatory Visit: Admitting: Physical Therapy

## 2021-10-30 ENCOUNTER — Other Ambulatory Visit: Payer: Self-pay

## 2021-10-30 DIAGNOSIS — M6281 Muscle weakness (generalized): Secondary | ICD-10-CM

## 2021-10-30 DIAGNOSIS — R252 Cramp and spasm: Secondary | ICD-10-CM

## 2021-10-31 NOTE — Therapy (Signed)
[facility] @ [place], [place], [address] Phone: [phone]   Fax:  [phone]  Physical Therapy Treatment  Patient Details  Name: QUINTELLA MURA MRN: [id number] Date of Birth: 1986-08-19 Referring Provider (PT): Waymon Amato, MD   Encounter Date: 10/30/2021   PT End of Session - 10/30/21 1245     Visit Number 5    Date for PT Re-Evaluation 12/05/21    Authorization Type tricare    PT Start Time 1239    PT Stop Time 2595    PT Time Calculation (min) 38 min    Activity Tolerance Patient tolerated treatment well    Behavior During Therapy [facility] for tasks assessed/performed             Past Medical History:  Diagnosis Date   Allergy    Bilateral ovarian cysts    Bronchitis    Fibroid    Gestational hypertension    Vitamin D deficiency     Past Surgical History:  Procedure Laterality Date   CESAREAN SECTION N/A 05/14/2021   Procedure: CESAREAN SECTION;  Surgeon: Waymon Amato, MD;  Location: [facility];  Service: Obstetrics;  Laterality: N/A;   DILATION AND CURETTAGE OF UTERUS  2010   TONSILLECTOMY      There were no vitals filed for this visit.   Subjective Assessment - 10/30/21 1240     Subjective Pt states that she is noticing consistent small improvements in pain reduction and ability to stand for longer periods of time without LBP.    Currently in Pain? No/denies                               [facility] Adult PT Treatment/Exercise - 10/31/21 0001       Exercises   Exercises Knee/Hip      Lumbar Exercises: Stretches   Pelvic Tilt Limitations standing pelvic tilts against wall with breath 10 x 5 sec      Lumbar Exercises: Aerobic   Nustep 5 min, level 3      Lumbar Exercises: Standing   Lifting Limitations 4lb dumbell lifting to 90 with exhale on exertion while maintaining pelvic tilt again wall and seated on swiss ball 2 x 10    Other Standing Lumbar Exercises Split stance  hip hinge 10 x bil      Lumbar Exercises: Seated   Hip Flexion on Ball Strengthening;Both;20 reps    Other Seated Lumbar Exercises Swiss ball rows for increased core activation, GTB w/ handles, 2 x 10      Lumbar Exercises: Supine   Dead Bug 20 reps   4 lb DB bil, opposite LE remained supported to maintain appropriate lumbar position   Other Supine Lumbar Exercises BUE ext with GTB 2 x 10 for inc core activation      Lumbar Exercises: Quadruped   Madcat/Old Horse 20 reps      Knee/Hip Exercises: Standing   Hip Abduction Both;2 sets;10 reps   Performed on foam disc to increase core activation/pelvic stability requirement; performed as hip 3-way to include flexion and extension - notable decrease in balance LLE compared to RLE                      PT Short Term Goals - 10/23/21 1242       PT SHORT TERM GOAL #1   Title Pt will report 50% less discomfort  with intercourse    Status On-going      PT SHORT TERM GOAL #2   Title ind with initial HEP    Status Achieved               PT Long Term Goals - 10/18/21 0958       PT LONG TERM GOAL #1   Title Pt will report she is able to cough and sneeze without leakage    Baseline no leakage but almost when having a lot of urgency      PT LONG TERM GOAL #2   Title Pt will report at least 50% less back pain and stiffness when standing due to improved core strength    Baseline I had soreness after going to the gym      PT LONG TERM GOAL #3   Title Pt will be ind with advanced HEP for return to normal exercise routine    Status On-going      PT LONG TERM GOAL #4   Title Pt will demo decreased abdominal separation of < 3 fingers above umbilicus and less then one knuckle deep in the separation for greater stability during functional movements    Status On-going      PT LONG TERM GOAL #5   Title Pt will demo pelvic floor contraction 3/10 to return to more high impact activities.    Status On-going                    Plan - 10/31/21 0824     Clinical Impression Statement Today's session was focused on continued work on improved core strength.  Pt is doing well with exercises at home but still uses low back and demo's increased lordosis with fatigue.  Pt able to do half the session standing and using the wall for support and the rest of the session with lying down exercises.  She is not strong enough to have LE 90-90 while stabilizing the spine yet.  Pt will benefit from skilled PT to safely and correctly progress her core strength to improve function without increased pain.    PT Treatment/Interventions ADLs/Self Care Home Management;Biofeedback;Cryotherapy;Electrical Stimulation;Moist Heat;Therapeutic exercise;Therapeutic activities;Neuromuscular re-education;Patient/family education;Manual techniques;Passive range of motion;Taping;Dry needling    PT Next Visit Plan Continue to progress core strengthening activities with appropriate breath coordination; focus on strengthening to promote standing endurance with good pelvic stabilization    PT Home Exercise Plan Access Code: [email or web address]    Consulted and Agree with Plan of Care Patient             Patient will benefit from skilled therapeutic intervention in order to improve the following deficits and impairments:  Increased muscle spasms, Impaired tone, Postural dysfunction, Pain, Decreased strength, Decreased endurance  Visit Diagnosis: Muscle weakness (generalized)  Cramp and spasm     Problem List Patient Active Problem List   Diagnosis Date Noted   Acute blood loss anemia 05/17/2021   Preeclampsia 05/17/2021   Normal postpartum course 05/17/2021   Delivery by classical cesarean section 05/14/2021   Gestational hypertension 04/23/2021   Fibroids 04/23/2021   Vitamin D deficiency 04/23/2021   IUGR, antenatal 04/20/2021   AMA (advanced maternal age) multigravida 35+ 04/20/2021    Jule Ser, PT 10/31/2021, 8:25  AM  [facility] @ [place], [place], [address] Phone: [phone]   Fax:  [phone]  Name: JAYCEY GENS MRN: [id number] Date of Birth: 1986-05-18

## 2021-11-01 ENCOUNTER — Ambulatory Visit

## 2021-11-02 ENCOUNTER — Other Ambulatory Visit: Payer: Self-pay

## 2021-11-02 ENCOUNTER — Ambulatory Visit (INDEPENDENT_AMBULATORY_CARE_PROVIDER_SITE_OTHER)

## 2021-11-02 DIAGNOSIS — L501 Idiopathic urticaria: Secondary | ICD-10-CM | POA: Diagnosis not present

## 2021-11-06 ENCOUNTER — Ambulatory Visit: Admitting: Physical Therapy

## 2021-11-06 ENCOUNTER — Other Ambulatory Visit: Payer: Self-pay

## 2021-11-06 DIAGNOSIS — M6281 Muscle weakness (generalized): Secondary | ICD-10-CM

## 2021-11-06 DIAGNOSIS — R252 Cramp and spasm: Secondary | ICD-10-CM

## 2021-11-06 NOTE — Therapy (Signed)
[place] @ [place], [place], [address] Phone: [phone]   Fax:  [phone]  Physical Therapy Treatment  Patient Details  Name: ZORAH BACKES MRN: [id number] Date of Birth: [birth year] Referring Provider (PT): Waymon Amato, MD   Encounter Date: 11/06/2021   PT End of Session - 11/06/21 1300     Visit Number 6    Date for PT Re-Evaluation 12/05/21    Authorization Type tricare    PT Start Time 1247   late   PT Stop Time 1313    PT Time Calculation (min) 26 min    Activity Tolerance Patient tolerated treatment well    Behavior During Therapy [facility] for tasks assessed/performed             Past Medical History:  Diagnosis Date   Allergy    Bilateral ovarian cysts    Bronchitis    Fibroid    Gestational hypertension    Vitamin D deficiency     Past Surgical History:  Procedure Laterality Date   CESAREAN SECTION N/A 05/14/2021   Procedure: CESAREAN SECTION;  Surgeon: Waymon Amato, MD;  Location: [place];  Service: Obstetrics;  Laterality: N/A;   DILATION AND CURETTAGE OF UTERUS  2010   TONSILLECTOMY      There were no vitals filed for this visit.   Subjective Assessment - 11/06/21 1255     Subjective I feel stronger but not as much difference as the previous week, but I was standing for at least an hour cooking and no pain in my back    How long can you stand comfortably? an hour    Currently in Pain? No/denies                               [facility] Adult PT Treatment/Exercise - 11/06/21 0001       Lumbar Exercises: Supine   Pelvic Tilt 10 reps    Heel Slides 20 reps    Bridge 10 reps    Bridge Limitations cues to keep pelvis neutral      Lumbar Exercises: Sidelying   Hip Abduction 10 reps                       PT Short Term Goals - 10/23/21 1242       PT SHORT TERM GOAL #1   Title Pt will report 50% less discomfort with intercourse    Status On-going       PT SHORT TERM GOAL #2   Title ind with initial HEP    Status Achieved               PT Long Term Goals - 11/06/21 1402       PT LONG TERM GOAL #1   Title Pt will report she is able to cough and sneeze without leakage    Status On-going      PT LONG TERM GOAL #2   Title Pt will report at least 50% less back pain and stiffness when standing due to improved core strength    Status On-going      PT LONG TERM GOAL #3   Title Pt will be ind with advanced HEP for return to normal exercise routine    Status On-going      PT LONG TERM GOAL #4   Title Pt will demo decreased abdominal separation of <  3 fingers above umbilicus and less then one knuckle deep in the separation for greater stability during functional movements    Baseline almost less than 3 above umbilicus    Status On-going      PT LONG TERM GOAL #5   Title Pt will demo pelvic floor contraction 3/10 to return to more high impact activities.    Status On-going                   Plan - 11/06/21 1302     Clinical Impression Statement Pt was not able to get as many exercises in today due to being late, but she was able to continue to demonstrate good progress.  Pt still has DRA about about 2-3 fingers and tender to palpation along the diastasis.  Pt has more tension in the upper abdomen but more difficulty activating the lower abdominals.  Pt was given exercises and added to HEP to work on [place] in lower abdomen.  She needed cues to elevate the Rt pelvis more as well when doing bridges.  Pt will benefit from skilled PT to porgress strength and continue with POC    PT Treatment/Interventions ADLs/Self Care Home Management;Biofeedback;Cryotherapy;Electrical Stimulation;Moist Heat;Therapeutic exercise;Therapeutic activities;Neuromuscular re-education;Patient/family education;Manual techniques;Passive range of motion;Taping;Dry needling    PT Next Visit Plan core strength with lower abdominal focus,  coordination of breathing, progress to more in standing    PT Home Exercise Plan Access Code: [email or web address]    Consulted and Agree with Plan of Care Patient             Patient will benefit from skilled therapeutic intervention in order to improve the following deficits and impairments:  Increased muscle spasms, Impaired tone, Postural dysfunction, Pain, Decreased strength, Decreased endurance  Visit Diagnosis: Muscle weakness (generalized)  Cramp and spasm     Problem List Patient Active Problem List   Diagnosis Date Noted   Acute blood loss anemia 05/17/2021   Preeclampsia 05/17/2021   Normal postpartum course 05/17/2021   Delivery by classical cesarean section 05/14/2021   Gestational hypertension 04/23/2021   Fibroids 04/23/2021   Vitamin D deficiency 04/23/2021   IUGR, antenatal 04/20/2021   AMA (advanced maternal age) multigravida 35+ 04/20/2021    Jule Ser, PT 11/06/2021, 2:50 PM  [facility] @ [place], [place], [address] Phone: [phone]   Fax:  [phone]  Name: AMAYRANY CAFARO MRN: [id number] Date of Birth: 1986/06/22

## 2021-11-13 ENCOUNTER — Encounter: Admitting: Physical Therapy

## 2021-11-20 ENCOUNTER — Other Ambulatory Visit: Payer: Self-pay

## 2021-11-20 ENCOUNTER — Ambulatory Visit: Admitting: Physical Therapy

## 2021-11-20 ENCOUNTER — Encounter: Payer: Self-pay | Admitting: Physical Therapy

## 2021-11-20 DIAGNOSIS — R252 Cramp and spasm: Secondary | ICD-10-CM

## 2021-11-20 DIAGNOSIS — M6281 Muscle weakness (generalized): Secondary | ICD-10-CM | POA: Diagnosis not present

## 2021-11-20 NOTE — Therapy (Signed)
[facility] @ [place], [place], [address] Phone: [phone]   Fax:  [phone]  Physical Therapy Treatment  Patient Details  Name: Morgan Boyd MRN: [id number] Date of Birth: 01-May-1986 Referring Provider (PT): Waymon Amato, MD   Encounter Date: 11/20/2021   PT End of Session - 11/20/21 1244     Visit Number 7    Date for PT Re-Evaluation 12/05/21    Authorization Type tricare    PT Start Time 1233    PT Stop Time 1311    PT Time Calculation (min) 38 min    Activity Tolerance Patient tolerated treatment well    Behavior During Therapy [facility] for tasks assessed/performed             Past Medical History:  Diagnosis Date   Allergy    Bilateral ovarian cysts    Bronchitis    Fibroid    Gestational hypertension    Vitamin D deficiency     Past Surgical History:  Procedure Laterality Date   CESAREAN SECTION N/A 05/14/2021   Procedure: CESAREAN SECTION;  Surgeon: Waymon Amato, MD;  Location: [place];  Service: Obstetrics;  Laterality: N/A;   DILATION AND CURETTAGE OF UTERUS  2010   TONSILLECTOMY      There were no vitals filed for this visit.   Subjective Assessment - 11/20/21 1238     Subjective Pt states she is feeling pretty good.  I haven't done anything because my son had surgery.  Pt states she has a lot of leakage 2-4x per month    How long can you stand comfortably? an hour    Currently in Pain? Yes    Pain Score 2     Pain Location Back    Pain Descriptors / Indicators Aching;Sore    Pain Type Acute pain    Pain Onset More than a month ago    Pain Frequency Intermittent    Multiple Pain Sites No                                        PT Education - 11/20/21 1311     Education Details Access Code: [email or web address]    Person(s) Educated Patient    Methods Explanation;Demonstration;Tactile cues;Verbal cues;Handout    Comprehension Verbalized  understanding;Returned demonstration              PT Short Term Goals - 10/23/21 1242       PT SHORT TERM GOAL #1   Title Pt will report 50% less discomfort with intercourse    Status On-going      PT SHORT TERM GOAL #2   Title ind with initial HEP    Status Achieved               PT Long Term Goals - 11/20/21 1248       PT LONG TERM GOAL #1   Title Pt will report she is able to cough and sneeze without leakage    Baseline sneezing and coughing    Status On-going      PT LONG TERM GOAL #2   Title Pt will report at least 50% less back pain and stiffness when standing due to improved core strength    Baseline It is better by about 60% improved    Status Achieved      PT LONG TERM GOAL #  3   Title Pt will be ind with advanced HEP for return to normal exercise routine    Status On-going                   Plan - 11/20/21 1246     Clinical Impression Statement Pt continues to focus on pelvic control when doing functional activities.  Needs some cues to keep pelvis neutral. Pt needed cues to exhale on exertion and added more exercises with focus on pelvic floor.  Pt met goal for decreased back pain but still having a lot of pelvic floor weakness and leakage when coughing and sneezing.    PT Treatment/Interventions ADLs/Self Care Home Management;Biofeedback;Cryotherapy;Electrical Stimulation;Moist Heat;Therapeutic exercise;Therapeutic activities;Neuromuscular re-education;Patient/family education;Manual techniques;Passive range of motion;Taping;Dry needling    PT Next Visit Plan core strength with lower abdominal focus, coordination of breathing, progress to more in standing    PT Home Exercise Plan Access Code: [email or web address]    Consulted and Agree with Plan of Care Patient             Patient will benefit from skilled therapeutic intervention in order to improve the following deficits and impairments:  Increased muscle spasms, Impaired tone, Postural  dysfunction, Pain, Decreased strength, Decreased endurance  Visit Diagnosis: Muscle weakness (generalized)  Cramp and spasm     Problem List Patient Active Problem List   Diagnosis Date Noted   Acute blood loss anemia 05/17/2021   Preeclampsia 05/17/2021   Normal postpartum course 05/17/2021   Delivery by classical cesarean section 05/14/2021   Gestational hypertension 04/23/2021   Fibroids 04/23/2021   Vitamin D deficiency 04/23/2021   IUGR, antenatal 04/20/2021   AMA (advanced maternal age) multigravida 35+ 04/20/2021    Jule Ser, PT 11/20/2021, 1:11 PM  [facility] @ [place], [place], [address] Phone: [phone]   Fax:  [phone]  Name: Morgan Boyd MRN: [id number] Date of Birth: 1986/09/10

## 2021-11-20 NOTE — Patient Instructions (Signed)
Access Code: [email or web address] URL: [email or web address] Date: 11/20/2021 Prepared by: Jari Favre  Exercises Supine Pelvic Floor Stretch - Hands on Knees - 1 x daily - 7 x weekly - 1 sets - 3 reps - 30 hold Happy Baby with Pelvic Floor Lengthening - 1 x daily - 7 x weekly - 1 sets - 3 reps - 30 hold Thoracic Sidebending with Towel Roll - 1 x daily - 7 x weekly - 1 sets - 10 reps - 5 sec hold Supine Lower Trunk Rotation - 1 x daily - 7 x weekly - 1 sets - 10 reps - 5 sec hold Sidelying Thoracic Rotation with Open Book - 1 x daily - 7 x weekly - 1 sets - 5 reps - 10 sec hold Bent Knee Fallouts - 1 x daily - 7 x weekly - 3 sets - 10 reps Hooklying Small March - 1 x daily - 7 x weekly - 2 sets - 10 reps Plank with Shoulder Flexion on Counter - 1 x daily - 7 x weekly - 2 sets - 10 reps Clam - 1 x daily - 7 x weekly - 2 sets - 10 reps Standing Shoulder Flexion Reactive Isometrics with Elbow Extended - 1 x daily - 7 x weekly - 3 sets - 10 reps Hooklying Isometric Clamshell - 1 x daily - 7 x weekly - 2 sets - 10 reps Supine Bridge with Spinal Articulation - 1 x daily - 7 x weekly - 3 sets - 10 reps Supine Transversus Abdominis Bracing with Heel Slide - 1 x daily - 7 x weekly - 2 sets - 10 reps Sidelying Hip Abduction - 1 x daily - 7 x weekly - 3 sets - 10 reps Standing Low Back Flexion at Table - 3 x daily - 7 x weekly - 1 sets - 10 reps - 3 sec hold

## 2021-11-30 ENCOUNTER — Ambulatory Visit

## 2021-12-03 ENCOUNTER — Other Ambulatory Visit: Payer: Self-pay

## 2021-12-03 ENCOUNTER — Ambulatory Visit (INDEPENDENT_AMBULATORY_CARE_PROVIDER_SITE_OTHER)

## 2021-12-03 DIAGNOSIS — L501 Idiopathic urticaria: Secondary | ICD-10-CM | POA: Diagnosis not present

## 2021-12-04 ENCOUNTER — Encounter: Payer: Self-pay | Admitting: Physical Therapy

## 2021-12-04 ENCOUNTER — Ambulatory Visit: Attending: Obstetrics & Gynecology | Admitting: Physical Therapy

## 2021-12-04 DIAGNOSIS — R252 Cramp and spasm: Secondary | ICD-10-CM | POA: Insufficient documentation

## 2021-12-04 DIAGNOSIS — M6281 Muscle weakness (generalized): Secondary | ICD-10-CM | POA: Diagnosis not present

## 2021-12-04 NOTE — Therapy (Addendum)
[facility] @ [place], [place], [address] Phone: [phone]   Fax:  [phone]  Physical Therapy Treatment  Patient Details  Name: Morgan Boyd MRN: [id number] Date of Birth: 04/22/1986 Referring Provider (PT): Waymon Amato, MD   Encounter Date: 12/04/2021   PT End of Session - 12/04/21 1359     Visit Number 8    Date for PT Re-Evaluation 12/05/21    Authorization Type tricare    PT Start Time 1235    PT Stop Time 1315    PT Time Calculation (min) 40 min    Activity Tolerance Patient tolerated treatment well    Behavior During Therapy [facility] for tasks assessed/performed             Past Medical History:  Diagnosis Date   Allergy    Bilateral ovarian cysts    Bronchitis    Fibroid    Gestational hypertension    Vitamin D deficiency     Past Surgical History:  Procedure Laterality Date   CESAREAN SECTION N/A 05/14/2021   Procedure: CESAREAN SECTION;  Surgeon: Waymon Amato, MD;  Location: [facility];  Service: Obstetrics;  Laterality: N/A;   DILATION AND CURETTAGE OF UTERUS  2010   TONSILLECTOMY      There were no vitals filed for this visit.   Subjective Assessment - 12/04/21 1244     Subjective I was sick for all of last week.  I had a cough with it so everything was worse and had to use pads    Currently in Pain? No/denies                               [facility] Adult PT Treatment/Exercise - 12/05/21 0001       Self-Care   Other Self-Care Comments  review of HEP as needed to reinforce good techniques      Lumbar Exercises: Aerobic   Elliptical 3/3 fwd/back with PT present for status update      Lumbar Exercises: Standing   Wall Slides 10 reps   with pelvic tilt   Other Standing Lumbar Exercises hip flexion on step for gluteal and quad strength;      Lumbar Exercises: Supine   Other Supine Lumbar Exercises bent knee fallout - 20x    Other Supine Lumbar Exercises kegel  in hooklying TC and holding 5 sec                       PT Short Term Goals - 12/05/21 0921       PT SHORT TERM GOAL #1   Title Pt will report 50% less discomfort with intercourse    Status Deferred      PT SHORT TERM GOAL #2   Title ind with initial HEP    Status Achieved               PT Long Term Goals - 12/05/21 0920       PT LONG TERM GOAL #1   Title Pt will report she is able to cough and sneeze without leakage    Baseline sneezing and coughing without leakage butnot when sick    Status Partially Met      PT LONG TERM GOAL #2   Title Pt will report at least 50% less back pain and stiffness when standing due to improved core strength    Status  Achieved      PT LONG TERM GOAL #3   Title Pt will be ind with advanced HEP for return to normal exercise routine    Status Partially Met      PT LONG TERM GOAL #4   Title Pt will demo decreased abdominal separation of < 3 fingers above umbilicus and less then one knuckle deep in the separation for greater stability during functional movements    Baseline almost less than 3 above umbilicus    Status Partially Met      PT LONG TERM GOAL #5   Title Pt will demo pelvic floor contraction 3/10 to return to more high impact activities.    Status On-going                   Plan - 12/05/21 1062     Clinical Impression Statement Pt is doing much better overall.  Pt feels like back pain is less and leakage less other than when she was sick and coughing a lot.  Pt hasHEP and is dooing well with exercises when she can.  She is very busy taking care of 2 children and infant who is still in NICU in [place].  Pt will benefit from skilled PT if needed to progress HEP.  Not sure if she will return at this time, but will follow up if more appointments are needed or she may work with what she has for now.    PT Treatment/Interventions ADLs/Self Care Home Management;Biofeedback;Cryotherapy;Electrical Stimulation;Moist  Heat;Therapeutic exercise;Therapeutic activities;Neuromuscular re-education;Patient/family education;Manual techniques;Passive range of motion;Taping;Dry needling    PT Next Visit Plan re-assess or d/c    PT Home Exercise Plan Access Code: [email or web address]    Consulted and Agree with Plan of Care Patient             Patient will benefit from skilled therapeutic intervention in order to improve the following deficits and impairments:  Increased muscle spasms, Impaired tone, Postural dysfunction, Pain, Decreased strength, Decreased endurance  Visit Diagnosis: Muscle weakness (generalized)  Cramp and spasm     Problem List Patient Active Problem List   Diagnosis Date Noted   Acute blood loss anemia 05/17/2021   Preeclampsia 05/17/2021   Normal postpartum course 05/17/2021   Delivery by classical cesarean section 05/14/2021   Gestational hypertension 04/23/2021   Fibroids 04/23/2021   Vitamin D deficiency 04/23/2021   IUGR, antenatal 04/20/2021   AMA (advanced maternal age) multigravida 35+ 04/20/2021    Jule Ser, PT 12/05/2021, 9:28 AM  [facility] @ [place], [place], [address] Phone: [phone]   Fax:  [phone]  Name: Morgan Boyd MRN: [id number] Date of Birth: 07-15-1986   PHYSICAL THERAPY DISCHARGE SUMMARY  Visits from Start of Care: 8  Current functional level related to goals / functional outcomes:   See above goals Remaining deficits: See above   Education / Equipment: HEP   Patient agrees to discharge. Patient goals were partially met. Patient is being discharged due to not returning since the last visit.  Gustavus Bryant, PT 04/04/22 11:42 AM

## 2021-12-31 ENCOUNTER — Ambulatory Visit (INDEPENDENT_AMBULATORY_CARE_PROVIDER_SITE_OTHER)

## 2021-12-31 ENCOUNTER — Other Ambulatory Visit: Payer: Self-pay

## 2021-12-31 DIAGNOSIS — L501 Idiopathic urticaria: Secondary | ICD-10-CM | POA: Diagnosis not present

## 2022-01-28 ENCOUNTER — Ambulatory Visit (INDEPENDENT_AMBULATORY_CARE_PROVIDER_SITE_OTHER)

## 2022-01-28 DIAGNOSIS — L501 Idiopathic urticaria: Secondary | ICD-10-CM | POA: Diagnosis not present

## 2022-02-15 IMAGING — US US MFM OB LIMITED
1 series · 14 of 27 positions shown · non-contrast
Comparison: none

[Series 1: us mfm ob limited · 14 of 27 slices shown]
[im 1/27]
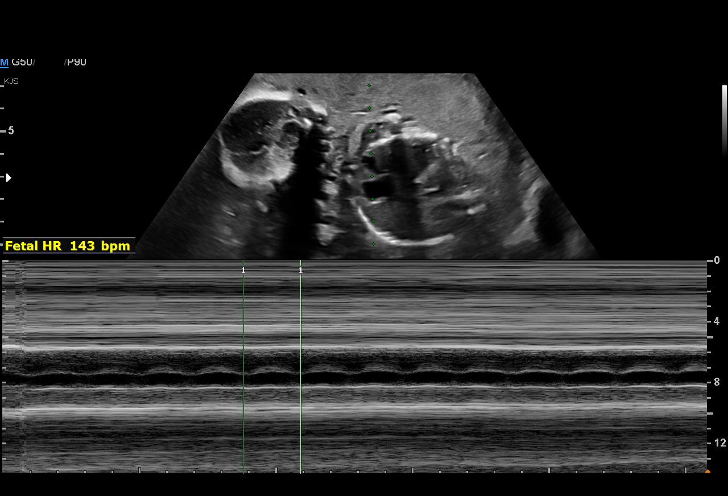
[im 3/27]
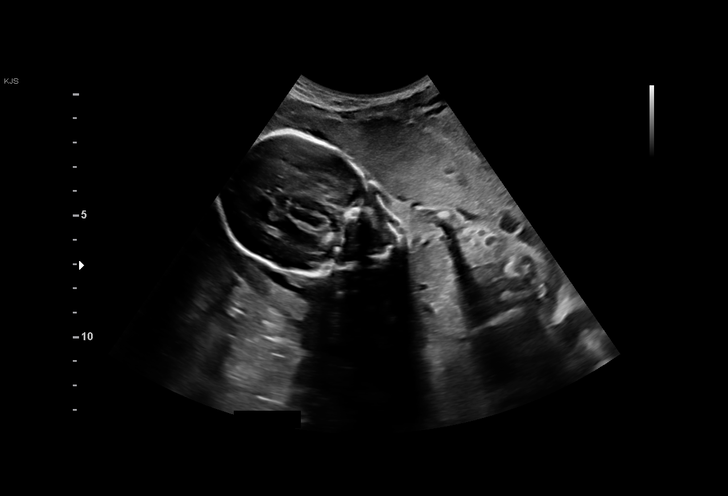
[im 5/27]
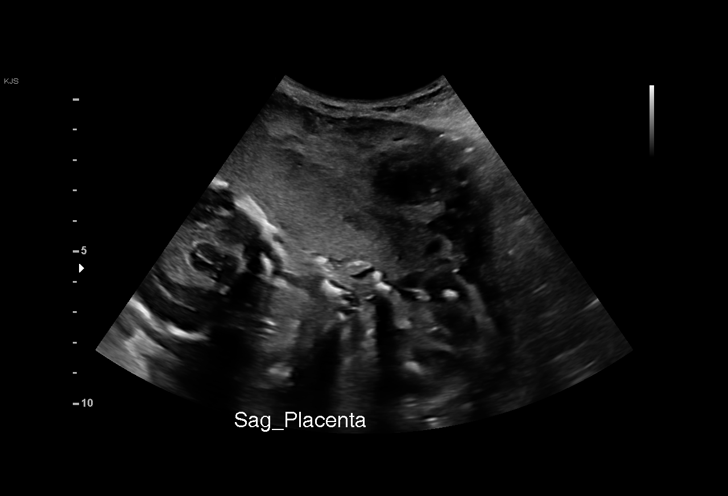
[im 7/27]
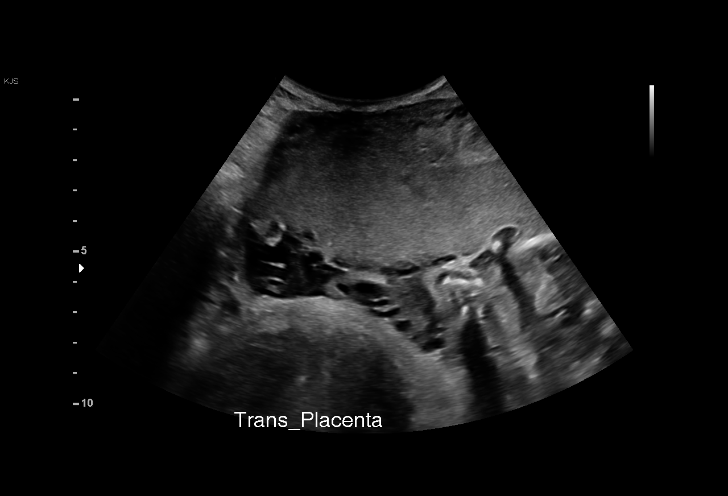
[im 9/27]
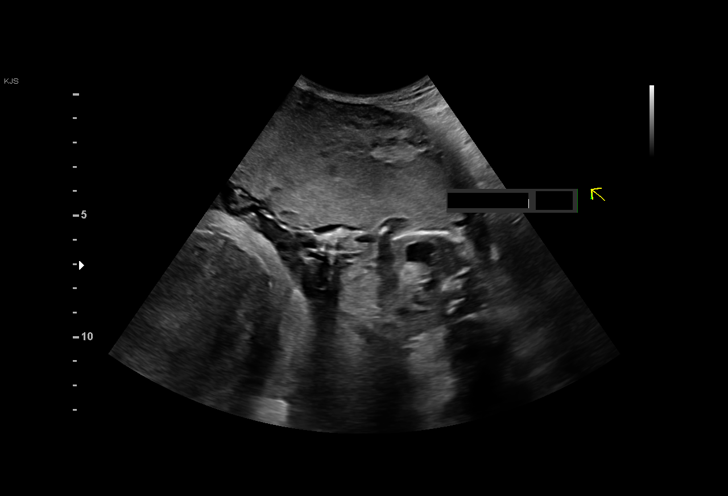
[im 11/27]
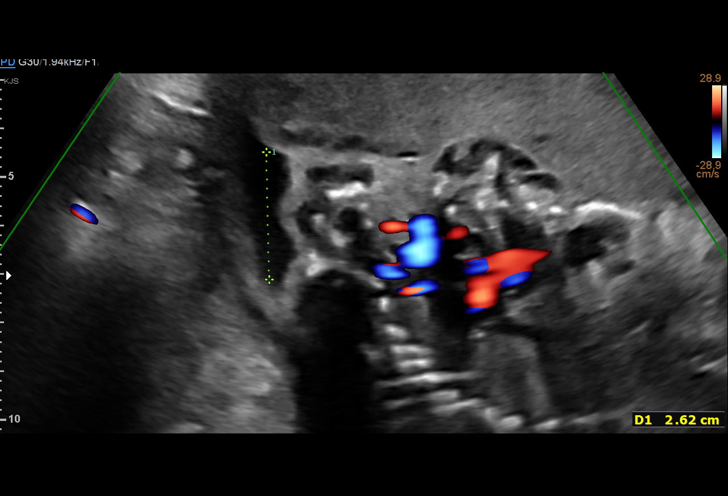
[im 13/27]
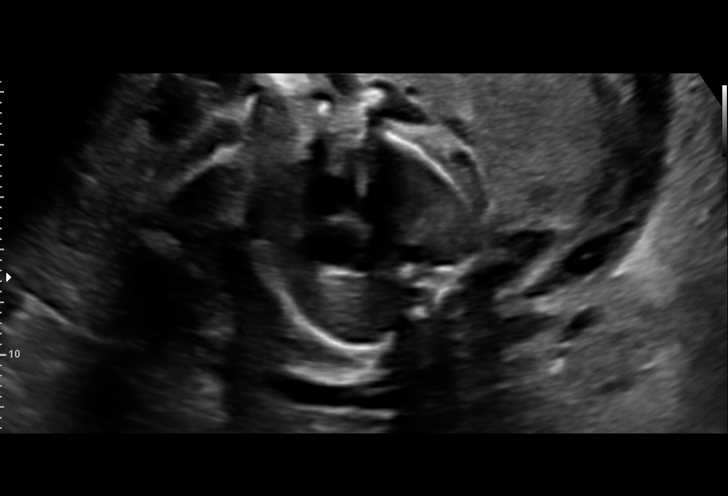
[im 15/27]
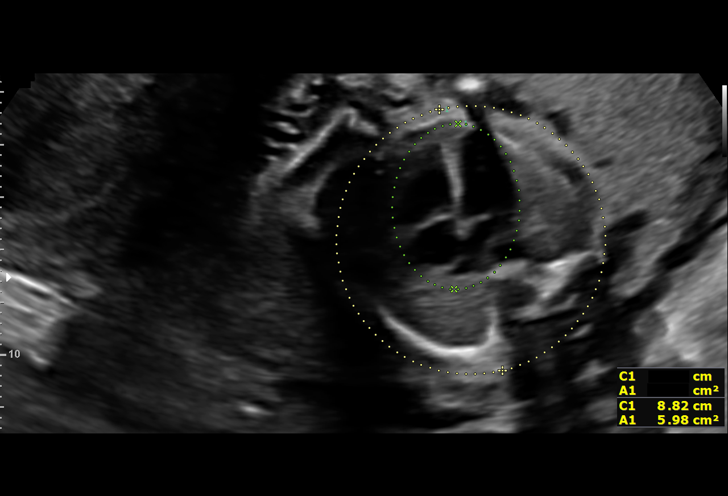
[im 17/27]
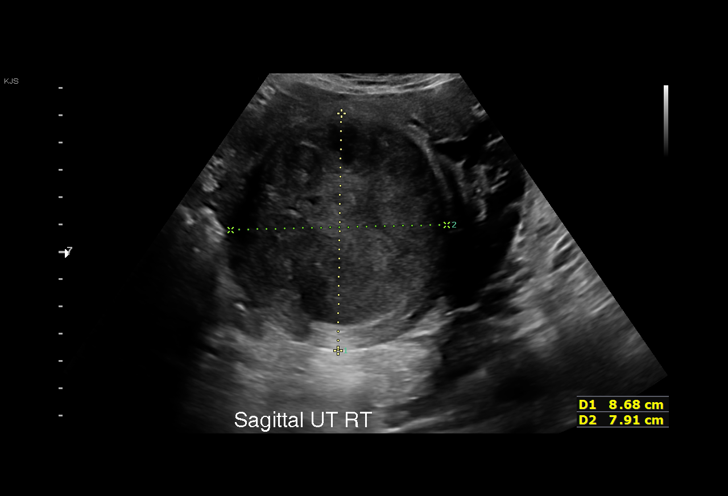
[im 19/27]
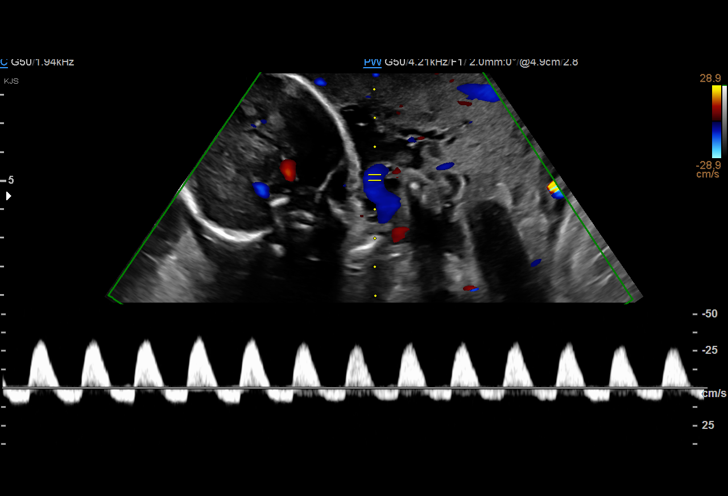
[im 21/27]
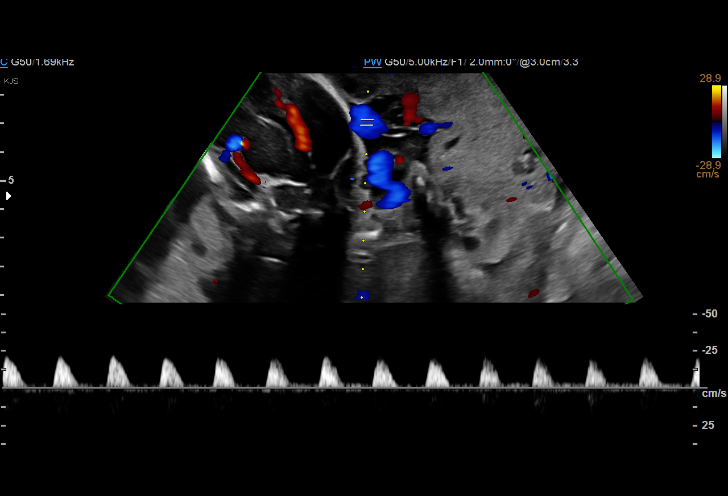
[im 23/27]
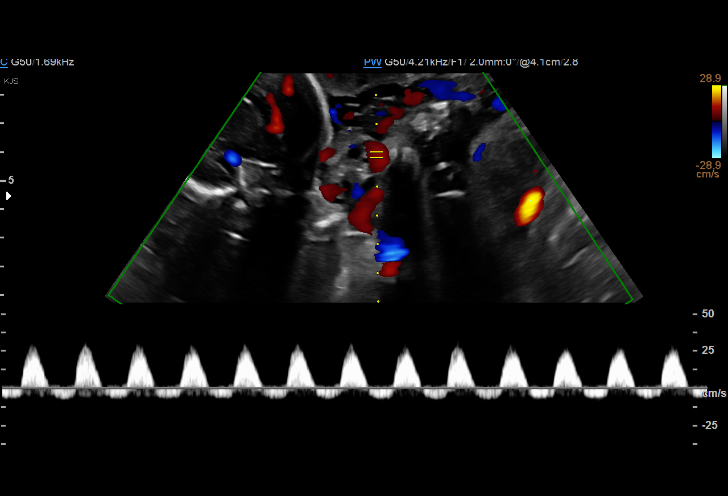
[im 25/27]
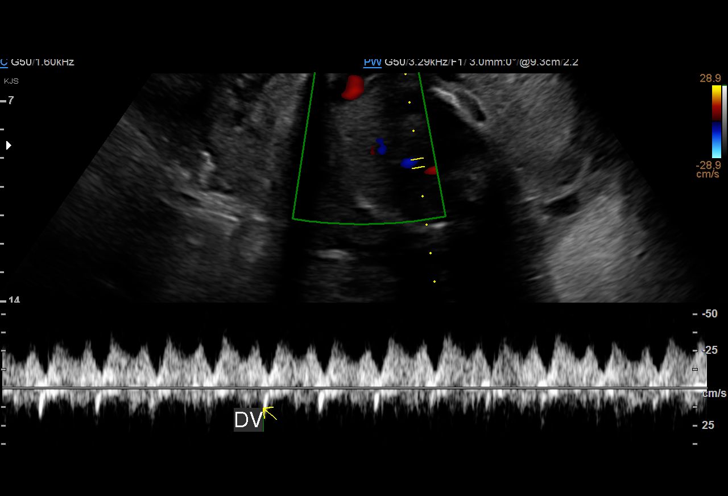
[im 27/27]
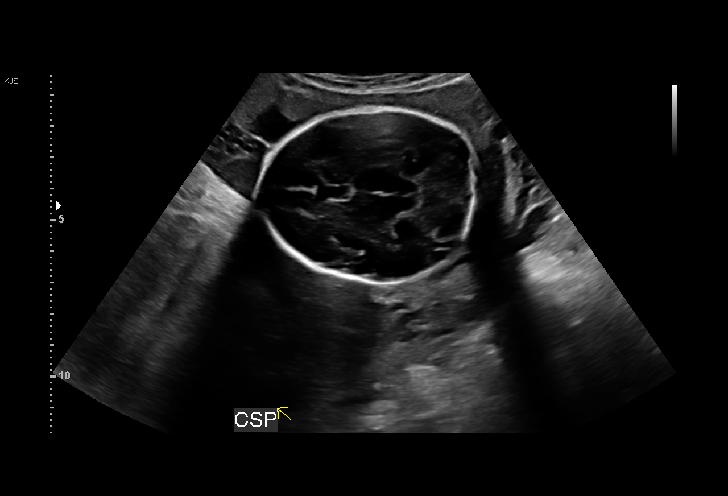

[14 of 27 positions shown; findings below may reference images not displayed]

Obstetrics &
                                                              Gynecology
                                                              [address]
                                                              [place].
                                                              Care

 1   US MFM UA CORD DOPPLER               76820.02     MARIA CARDOSO JALECA
 2   US MFM OB LIMITED                    76815.01     MARIA CARDOSO JALECA

Indications

 Maternal care for known or suspected poor
 fetal growth, second trimester, not applicable
 or unspecified IUGR
 27 weeks gestation of pregnancy
 Advanced maternal age multigravida 35+,
 second trimester
 Obesity complicating pregnancy, second
 trimester (BMI 39)
 Marginal insertion of umbilical cord affecting
 management of mother in second trimester
 Uterine fibroids affecting pregnancy in second  O34.12,
 trimester, antepartum
 Abnormal ultrasound finding on antenatal
 screening of mother (Stomach calcifications)
Fetal Evaluation

 Num Of Fetuses:          1
 Fetal Heart Rate(bpm):   143
 Cardiac Activity:        Observed
 Presentation:            Breech
 Placenta:                Anterior
 Amniotic Fluid
 AFI FV:      Oligohydramnios

                             Largest Pocket(cm)

OB History

 Gravidity:     5         Term:  2          Prem:  0        SAB:   1
 TOP:           1       Ectopic: 0         Living: 2
Gestational Age

 LMP:            27w 2d       Date:  11/01/20                   EDD:  08/08/21
 Best:           27w 2d    Det. By:  LMP  (11/01/20)            EDD:  08/08/21
Anatomy

 Stomach:                Appears normal, left   Bladder:                Appears normal
                         sided
Doppler - Fetal Vessels

 Umbilical Artery
                                                                      RDFV
                                                                        Yes

Cervix Uterus Adnexa

 Cervix
 Not visualized (advanced GA >00wks)

 Uterus
 Single fibroid noted, see table below.

 Adnexa
 No abnormality visualized.
Myomas

 Site                      L(cm)      W(cm)      D(cm)       Location
 Posterior                 8.7        7.9        8.2         Intramural

 Blood Flow                  RI       PI        Comments

Impression

 On today's ultrasound, oligohydramnios is seen and only a
 single maximum vertical pocket measuring 3 cm was seen.
 Breech presentation. No evidence of cardiomegaly or ascites.
 Umbilical artery Doppler showed persistent reversed end-
 diastolic flow. Ductus venosus study showed no absent or
 reversed "a" wave. Fetal bladder is seen.
 See consultation note in [REDACTED].
Recommendations

 -Continue thrice-daily NST.
 -Continue UA Doppler studies on M-W-F.

                 Aujla, Danii

## 2022-02-25 ENCOUNTER — Ambulatory Visit

## 2022-03-01 ENCOUNTER — Ambulatory Visit (INDEPENDENT_AMBULATORY_CARE_PROVIDER_SITE_OTHER)

## 2022-03-01 DIAGNOSIS — L501 Idiopathic urticaria: Secondary | ICD-10-CM | POA: Diagnosis not present

## 2022-03-29 ENCOUNTER — Ambulatory Visit (INDEPENDENT_AMBULATORY_CARE_PROVIDER_SITE_OTHER)

## 2022-03-29 DIAGNOSIS — L501 Idiopathic urticaria: Secondary | ICD-10-CM

## 2022-04-14 ENCOUNTER — Other Ambulatory Visit: Payer: Self-pay | Admitting: Allergy & Immunology

## 2022-04-26 ENCOUNTER — Ambulatory Visit (INDEPENDENT_AMBULATORY_CARE_PROVIDER_SITE_OTHER)

## 2022-04-26 DIAGNOSIS — L501 Idiopathic urticaria: Secondary | ICD-10-CM

## 2022-05-24 ENCOUNTER — Ambulatory Visit (INDEPENDENT_AMBULATORY_CARE_PROVIDER_SITE_OTHER)

## 2022-05-24 DIAGNOSIS — L501 Idiopathic urticaria: Secondary | ICD-10-CM | POA: Diagnosis not present

## 2022-06-21 ENCOUNTER — Ambulatory Visit (INDEPENDENT_AMBULATORY_CARE_PROVIDER_SITE_OTHER): Admitting: *Deleted

## 2022-06-21 DIAGNOSIS — L501 Idiopathic urticaria: Secondary | ICD-10-CM

## 2022-07-03 LAB — OB RESULTS CONSOLE GC/CHLAMYDIA
Chlamydia: NEGATIVE
Neisseria Gonorrhea: NEGATIVE

## 2022-07-15 LAB — OB RESULTS CONSOLE HIV ANTIBODY (ROUTINE TESTING): HIV: NONREACTIVE

## 2022-07-15 LAB — OB RESULTS CONSOLE ANTIBODY SCREEN: Antibody Screen: NEGATIVE

## 2022-07-15 LAB — OB RESULTS CONSOLE RPR: RPR: NONREACTIVE

## 2022-07-15 LAB — OB RESULTS CONSOLE ABO/RH: RH Type: POSITIVE

## 2022-07-15 LAB — OB RESULTS CONSOLE RUBELLA ANTIBODY, IGM: Rubella: IMMUNE

## 2022-07-15 LAB — HEPATITIS C ANTIBODY: HCV Ab: NEGATIVE

## 2022-07-15 LAB — OB RESULTS CONSOLE HEPATITIS B SURFACE ANTIGEN: Hepatitis B Surface Ag: NEGATIVE

## 2022-07-16 ENCOUNTER — Telehealth: Payer: Self-pay | Admitting: Allergy & Immunology

## 2022-07-16 NOTE — Telephone Encounter (Signed)
Called patient and advised we do have patients that take Xolair during pregnancy and there is data she can look at and forward to her OB and advised where to find the data

## 2022-07-16 NOTE — Telephone Encounter (Signed)
Please advise to xolair during pregnancy

## 2022-07-16 NOTE — Telephone Encounter (Signed)
Pt would like a call back concerning her Arvid Right, and taking this medication during pregnancy

## 2022-07-19 ENCOUNTER — Ambulatory Visit (INDEPENDENT_AMBULATORY_CARE_PROVIDER_SITE_OTHER)

## 2022-07-19 ENCOUNTER — Other Ambulatory Visit: Payer: Self-pay | Admitting: Obstetrics and Gynecology

## 2022-07-19 DIAGNOSIS — Z363 Encounter for antenatal screening for malformations: Secondary | ICD-10-CM

## 2022-07-19 DIAGNOSIS — L501 Idiopathic urticaria: Secondary | ICD-10-CM | POA: Diagnosis not present

## 2022-08-16 ENCOUNTER — Ambulatory Visit (INDEPENDENT_AMBULATORY_CARE_PROVIDER_SITE_OTHER)

## 2022-08-16 DIAGNOSIS — L501 Idiopathic urticaria: Secondary | ICD-10-CM | POA: Diagnosis not present

## 2022-08-26 ENCOUNTER — Encounter: Payer: Self-pay | Admitting: *Deleted

## 2022-09-03 ENCOUNTER — Ambulatory Visit: Admitting: *Deleted

## 2022-09-03 ENCOUNTER — Ambulatory Visit (HOSPITAL_BASED_OUTPATIENT_CLINIC_OR_DEPARTMENT_OTHER): Admitting: Obstetrics and Gynecology

## 2022-09-03 ENCOUNTER — Ambulatory Visit: Attending: Obstetrics and Gynecology

## 2022-09-03 VITALS — BP 133/71 | HR 104

## 2022-09-03 DIAGNOSIS — Z3A19 19 weeks gestation of pregnancy: Secondary | ICD-10-CM | POA: Diagnosis present

## 2022-09-03 DIAGNOSIS — O09522 Supervision of elderly multigravida, second trimester: Secondary | ICD-10-CM | POA: Insufficient documentation

## 2022-09-03 DIAGNOSIS — O09892 Supervision of other high risk pregnancies, second trimester: Secondary | ICD-10-CM

## 2022-09-03 DIAGNOSIS — O09292 Supervision of pregnancy with other poor reproductive or obstetric history, second trimester: Secondary | ICD-10-CM | POA: Diagnosis present

## 2022-09-03 DIAGNOSIS — Z363 Encounter for antenatal screening for malformations: Secondary | ICD-10-CM | POA: Diagnosis present

## 2022-09-03 NOTE — Progress Notes (Signed)
Maternal-Fetal Medicine   Name: Morgan Boyd DOB: 09/17/1986 MRN: [id number] Referring Provider: Donnel Saxon, CNM  I had the pleasure of seeing Morgan Boyd today at the [facility].  She was accompanied by her husband. She is G6 P2123 at 19w 1d gestation and is here for fetal anatomy scan and consultation because of her history of severe fetal growth restriction and gestational hypertension in her previous pregnancy.  Obstetric history -10/2010: Term vaginal delivery of a female infant weighing 7 pounds and 4 ounces at birth.  Her daughter is in good health. -01/2014: Term vaginal delivery of a female infant weighing 9 pounds and 12 ounces at birth. -04/2021: Preterm classical cesarean delivery at 25 weeks' gestation of a female infant weighing 530 grams at birth. Her pregnancy was complicated by gestational hypertension and severe fetal growth restriction with abnormal umbilical artery Doppler studies (reversed-end-diastolic flow). Patient was admitted for inpatient management at 24 weeks' gestation.   Subsequently, oligohydramnios and non-reassuring fetal heart trace led to cesarean delivery. She had classical cesarean delivery.  The newborn stayed in the NICU for 243 days before he was discharged in March 2023. He is currently on G-tube, but otherwise doing well.  Gyn history: No history of abnormal Pap smears or cervical surgeries. Past medical history: No history of diabetes or hypertension or any other chronic medical issues.  She has class III obesity. Past surgical history: Tonsillectomy, classical cesarean section, D&C. Medications: Low-dose aspirin, prenatal vitamins, vitamin D, Xolair injections once a month (for urticaria). Allergies: Sulfa drugs (palpitations, sweating). Social history: Denies tobacco or drug or alcohol use.  Her husband is in good health. Family history: No history of venous thromboembolism in the family.  Prenatal course: Patient reports  she did cell-free fetal DNA screening that showed low risk for fetal aneuploidies.  Ultrasound We performed fetal anatomical survey.  Amniotic fluid is normal and good fetal activity seen.  No markers of aneuploidies or fetal structural defects are seen.  Fetal anatomical survey was not completed because of fetal position.  Fetal biometry is consistent with the previously established dates. Placenta is posterior and there is no evidence of previa or placenta accreta spectrum. As maternal obesity imposes limitations on the resolution of ultrasound images, fetal anomalies may be missed.  History of gestational hypertension and severe fetal growth restriction Her poor obstetric history of severe fetal growth restriction was most likely due to hypertension complicating her pregnancy.  I counseled the patient that gestational hypertension/preeclampsia recurs in about 50% of the cases. It can occur earlier because of early-onset hypertension in her previous pregnancy.  I discussed the benefit of low-dose aspirin prophylaxis and delaying or preventing preeclampsia. Based on systematic reviews, the U.S Preventive Services Task Force (USPSTF) recommended low-dose aspirin in pregnancies that are at high-risk of developing preeclampsia ((Ann Intern Med [phone]). Following this recommendation, American College of Obstetricians & Gynecologists (ACOG) endorsed it in the Practice Advisory statement (May 01, 2015).  Patient takes aspirin 81 mg daily.  Additional benefit may be present as patient takes 162 mg (2 tablets) daily.  I recommended that she increase aspirin to 2 tablets daily.  She does not have any side effects from aspirin.  I counseled the patient on ultrasound protocol of monitoring fetal growth every 4 weeks weekly antenatal testing from [redacted] weeks gestation till delivery.  Previous classical cesarean delivery Risk of uterine scar dehiscence is slightly increased with previous classical  cesarean delivery and we recommend repeat cesarean delivery at [redacted]  weeks gestation.  I reassured the patient of placental location and that there is no evidence of previa or placenta accreta spectrum.  Repeat cesarean deliveries increase the likelihood of placenta previa or placenta accreta spectrum. Patient is considering bilateral tubal sterilization.  Alternatively, husband may undergo vasectomy procedure.   Recommendations -An appointment was made for her to return in 4 weeks for fetal growth assessment patient of fetal anatomy. -Fetal growth assessments every 4 weeks. -Weekly BPP from [redacted] weeks gestation till delivery. -Repeat cesarean delivery at [redacted] weeks gestation. -Aspirin 162 mg daily (2 tablets).   Thank you for consultation.  If you have any questions or concerns, please contact me the [facility].  Consultation including face-to-face (more than 50%) counseling 30 minutes.

## 2022-09-04 ENCOUNTER — Other Ambulatory Visit: Payer: Self-pay | Admitting: *Deleted

## 2022-09-04 DIAGNOSIS — Z362 Encounter for other antenatal screening follow-up: Secondary | ICD-10-CM

## 2022-09-04 DIAGNOSIS — O09299 Supervision of pregnancy with other poor reproductive or obstetric history, unspecified trimester: Secondary | ICD-10-CM

## 2022-09-04 DIAGNOSIS — Z8759 Personal history of other complications of pregnancy, childbirth and the puerperium: Secondary | ICD-10-CM

## 2022-09-04 DIAGNOSIS — O99212 Obesity complicating pregnancy, second trimester: Secondary | ICD-10-CM

## 2022-09-04 DIAGNOSIS — O09522 Supervision of elderly multigravida, second trimester: Secondary | ICD-10-CM

## 2022-09-16 ENCOUNTER — Ambulatory Visit (INDEPENDENT_AMBULATORY_CARE_PROVIDER_SITE_OTHER)

## 2022-09-16 DIAGNOSIS — L501 Idiopathic urticaria: Secondary | ICD-10-CM | POA: Diagnosis not present

## 2022-09-17 ENCOUNTER — Other Ambulatory Visit: Payer: Self-pay | Admitting: Obstetrics & Gynecology

## 2022-10-09 ENCOUNTER — Other Ambulatory Visit: Payer: Self-pay | Admitting: *Deleted

## 2022-10-09 ENCOUNTER — Ambulatory Visit: Admitting: *Deleted

## 2022-10-09 ENCOUNTER — Ambulatory Visit: Attending: Obstetrics and Gynecology

## 2022-10-09 VITALS — BP 120/70 | HR 96

## 2022-10-09 DIAGNOSIS — Z362 Encounter for other antenatal screening follow-up: Secondary | ICD-10-CM | POA: Diagnosis not present

## 2022-10-09 DIAGNOSIS — Z8759 Personal history of other complications of pregnancy, childbirth and the puerperium: Secondary | ICD-10-CM | POA: Diagnosis present

## 2022-10-09 DIAGNOSIS — O99212 Obesity complicating pregnancy, second trimester: Secondary | ICD-10-CM

## 2022-10-09 DIAGNOSIS — Z3A24 24 weeks gestation of pregnancy: Secondary | ICD-10-CM

## 2022-10-09 DIAGNOSIS — E669 Obesity, unspecified: Secondary | ICD-10-CM

## 2022-10-09 DIAGNOSIS — O09522 Supervision of elderly multigravida, second trimester: Secondary | ICD-10-CM

## 2022-10-09 DIAGNOSIS — O09299 Supervision of pregnancy with other poor reproductive or obstetric history, unspecified trimester: Secondary | ICD-10-CM | POA: Insufficient documentation

## 2022-10-09 DIAGNOSIS — O34219 Maternal care for unspecified type scar from previous cesarean delivery: Secondary | ICD-10-CM

## 2022-10-09 DIAGNOSIS — O365920 Maternal care for other known or suspected poor fetal growth, second trimester, not applicable or unspecified: Secondary | ICD-10-CM | POA: Diagnosis present

## 2022-10-09 DIAGNOSIS — O09292 Supervision of pregnancy with other poor reproductive or obstetric history, second trimester: Secondary | ICD-10-CM | POA: Diagnosis not present

## 2022-10-09 DIAGNOSIS — D259 Leiomyoma of uterus, unspecified: Secondary | ICD-10-CM

## 2022-10-09 DIAGNOSIS — O09899 Supervision of other high risk pregnancies, unspecified trimester: Secondary | ICD-10-CM

## 2022-10-09 DIAGNOSIS — O09892 Supervision of other high risk pregnancies, second trimester: Secondary | ICD-10-CM

## 2022-10-09 DIAGNOSIS — O09212 Supervision of pregnancy with history of pre-term labor, second trimester: Secondary | ICD-10-CM

## 2022-10-22 ENCOUNTER — Ambulatory Visit (INDEPENDENT_AMBULATORY_CARE_PROVIDER_SITE_OTHER)

## 2022-10-22 DIAGNOSIS — L501 Idiopathic urticaria: Secondary | ICD-10-CM | POA: Diagnosis not present

## 2022-11-06 ENCOUNTER — Other Ambulatory Visit: Payer: Self-pay | Admitting: Obstetrics and Gynecology

## 2022-11-08 ENCOUNTER — Ambulatory Visit: Admitting: *Deleted

## 2022-11-08 ENCOUNTER — Ambulatory Visit: Attending: Maternal & Fetal Medicine

## 2022-11-08 VITALS — BP 121/66 | HR 109

## 2022-11-08 DIAGNOSIS — O09523 Supervision of elderly multigravida, third trimester: Secondary | ICD-10-CM | POA: Insufficient documentation

## 2022-11-08 DIAGNOSIS — O3410 Maternal care for benign tumor of corpus uteri, unspecified trimester: Secondary | ICD-10-CM | POA: Diagnosis present

## 2022-11-08 DIAGNOSIS — O09293 Supervision of pregnancy with other poor reproductive or obstetric history, third trimester: Secondary | ICD-10-CM

## 2022-11-08 DIAGNOSIS — E669 Obesity, unspecified: Secondary | ICD-10-CM

## 2022-11-08 DIAGNOSIS — O34219 Maternal care for unspecified type scar from previous cesarean delivery: Secondary | ICD-10-CM

## 2022-11-08 DIAGNOSIS — Z8759 Personal history of other complications of pregnancy, childbirth and the puerperium: Secondary | ICD-10-CM | POA: Insufficient documentation

## 2022-11-08 DIAGNOSIS — Z791 Long term (current) use of non-steroidal anti-inflammatories (NSAID): Secondary | ICD-10-CM

## 2022-11-08 DIAGNOSIS — O09213 Supervision of pregnancy with history of pre-term labor, third trimester: Secondary | ICD-10-CM

## 2022-11-08 DIAGNOSIS — O09899 Supervision of other high risk pregnancies, unspecified trimester: Secondary | ICD-10-CM | POA: Diagnosis present

## 2022-11-08 DIAGNOSIS — O99212 Obesity complicating pregnancy, second trimester: Secondary | ICD-10-CM | POA: Insufficient documentation

## 2022-11-08 DIAGNOSIS — O99213 Obesity complicating pregnancy, third trimester: Secondary | ICD-10-CM

## 2022-11-08 DIAGNOSIS — Z3A28 28 weeks gestation of pregnancy: Secondary | ICD-10-CM

## 2022-11-08 DIAGNOSIS — O3413 Maternal care for benign tumor of corpus uteri, third trimester: Secondary | ICD-10-CM

## 2022-11-08 DIAGNOSIS — D259 Leiomyoma of uterus, unspecified: Secondary | ICD-10-CM | POA: Diagnosis present

## 2022-11-08 DIAGNOSIS — O09522 Supervision of elderly multigravida, second trimester: Secondary | ICD-10-CM | POA: Diagnosis present

## 2022-11-19 ENCOUNTER — Ambulatory Visit (INDEPENDENT_AMBULATORY_CARE_PROVIDER_SITE_OTHER)

## 2022-11-19 DIAGNOSIS — L501 Idiopathic urticaria: Secondary | ICD-10-CM | POA: Diagnosis not present

## 2022-12-04 ENCOUNTER — Ambulatory Visit: Attending: Maternal & Fetal Medicine

## 2022-12-04 ENCOUNTER — Ambulatory Visit: Admitting: *Deleted

## 2022-12-04 VITALS — BP 110/66 | HR 101

## 2022-12-04 DIAGNOSIS — O34219 Maternal care for unspecified type scar from previous cesarean delivery: Secondary | ICD-10-CM

## 2022-12-04 DIAGNOSIS — Z791 Long term (current) use of non-steroidal anti-inflammatories (NSAID): Secondary | ICD-10-CM

## 2022-12-04 DIAGNOSIS — O3410 Maternal care for benign tumor of corpus uteri, unspecified trimester: Secondary | ICD-10-CM | POA: Diagnosis present

## 2022-12-04 DIAGNOSIS — O09293 Supervision of pregnancy with other poor reproductive or obstetric history, third trimester: Secondary | ICD-10-CM

## 2022-12-04 DIAGNOSIS — O09522 Supervision of elderly multigravida, second trimester: Secondary | ICD-10-CM | POA: Insufficient documentation

## 2022-12-04 DIAGNOSIS — D259 Leiomyoma of uterus, unspecified: Secondary | ICD-10-CM | POA: Diagnosis present

## 2022-12-04 DIAGNOSIS — O3413 Maternal care for benign tumor of corpus uteri, third trimester: Secondary | ICD-10-CM

## 2022-12-04 DIAGNOSIS — O99213 Obesity complicating pregnancy, third trimester: Secondary | ICD-10-CM | POA: Insufficient documentation

## 2022-12-04 DIAGNOSIS — O09899 Supervision of other high risk pregnancies, unspecified trimester: Secondary | ICD-10-CM | POA: Insufficient documentation

## 2022-12-04 DIAGNOSIS — Z8759 Personal history of other complications of pregnancy, childbirth and the puerperium: Secondary | ICD-10-CM | POA: Insufficient documentation

## 2022-12-04 DIAGNOSIS — O09213 Supervision of pregnancy with history of pre-term labor, third trimester: Secondary | ICD-10-CM

## 2022-12-04 DIAGNOSIS — O99212 Obesity complicating pregnancy, second trimester: Secondary | ICD-10-CM | POA: Diagnosis present

## 2022-12-04 DIAGNOSIS — E669 Obesity, unspecified: Secondary | ICD-10-CM

## 2022-12-04 DIAGNOSIS — O09523 Supervision of elderly multigravida, third trimester: Secondary | ICD-10-CM | POA: Diagnosis not present

## 2022-12-04 DIAGNOSIS — Z3A32 32 weeks gestation of pregnancy: Secondary | ICD-10-CM

## 2022-12-17 ENCOUNTER — Ambulatory Visit (INDEPENDENT_AMBULATORY_CARE_PROVIDER_SITE_OTHER)

## 2022-12-17 DIAGNOSIS — L501 Idiopathic urticaria: Secondary | ICD-10-CM | POA: Diagnosis not present

## 2022-12-18 ENCOUNTER — Ambulatory Visit: Admitting: *Deleted

## 2022-12-18 ENCOUNTER — Ambulatory Visit: Attending: Maternal & Fetal Medicine

## 2022-12-18 VITALS — BP 130/81 | HR 103

## 2022-12-18 DIAGNOSIS — O09213 Supervision of pregnancy with history of pre-term labor, third trimester: Secondary | ICD-10-CM

## 2022-12-18 DIAGNOSIS — O3410 Maternal care for benign tumor of corpus uteri, unspecified trimester: Secondary | ICD-10-CM | POA: Insufficient documentation

## 2022-12-18 DIAGNOSIS — O09899 Supervision of other high risk pregnancies, unspecified trimester: Secondary | ICD-10-CM | POA: Insufficient documentation

## 2022-12-18 DIAGNOSIS — O99210 Obesity complicating pregnancy, unspecified trimester: Secondary | ICD-10-CM | POA: Insufficient documentation

## 2022-12-18 DIAGNOSIS — O99213 Obesity complicating pregnancy, third trimester: Secondary | ICD-10-CM

## 2022-12-18 DIAGNOSIS — O09293 Supervision of pregnancy with other poor reproductive or obstetric history, third trimester: Secondary | ICD-10-CM

## 2022-12-18 DIAGNOSIS — O3413 Maternal care for benign tumor of corpus uteri, third trimester: Secondary | ICD-10-CM

## 2022-12-18 DIAGNOSIS — O09523 Supervision of elderly multigravida, third trimester: Secondary | ICD-10-CM

## 2022-12-18 DIAGNOSIS — O09522 Supervision of elderly multigravida, second trimester: Secondary | ICD-10-CM | POA: Insufficient documentation

## 2022-12-18 DIAGNOSIS — O99212 Obesity complicating pregnancy, second trimester: Secondary | ICD-10-CM | POA: Diagnosis present

## 2022-12-18 DIAGNOSIS — Z3A34 34 weeks gestation of pregnancy: Secondary | ICD-10-CM

## 2022-12-18 DIAGNOSIS — D259 Leiomyoma of uterus, unspecified: Secondary | ICD-10-CM | POA: Diagnosis present

## 2022-12-18 DIAGNOSIS — O34219 Maternal care for unspecified type scar from previous cesarean delivery: Secondary | ICD-10-CM | POA: Diagnosis not present

## 2022-12-18 DIAGNOSIS — Z8759 Personal history of other complications of pregnancy, childbirth and the puerperium: Secondary | ICD-10-CM | POA: Insufficient documentation

## 2022-12-18 DIAGNOSIS — E669 Obesity, unspecified: Secondary | ICD-10-CM

## 2022-12-19 ENCOUNTER — Ambulatory Visit

## 2022-12-19 ENCOUNTER — Ambulatory Visit: Attending: Internal Medicine | Admitting: Cardiology

## 2022-12-19 ENCOUNTER — Encounter: Payer: Self-pay | Admitting: Cardiology

## 2022-12-19 VITALS — BP 110/78 | HR 104 | Ht 63.0 in | Wt 259.2 lb

## 2022-12-19 DIAGNOSIS — R002 Palpitations: Secondary | ICD-10-CM

## 2022-12-19 DIAGNOSIS — O0990 Supervision of high risk pregnancy, unspecified, unspecified trimester: Secondary | ICD-10-CM

## 2022-12-19 DIAGNOSIS — O133 Gestational [pregnancy-induced] hypertension without significant proteinuria, third trimester: Secondary | ICD-10-CM | POA: Diagnosis not present

## 2022-12-19 DIAGNOSIS — Z3A34 34 weeks gestation of pregnancy: Secondary | ICD-10-CM

## 2022-12-19 NOTE — Progress Notes (Unsigned)
Enrolled for [employer] to mail a ZIO XT long term holter monitor to the patients address on file.  

## 2022-12-19 NOTE — Progress Notes (Signed)
Cardio-Obstetrics Clinic  New Evaluation  Date:  12/23/2022   ID:  Morgan Boyd, DOB [birth year], MRN [id number]  PCP:  Osborne, Tirrany T, [facility] Cardiologist:  Berniece Salines, DO  Electrophysiologist:  None       Referring MD: Donnel Saxon, CNM   Chief Complaint: " I am having  palpitations"  History of Present Illness:    Morgan Boyd is a 37 y.o. female [id number] who is being seen today for the evaluation of hypertension at the request of Donnel Saxon, [place].    Medical history includes Gestational hypertension, fibroids, obesity and vitamin D deficiency here today to be evaluated for palpitations.   She is currently [redacted] weeks pregnant.   She tells me that she had been experiencing intermittent chest pain discomfort. She describes it as an abrupt onset of intermittent chest discomfort. Comes and goes. Nothing makes it better or worse.    Prior CV Studies Reviewed: The following studies were reviewed today:   Past Medical History:  Diagnosis Date   Allergy    Autoimmune urticaria    Bilateral ovarian cysts    Bronchitis    Fibroid    Gestational hypertension    Vitamin D deficiency     Past Surgical History:  Procedure Laterality Date   CESAREAN SECTION N/A 05/14/2021   Procedure: CESAREAN SECTION;  Surgeon: Waymon Amato, MD;  Location: [facility];  Service: Obstetrics;  Laterality: N/A;   DILATION AND CURETTAGE OF UTERUS  10/21/2008   IUD REMOVAL     TONSILLECTOMY        OB History     Gravida  6   Para  3   Term  2   Preterm  1   AB  2   Living  3      SAB  1   IAB  1   Ectopic      Multiple  0   Live Births  3               Current Medications: Current Meds  Medication Sig   albuterol (VENTOLIN HFA) 108 (90 Base) MCG/ACT inhaler Inhale 2 puffs into the lungs every 6 (six) hours as needed for wheezing or shortness of breath.   aspirin EC 81 MG tablet Take 81 mg by mouth daily. Swallow whole. BID    Calcium Carbonate-Vit D-Min (CALCIUM 1200) 1200-1000 MG-UNIT CHEW    cetirizine (ZYRTEC) 10 MG tablet Take 10 mg by mouth daily as needed for allergies or rhinitis.   CHOLINE BITARTRATE PO Take 500 mg by mouth.   Prenatal Vit-Fe Fumarate-FA (PRENATAL VITAMINS PO) Take 1 tablet by mouth daily.   promethazine (PHENERGAN) 12.5 MG tablet    Vitamin D, Ergocalciferol, (DRISDOL) 1.25 MG (50000 UNIT) CAPS capsule Take by mouth.   XOLAIR 150 MG injection INJECT 300 MG UNDER THE SKIN EVERY 28 DAYS   Current Facility-Administered Medications for the 12/19/22 encounter (Office Visit) with Berniece Salines, DO  Medication   omalizumab Arvid Right) injection 300 mg     Allergies:   Latex, Sodium bicarbonate, and Sulfa antibiotics   Social History   Socioeconomic History   Marital status: Married    Spouse name: Not on file   Number of children: 3   Years of education: Not on file   Highest education level: Not on file  Occupational History   Occupation: Environmental consultant  Tobacco Use   Smoking status: Never   Smokeless tobacco: Never  Vaping Use   Vaping Use: Never used  Substance and Sexual Activity   Alcohol use: Not Currently    Comment: occ   Drug use: No   Sexual activity: Yes  Other Topics Concern   Not on file  Social History Narrative   Not on file   Social Determinants of Health   Financial Resource Strain: Not on file  Food Insecurity: Food Insecurity Present (09/04/2021)   Hunger Vital Sign    Worried About Running Out of Food in the Last Year: Sometimes true    Ran Out of Food in the Last Year: Never true  Transportation Needs: Not on file  Physical Activity: Not on file  Stress: Not on file  Social Connections: Not on file      Family History  Problem Relation Age of Onset   Cancer Mother    Hypertension Father    Autoimmune disease Father        IGG4   Stroke Maternal Aunt    Cancer Paternal Aunt    Prostate cancer Maternal Grandfather    Stroke Other     Cancer Other       ROS:   Please see the history of present illness.    palpitations All other systems reviewed and are negative.   Labs/EKG Reviewed:    EKG:   EKG is was not ordered today.  The ekg ordered today demonstrates   Recent Labs: No results found for requested labs within last 365 days.   Recent Lipid Panel No results found for: "CHOL", "TRIG", "HDL", "CHOLHDL", "LDLCALC", "LDLDIRECT"  Physical Exam:    VS:  BP 110/78   Pulse (!) 104   Ht '5\' 3"'$  (1.6 m)   Wt 117.6 kg   LMP 04/22/2022   SpO2 98%   BMI 45.92 kg/m     Wt Readings from Last 3 Encounters:  12/19/22 117.6 kg  09/12/21 96.5 kg  04/20/21 102.1 kg     GEN:  Well nourished, well developed in no acute distress HEENT: Normal NECK: No JVD; No carotid bruits LYMPHATICS: No lymphadenopathy CARDIAC: RRR, no murmurs, rubs, gallops RESPIRATORY:  Clear to auscultation without rales, wheezing or rhonchi  ABDOMEN: Soft, non-tender, non-distended MUSCULOSKELETAL:  No edema; No deformity  SKIN: Warm and dry NEUROLOGIC:  Alert and oriented x 3 PSYCHIATRIC:  Normal affect    Risk Assessment/Risk Calculators:     CARPREG II Risk Prediction Index Score:  1.  The patient's risk for a primary cardiac event is 5%.            ASSESSMENT & PLAN:    Palpitations  She will benefit from wearing the ambulatory monitor.  Ask the patient to take her blood pressure daily and educated to notify my office if blood pressure is >140/90 mmHg.    Patient Instructions  Medication Instructions:  Your physician recommends that you continue on your current medications as directed. Please refer to the Current Medication list given to you today.   Please take your blood pressure daily for 2 weeks and send in a MyChart message. Please include heart rates.   HOW TO TAKE YOUR BLOOD PRESSURE: Rest 5 minutes before taking your blood pressure. Don't smoke or drink caffeinated beverages for at least 30 minutes  before. Take your blood pressure before (not after) you eat. Sit comfortably with your back supported and both feet on the floor (don't cross your legs). Elevate your arm to heart level on a table or a desk. Use  the proper sized cuff. It should fit smoothly and snugly around your bare upper arm. There should be enough room to slip a fingertip under the cuff. The bottom edge of the cuff should be 1 inch above the crease of the elbow. Ideally, take 3 measurements at one sitting and record the average.  *If you need a refill on your cardiac medications before your next appointment, please call your pharmacy*   Lab Work: None   Testing/Procedures: [facility] Monitor Instructions  Your physician has requested you wear a ZIO patch monitor for 7 days.  This is a single patch monitor. [employer] supplies one patch monitor per enrollment. Additional stickers are not available. Please do not apply patch if you will be having a Nuclear Stress Test,  Echocardiogram, Cardiac CT, MRI, or Chest Xray during the period you would be wearing the  monitor. The patch cannot be worn during these tests. You cannot remove and re-apply the  ZIO XT patch monitor.  Your ZIO patch monitor will be mailed 3 day USPS to your address on file. It may take 3-5 days  to receive your monitor after you have been enrolled.  Once you have received your monitor, please review the enclosed instructions. Your monitor  has already been registered assigning a specific monitor serial # to you.  Billing and Patient Assistance Program Information  We have supplied [employer] with any of your insurance information on file for billing purposes. [employer] offers a sliding scale Patient Assistance Program for patients that do not have  insurance, or whose insurance does not completely cover the cost of the ZIO monitor.  You must apply for the Patient Assistance Program to qualify for this discounted rate.  To apply, please call  [employer] at [phone], select option 4, select option 2, ask to apply for  Patient Assistance Program. Theodore Demark will ask your household income, and how many people  are in your household. They will quote your out-of-pocket cost based on that information.  [employer] will also be able to set up a 66-month interest-free payment plan if needed.  Applying the monitor   Shave hair from upper left chest.  Hold abrader disc by orange tab. Rub abrader in 40 strokes over the upper left chest as  indicated in your monitor instructions.  Clean area with 4 enclosed alcohol pads. Let dry.  Apply patch as indicated in monitor instructions. Patch will be placed under collarbone on left  side of chest with arrow pointing upward.  Rub patch adhesive wings for 2 minutes. Remove white label marked "1". Remove the white  label marked "2". Rub patch adhesive wings for 2 additional minutes.  While looking in a mirror, press and release button in center of patch. A small green light will  flash 3-4 times. This will be your only indicator that the monitor has been turned on.  Do not shower for the first 24 hours. You may shower after the first 24 hours.  Press the button if you feel a symptom. You will hear a small click. Record Date, Time and  Symptom in the Patient Logbook.  When you are ready to remove the patch, follow instructions on the last 2 pages of Patient  Logbook. Stick patch monitor onto the last page of Patient Logbook.  Place Patient Logbook in the blue and white box. Use locking tab on box and tape box closed  securely. The blue and white box has prepaid postage on it. Please place it in  the mailbox as  soon as possible. Your physician should have your test results approximately 7 days after the  monitor has been mailed back to [facility].  Call [facility] at [phone] if you have questions regarding  your ZIO XT patch monitor. Call them immediately if you see an orange  light blinking on your  monitor.  If your monitor falls off in less than 4 days, contact our Monitor department at [phone].  If your monitor becomes loose or falls off after 4 days call [employer] at [phone] for  suggestions on securing your monitor    Follow-Up: At [facility], you and your health needs are our priority.  As part of our continuing mission to provide you with exceptional heart care, we have created designated Provider Care Teams.  These Care Teams include your primary Cardiologist (physician) and Advanced Practice Providers (APPs -  Physician Assistants and Nurse Practitioners) who all work together to provide you with the care you need, when you need it.  We recommend signing up for the patient portal called "MyChart".  Sign up information is provided on this After Visit Summary.  MyChart is used to connect with patients for Virtual Visits (Telemedicine).  Patients are able to view lab/test results, encounter notes, upcoming appointments, etc.  Non-urgent messages can be sent to your provider as well.   To learn more about what you can do with MyChart, go to [email or web address].    Your next appointment:   20 week(s) via MyChart  Provider:   Berniece Salines, DO     Other Instructions [email or web address] [email or web address]        FDA-cleared, clinical grade mobile EKG monitor: Jodelle Red is the most clinically-validated mobile EKG used by the world's leading cardiac care medical professionals With Basic service, know instantly if your heart rhythm is normal or if atrial fibrillation is detected, and email the last single EKG recording to yourself or your doctor Premium service, available for purchase through the K[employer]ia app for $9.99 per month or $99 per year, includes unlimited history and storage of your EKG recordings, a monthly EKG summary report to share with your doctor, along with the ability to track your blood pressure,  activity and weight Includes one KardiaMobile phone clip FREE SHIPPING: Standard delivery 1-3 business days. Orders placed by 11:00am PST will ship that afternoon. Otherwise, will ship next business day. All orders ship via [employer] from [place], [place] - sending an EKG Download app and set up profile. Run EKG - by placing 1-2 fingers on the silver plates After EKG is complete - Download PDF  - Skip password (if you apply a password the provider will need it to view the EKG) Click share button (square with upward arrow) in bottom left corner To send: choose MyChart (first time log into MyChart)  Pop up window about sending ECG Click continue Choose type of message Choose provider Type subject and message Click send (EKG should be attached)  - To send additional EKGs in one message click the paperclip image and bottom of page to attach.       Dispo:  No follow-ups on file.   Medication Adjustments/Labs and Tests Ordered: Current medicines are reviewed at length with the patient today.  Concerns regarding medicines are outlined above.  Tests Ordered: Orders Placed This Encounter  Procedures   LONG TERM MONITOR (3-14 DAYS)   Medication Changes: No orders of the defined types were placed in this  encounter.

## 2022-12-19 NOTE — Patient Instructions (Signed)
Medication Instructions:  Your physician recommends that you continue on your current medications as directed. Please refer to the Current Medication list given to you today.   Please take your blood pressure daily for 2 weeks and send in a MyChart message. Please include heart rates.   HOW TO TAKE YOUR BLOOD PRESSURE: Rest 5 minutes before taking your blood pressure. Don't smoke or drink caffeinated beverages for at least 30 minutes before. Take your blood pressure before (not after) you eat. Sit comfortably with your back supported and both feet on the floor (don't cross your legs). Elevate your arm to heart level on a table or a desk. Use the proper sized cuff. It should fit smoothly and snugly around your bare upper arm. There should be enough room to slip a fingertip under the cuff. The bottom edge of the cuff should be 1 inch above the crease of the elbow. Ideally, take 3 measurements at one sitting and record the average.  *If you need a refill on your cardiac medications before your next appointment, please call your pharmacy*   Lab Work: None   Testing/Procedures: [place] Monitor Instructions  Your physician has requested you wear a ZIO patch monitor for 7 days.  This is a single patch monitor. [employer] supplies one patch monitor per enrollment. Additional stickers are not available. Please do not apply patch if you will be having a Nuclear Stress Test,  Echocardiogram, Cardiac CT, MRI, or Chest Xray during the period you would be wearing the  monitor. The patch cannot be worn during these tests. You cannot remove and re-apply the  ZIO XT patch monitor.  Your ZIO patch monitor will be mailed 3 day USPS to your address on file. It may take 3-5 days  to receive your monitor after you have been enrolled.  Once you have received your monitor, please review the enclosed instructions. Your monitor  has already been registered assigning a specific monitor serial # to  you.  Billing and Patient Assistance Program Information  We have supplied [employer] with any of your insurance information on file for billing purposes. [employer] offers a sliding scale Patient Assistance Program for patients that do not have  insurance, or whose insurance does not completely cover the cost of the ZIO monitor.  You must apply for the Patient Assistance Program to qualify for this discounted rate.  To apply, please call [employer] at [phone], select option 4, select option 2, ask to apply for  Patient Assistance Program. Theodore Demark will ask your household income, and how many people  are in your household. They will quote your out-of-pocket cost based on that information.  [employer] will also be able to set up a 27-month interest-free payment plan if needed.  Applying the monitor   Shave hair from upper left chest.  Hold abrader disc by orange tab. Rub abrader in 40 strokes over the upper left chest as  indicated in your monitor instructions.  Clean area with 4 enclosed alcohol pads. Let dry.  Apply patch as indicated in monitor instructions. Patch will be placed under collarbone on left  side of chest with arrow pointing upward.  Rub patch adhesive wings for 2 minutes. Remove white label marked "1". Remove the white  label marked "2". Rub patch adhesive wings for 2 additional minutes.  While looking in a mirror, press and release button in center of patch. A small green light will  flash 3-4 times. This will be your only indicator that  the monitor has been turned on.  Do not shower for the first 24 hours. You may shower after the first 24 hours.  Press the button if you feel a symptom. You will hear a small click. Record Date, Time and  Symptom in the Patient Logbook.  When you are ready to remove the patch, follow instructions on the last 2 pages of Patient  Logbook. Stick patch monitor onto the last page of Patient Logbook.  Place Patient Logbook in the blue and white box.  Use locking tab on box and tape box closed  securely. The blue and white box has prepaid postage on it. Please place it in the mailbox as  soon as possible. Your physician should have your test results approximately 7 days after the  monitor has been mailed back to [facility].  Call [place] at [phone] if you have questions regarding  your ZIO XT patch monitor. Call them immediately if you see an orange light blinking on your  monitor.  If your monitor falls off in less than 4 days, contact our Monitor department at [phone].  If your monitor becomes loose or falls off after 4 days call [employer] at [phone] for  suggestions on securing your monitor    Follow-Up: At [facility], you and your health needs are our priority.  As part of our continuing mission to provide you with exceptional heart care, we have created designated Provider Care Teams.  These Care Teams include your primary Cardiologist (physician) and Advanced Practice Providers (APPs -  Physician Assistants and Nurse Practitioners) who all work together to provide you with the care you need, when you need it.  We recommend signing up for the patient portal called "MyChart".  Sign up information is provided on this After Visit Summary.  MyChart is used to connect with patients for Virtual Visits (Telemedicine).  Patients are able to view lab/test results, encounter notes, upcoming appointments, etc.  Non-urgent messages can be sent to your provider as well.   To learn more about what you can do with MyChart, go to [email or web address].    Your next appointment:   20 week(s) via MyChart  Provider:   Berniece Salines, DO     Other Instructions [email or web address]        FDA-cleared, clinical grade mobile EKG monitor: Jodelle Red is the most clinically-validated mobile EKG used by the world's leading cardiac care medical professionals With  Basic service, know instantly if your heart rhythm is normal or if atrial fibrillation is detected, and email the last single EKG recording to yourself or your doctor Premium service, available for purchase through the Kardia app for $9.99 per month or $99 per year, includes unlimited history and storage of your EKG recordings, a monthly EKG summary report to share with your doctor, along with the ability to track your blood pressure, activity and weight Includes one KardiaMobile phone clip FREE SHIPPING: Standard delivery 1-3 business days. Orders placed by 11:00am PST will ship that afternoon. Otherwise, will ship next business day. All orders ship via [employer] from [place], [place] - sending an EKG Download app and set up profile. Run EKG - by placing 1-2 fingers on the silver plates After EKG is complete - Download PDF  - Skip password (if you apply a password the provider will need it to view the EKG) Click share button (square with upward arrow) in bottom left corner To send: choose MyChart (first time  log into MyChart)  Pop up window about sending ECG Click continue Choose type of message Choose provider Type subject and message Click send (EKG should be attached)  - To send additional EKGs in one message click the paperclip image and bottom of page to attach.

## 2022-12-20 ENCOUNTER — Ambulatory Visit: Admitting: Internal Medicine

## 2022-12-23 ENCOUNTER — Encounter (HOSPITAL_COMMUNITY): Payer: Self-pay

## 2022-12-23 DIAGNOSIS — Z8759 Personal history of other complications of pregnancy, childbirth and the puerperium: Secondary | ICD-10-CM

## 2022-12-23 DIAGNOSIS — O99210 Obesity complicating pregnancy, unspecified trimester: Secondary | ICD-10-CM | POA: Insufficient documentation

## 2022-12-23 DIAGNOSIS — R002 Palpitations: Secondary | ICD-10-CM | POA: Diagnosis present

## 2022-12-23 NOTE — H&P (Signed)
Morgan Boyd is a 37 y.o. female, [id number] at 38 weeks, presenting for scheduled repeat cesarean birth on 01/06/23, with tubal sterilization and removal of keloid scar from prior C/S.  Hx previous classical cesarean, with MFM recommending delivery at 37 weeks.  Patient and husband want him to announce baby's gender at time of delivery.  Patient Active Problem List   Diagnosis Date Noted   History of prior pregnancy with IUGR newborn 12/23/2022   History of severe pre-eclampsia 12/23/2022   Palpitations 12/23/2022   Maternal obesity affecting pregnancy, antepartum--BMI 46 12/23/2022   Delivery by classical cesarean section 05/14/2021   Fibroids 04/23/2021   Vitamin D deficiency 04/23/2021   AMA (advanced maternal age) multigravida 35+ 04/20/2021    History of present pregnancy: Patient entered care at 9 3/7 weeks.   EDC of 01/27/23 was established by LMP and c/w [place] at 9 3/7 weeks. [place] at 9 3/7 weeks--SIUP, 9 3/7 weeks, c/w LMP dating, multiple fibroids noted--right IM calcified, 2.43 x 1.86 x 2.23; right posterior subserosal 3.49 x 3.71 x 3.44; posterior mid-subserosal 1.81 x 0.97 x 1.562, no [place]. Anatomy scan:  19 1/7 weeks,and a posterior/fundal placenta, anatomy incomplete, no evidence accreta/percreta, cervix 4.06.   Additional [place] evaluations at MFM: 24 weeks--EFW 1+8, 36%ile, vtx, cervix 6.2, posterior/fundal placenta.  28 weeks--vtx, posterior/fundal placenta, AFI 14.15, EFW 2+14, 40%ile, right fibroid 4.8 x 2.8 x 2.9.   32 weeks--vtx, posterior/fundal placenta, AFI 16.5, 60%ile, EFW 4+7, 50%ile.   33 weeks--vtx, posterior/fundal placenta, AFI 16.04, 58%ile, BPP 8/8.  34 weeks--posterior/fundal placenta, normal AFI, BPP 8/8. 35 weeks--vtx, posterior/fundal placenta, AFI 12.89. 42%ile, BPP 8/8. 36 weeks--vtx, posterior/fundal placenta, AFI 19.86, 75%ile, BPP 8/8 Significant prenatal events:  Entered care at 9 3/7 weeks.  Wanted repeat C/S, with removal of keloid from prior scan,  considering vasectomy vs tubal. PIH labs/PCR WNL at NOB, recommended ASA 81 mg.   Genetics WNL, referred to MFM for [place] due to hx severe IUGR/pre-eclampsia, AMA, fibroids.  Elected to plan bilateral salpingectomy with C/S.  Was treated by allergist with Xolair every month for urticaria with benefit.  Noted occurrence of palpitations around 32 weeks, referred to [place] [facility], had visit 12/19/22--plan for Zio monitoring x 7 days.  Scheduled for repeat C/S with bilateral salpingectomy at 37 weeks per MFM recommendation.  Followed for antenatal testing from 34 weeks due to BMI. Last evaluation:  Last visit 01/01/23--VSS, FHR 152, weight 262.  OB History     Gravida  6   Para  3   Term  2   Preterm  1   AB  2   Living  3      SAB  1   IAB  1   Ectopic      Multiple  0   Live Births  3         2005--TAB 2010--SAB 2012--SVB, 40 3/7 weeks, female, 7+14 2015--SVB, 41 1/7 weeks, female, 9+12 2022--Primary classical C/S, 37 weeks, due to breech, severe IUGR and pre-eclampsia, female, 1+2 lbs at [place].  Baby had multiple surgeries due to [place], feeding tube, in [place] ICU for 8 months, still has G tube, very small, but strong!  Past Medical History:  Diagnosis Date   Allergy    Autoimmune urticaria    Bilateral ovarian cysts    Bronchitis    Fibroid    Gestational hypertension    Vitamin D deficiency    Past Surgical History:  Procedure Laterality Date   CESAREAN SECTION  N/A 05/14/2021   Procedure: CESAREAN SECTION;  Surgeon: Waymon Amato, MD;  Location: [place];  Service: Obstetrics;  Laterality: N/A;   DILATION AND CURETTAGE OF UTERUS  10/21/2008   IUD REMOVAL     TONSILLECTOMY     Family History: family history includes Autoimmune disease in her father; Cancer in her mother, paternal aunt, and another family member; Hypertension in her father; Prostate cancer in her maternal grandfather; Stroke in her maternal aunt and another family member.  Social History:  reports that she has  never smoked. She has never used smokeless tobacco. She reports that she does not currently use alcohol. She reports that she does not use drugs.  Patient is Black, married to [place], who is involved and supportive. Patient is employed with [employer].   Prenatal Transfer Tool  Maternal Diabetes: No Genetic Screening: Normal Panorama, Horizon, and AFP. Maternal Ultrasounds/Referrals: Normal Fetal Ultrasounds or other Referrals:  None Maternal Substance Abuse:  No Significant Maternal Medications:  Meds include: Other: ASA 81 mg Significant Maternal Lab Results: Other: GBS pending from 3/13  TDAP NA Flu 07/2022  ROS:  Denies leaking, bleeding, reports occasional palpitations, denies chest pain, reports frequent FM, denies UCs.  Allergies  Allergen Reactions   Latex Itching and Rash   Sodium Bicarbonate Dermatitis, Itching and Rash   Sulfa Antibiotics Palpitations    Faint, dizzy, shaking       Last menstrual period 04/22/2022, unknown if currently breastfeeding.  Chest clear Heart RRR without murmur Abd gravid, NT, FH 39 week size Keloid scar noted at prior C/S site. Pelvic:Deferred Ext: WNL  FHR: 150 at last office visit UCs:  Occasional, mild.  Prenatal labs: ABO, Rh: A/Positive/-- (09/25 0000) Antibody: Negative (09/25 0000) Rubella:  Immune (09/25 0000) RPR: Nonreactive (09/25 0000)  HBsAg: Negative (09/25 0000)  HIV: Non-reactive (09/25 0000)  GBS:  Pending from 01/01/23--will be updated upon results. Sickle cell/Hgb electrophoresis:  AA Pap:  2021 WNL GC:  Neg 07/03/22 Chlamydia:  07/03/22 Genetic screenings: Panorama low risk, Horizon neg, AFP neg. Glucola:  Elevated 1 hour, normal 3 hour Other:   Hgb 12.5 at NOB, 12.5 at 28 weeks Low vit D noted at NOB. PIH labs and PCR WNL at NOB w/u.    Assessment/Plan: IUP at 37 weeks Prior classical cesarean--desires repeat C/S, with tubal sterilization, with removal of keloid scar. BMI 46 Hx  palpitations--recent Zio monitoring, results pending. Hx severe pre-eclampsia and IUGR 2022, with preterm delivery at 27 weeks Fibroid uterus AMA Hx LGA infant in past pregnancy Hx adverse rxn to sodium bicarb Latex allergy Hx diastasis recti  Plan: [facility] consult with Dr. Alesia Richards for repeat cesarean and tubal sterilization, removal of keloid scar. Routine [facility] pre-op orders FOB to announce baby gender at time of delivery.  Donnel Saxon, CNM, [place] 12/23/2022, 5:46 PM

## 2022-12-23 NOTE — Patient Instructions (Signed)
Morgan Boyd  12/23/2022   Your procedure is scheduled on:  01/06/2023  Arrive at 1:00 PM at Entrance C on [address] at [facility]  and [address]. You are invited to use the FREE valet parking or use the Visitor's parking deck.  Pick up the phone at the desk and dial [phone].  Call this number if you have problems the morning of surgery: [phone]  Remember:   Do not eat food:(After Midnight) Desps de medianoche.  Do not drink clear liquids: (4 Hours before arrival) 4 horas ante llegada.  Take these medicines the morning of surgery with A SIP OF WATER:  Bring your inhaler with you   Do not wear jewelry, make-up or nail polish.  Do not wear lotions, powders, or perfumes. Do not wear deodorant.  Do not shave 48 hours prior to surgery.  Do not bring valuables to the hospital.  [facility] is not   responsible for any belongings or valuables brought to the hospital.  Contacts, dentures or bridgework may not be worn into surgery.  Leave suitcase in the car. After surgery it may be brought to your room.  For patients admitted to the hospital, checkout time is 11:00 AM the day of              discharge.      Please read over the following fact sheets that you were given:     Preparing for Surgery

## 2022-12-25 ENCOUNTER — Ambulatory Visit: Admitting: *Deleted

## 2022-12-25 ENCOUNTER — Ambulatory Visit: Attending: Maternal & Fetal Medicine

## 2022-12-25 VITALS — BP 129/63 | HR 100

## 2022-12-25 DIAGNOSIS — O09522 Supervision of elderly multigravida, second trimester: Secondary | ICD-10-CM | POA: Diagnosis present

## 2022-12-25 DIAGNOSIS — O34212 Maternal care for vertical scar from previous cesarean delivery: Secondary | ICD-10-CM | POA: Diagnosis not present

## 2022-12-25 DIAGNOSIS — O3410 Maternal care for benign tumor of corpus uteri, unspecified trimester: Secondary | ICD-10-CM | POA: Diagnosis present

## 2022-12-25 DIAGNOSIS — O99213 Obesity complicating pregnancy, third trimester: Secondary | ICD-10-CM

## 2022-12-25 DIAGNOSIS — E669 Obesity, unspecified: Secondary | ICD-10-CM

## 2022-12-25 DIAGNOSIS — O09293 Supervision of pregnancy with other poor reproductive or obstetric history, third trimester: Secondary | ICD-10-CM | POA: Diagnosis not present

## 2022-12-25 DIAGNOSIS — O09899 Supervision of other high risk pregnancies, unspecified trimester: Secondary | ICD-10-CM | POA: Insufficient documentation

## 2022-12-25 DIAGNOSIS — Z3A35 35 weeks gestation of pregnancy: Secondary | ICD-10-CM

## 2022-12-25 DIAGNOSIS — D259 Leiomyoma of uterus, unspecified: Secondary | ICD-10-CM | POA: Insufficient documentation

## 2022-12-25 DIAGNOSIS — Z8759 Personal history of other complications of pregnancy, childbirth and the puerperium: Secondary | ICD-10-CM | POA: Insufficient documentation

## 2022-12-25 DIAGNOSIS — O09523 Supervision of elderly multigravida, third trimester: Secondary | ICD-10-CM

## 2022-12-25 DIAGNOSIS — O09213 Supervision of pregnancy with history of pre-term labor, third trimester: Secondary | ICD-10-CM | POA: Diagnosis not present

## 2022-12-25 DIAGNOSIS — O3413 Maternal care for benign tumor of corpus uteri, third trimester: Secondary | ICD-10-CM

## 2022-12-25 DIAGNOSIS — O99212 Obesity complicating pregnancy, second trimester: Secondary | ICD-10-CM | POA: Insufficient documentation

## 2023-01-01 ENCOUNTER — Ambulatory Visit: Admitting: *Deleted

## 2023-01-01 ENCOUNTER — Ambulatory Visit: Attending: Maternal & Fetal Medicine

## 2023-01-01 ENCOUNTER — Encounter: Payer: Self-pay | Admitting: Obstetrics and Gynecology

## 2023-01-01 VITALS — BP 131/68 | HR 105

## 2023-01-01 DIAGNOSIS — O34212 Maternal care for vertical scar from previous cesarean delivery: Secondary | ICD-10-CM | POA: Diagnosis not present

## 2023-01-01 DIAGNOSIS — O99212 Obesity complicating pregnancy, second trimester: Secondary | ICD-10-CM | POA: Insufficient documentation

## 2023-01-01 DIAGNOSIS — D259 Leiomyoma of uterus, unspecified: Secondary | ICD-10-CM | POA: Diagnosis present

## 2023-01-01 DIAGNOSIS — O09523 Supervision of elderly multigravida, third trimester: Secondary | ICD-10-CM | POA: Insufficient documentation

## 2023-01-01 DIAGNOSIS — O09213 Supervision of pregnancy with history of pre-term labor, third trimester: Secondary | ICD-10-CM

## 2023-01-01 DIAGNOSIS — O3410 Maternal care for benign tumor of corpus uteri, unspecified trimester: Secondary | ICD-10-CM | POA: Diagnosis present

## 2023-01-01 DIAGNOSIS — Z8759 Personal history of other complications of pregnancy, childbirth and the puerperium: Secondary | ICD-10-CM | POA: Insufficient documentation

## 2023-01-01 DIAGNOSIS — O09522 Supervision of elderly multigravida, second trimester: Secondary | ICD-10-CM | POA: Diagnosis present

## 2023-01-01 DIAGNOSIS — O09293 Supervision of pregnancy with other poor reproductive or obstetric history, third trimester: Secondary | ICD-10-CM | POA: Diagnosis not present

## 2023-01-01 DIAGNOSIS — O99213 Obesity complicating pregnancy, third trimester: Secondary | ICD-10-CM

## 2023-01-01 DIAGNOSIS — Z3A36 36 weeks gestation of pregnancy: Secondary | ICD-10-CM

## 2023-01-01 DIAGNOSIS — O09899 Supervision of other high risk pregnancies, unspecified trimester: Secondary | ICD-10-CM | POA: Insufficient documentation

## 2023-01-01 DIAGNOSIS — E669 Obesity, unspecified: Secondary | ICD-10-CM

## 2023-01-01 DIAGNOSIS — O3413 Maternal care for benign tumor of corpus uteri, third trimester: Secondary | ICD-10-CM

## 2023-01-03 ENCOUNTER — Encounter (HOSPITAL_COMMUNITY)
Admission: RE | Admit: 2023-01-03 | Discharge: 2023-01-03 | Disposition: A | Discharge status: Home / Self Care | Source: Ambulatory Visit | Attending: Family Medicine | Admitting: Family Medicine

## 2023-01-03 VITALS — Ht 63.0 in | Wt 255.0 lb

## 2023-01-03 DIAGNOSIS — Z01812 Encounter for preprocedural laboratory examination: Secondary | ICD-10-CM | POA: Insufficient documentation

## 2023-01-03 DIAGNOSIS — Z8759 Personal history of other complications of pregnancy, childbirth and the puerperium: Secondary | ICD-10-CM | POA: Insufficient documentation

## 2023-01-03 HISTORY — DX: Other urticaria: L50.8

## 2023-01-03 LAB — TYPE AND SCREEN
ABO/RH(D): A POS
Antibody Screen: NEGATIVE

## 2023-01-03 LAB — CBC
HCT: 36.3 % (ref 36.0–46.0)
Hemoglobin: 12.3 g/dL (ref 12.0–15.0)
MCH: 29.1 pg (ref 26.0–34.0)
MCHC: 33.9 g/dL (ref 30.0–36.0)
MCV: 85.8 fL (ref 80.0–100.0)
Platelets: 226 10*3/uL (ref 150–400)
RBC: 4.23 MIL/uL (ref 3.87–5.11)
RDW: 14.3 % (ref 11.5–15.5)
WBC: 9.1 10*3/uL (ref 4.0–10.5)
nRBC: 0 % (ref 0.0–0.2)

## 2023-01-03 LAB — BASIC METABOLIC PANEL
Anion gap: 9 (ref 5–15)
BUN: <5 mg/dL — ABNORMAL LOW (ref 6–20)
CO2: 19 mmol/L — ABNORMAL LOW (ref 22–32)
Calcium: 8.8 mg/dL — ABNORMAL LOW (ref 8.9–10.3)
Chloride: 109 mmol/L (ref 98–111)
Creatinine, Ser: 0.51 mg/dL (ref 0.44–1.00)
GFR, Estimated: >60 mL/min (ref >60)
Glucose, Bld: 91 mg/dL (ref 70–99)
Potassium: 3.7 mmol/L (ref 3.5–5.1)
Sodium: 137 mmol/L (ref 135–145)

## 2023-01-04 LAB — RPR: RPR Ser Ql: NONREACTIVE

## 2023-01-06 ENCOUNTER — Inpatient Hospital Stay (HOSPITAL_COMMUNITY)

## 2023-01-06 ENCOUNTER — Inpatient Hospital Stay (HOSPITAL_COMMUNITY)
Admission: RE | Admit: 2023-01-06 | Discharge: 2023-01-08 | DRG: 785 | Disposition: A | Discharge status: Home / Self Care | Attending: Obstetrics & Gynecology | Admitting: Obstetrics & Gynecology

## 2023-01-06 ENCOUNTER — Other Ambulatory Visit: Payer: Self-pay

## 2023-01-06 ENCOUNTER — Encounter (HOSPITAL_COMMUNITY): Payer: Self-pay | Admitting: Obstetrics & Gynecology

## 2023-01-06 ENCOUNTER — Encounter (HOSPITAL_COMMUNITY)
Admission: RE | Disposition: A | Discharge status: Home / Self Care | Payer: Self-pay | Source: Home / Self Care | Attending: Obstetrics & Gynecology

## 2023-01-06 DIAGNOSIS — O164 Unspecified maternal hypertension, complicating childbirth: Secondary | ICD-10-CM | POA: Diagnosis not present

## 2023-01-06 DIAGNOSIS — Z3A37 37 weeks gestation of pregnancy: Secondary | ICD-10-CM

## 2023-01-06 DIAGNOSIS — O26893 Other specified pregnancy related conditions, third trimester: Secondary | ICD-10-CM | POA: Diagnosis present

## 2023-01-06 DIAGNOSIS — O09529 Supervision of elderly multigravida, unspecified trimester: Secondary | ICD-10-CM

## 2023-01-06 DIAGNOSIS — R030 Elevated blood-pressure reading, without diagnosis of hypertension: Secondary | ICD-10-CM | POA: Diagnosis present

## 2023-01-06 DIAGNOSIS — Z302 Encounter for sterilization: Secondary | ICD-10-CM

## 2023-01-06 DIAGNOSIS — Z91040 Latex allergy status: Secondary | ICD-10-CM

## 2023-01-06 DIAGNOSIS — Z8759 Personal history of other complications of pregnancy, childbirth and the puerperium: Secondary | ICD-10-CM

## 2023-01-06 DIAGNOSIS — D259 Leiomyoma of uterus, unspecified: Secondary | ICD-10-CM | POA: Diagnosis present

## 2023-01-06 DIAGNOSIS — O9972 Diseases of the skin and subcutaneous tissue complicating childbirth: Secondary | ICD-10-CM | POA: Diagnosis present

## 2023-01-06 DIAGNOSIS — O34212 Maternal care for vertical scar from previous cesarean delivery: Principal | ICD-10-CM | POA: Diagnosis present

## 2023-01-06 DIAGNOSIS — D219 Benign neoplasm of connective and other soft tissue, unspecified: Secondary | ICD-10-CM | POA: Diagnosis present

## 2023-01-06 DIAGNOSIS — O99214 Obesity complicating childbirth: Secondary | ICD-10-CM | POA: Diagnosis present

## 2023-01-06 DIAGNOSIS — R002 Palpitations: Secondary | ICD-10-CM | POA: Diagnosis present

## 2023-01-06 DIAGNOSIS — L910 Hypertrophic scar: Secondary | ICD-10-CM | POA: Diagnosis present

## 2023-01-06 DIAGNOSIS — O99210 Obesity complicating pregnancy, unspecified trimester: Secondary | ICD-10-CM | POA: Insufficient documentation

## 2023-01-06 DIAGNOSIS — O34211 Maternal care for low transverse scar from previous cesarean delivery: Secondary | ICD-10-CM | POA: Diagnosis present

## 2023-01-06 DIAGNOSIS — O3413 Maternal care for benign tumor of corpus uteri, third trimester: Secondary | ICD-10-CM | POA: Diagnosis present

## 2023-01-06 DIAGNOSIS — Z98891 History of uterine scar from previous surgery: Principal | ICD-10-CM

## 2023-01-06 SURGERY — Surgical Case
Anesthesia: Spinal

## 2023-01-06 MED ORDER — OXYTOCIN-SODIUM CHLORIDE 30-0.9 UT/500ML-% IV SOLN
INTRAVENOUS | Status: AC
  Filled 2023-01-06: qty 500

## 2023-01-06 MED ORDER — TRIAMCINOLONE ACETONIDE 40 MG/ML IJ SUSP: 40.0000 mg | Freq: Once | INTRAMUSCULAR | Status: DC

## 2023-01-06 MED ORDER — HYDROMORPHONE HCL 1 MG/ML IJ SOLN: 0.2000 mg | INTRAMUSCULAR | Status: DC | PRN

## 2023-01-06 MED ORDER — NALOXONE HCL 0.4 MG/ML IJ SOLN: 0.4000 mg | INTRAMUSCULAR | Status: DC | PRN

## 2023-01-06 MED ORDER — LIDOCAINE 2% (20 MG/ML) 5 ML SYRINGE
INTRAMUSCULAR | Status: AC
  Filled 2023-01-06: qty 15

## 2023-01-06 MED ORDER — KETOROLAC TROMETHAMINE 30 MG/ML IJ SOLN
INTRAMUSCULAR | Status: AC
  Filled 2023-01-06: qty 1

## 2023-01-06 MED ORDER — POVIDONE-IODINE 10 % EX SWAB
2.0000 | Freq: Once | CUTANEOUS | Status: AC
  Administered 2023-01-06: 2 via TOPICAL

## 2023-01-06 MED ORDER — PHENYLEPHRINE HCL-NACL 20-0.9 MG/250ML-% IV SOLN
INTRAVENOUS | Status: AC
  Filled 2023-01-06: qty 250

## 2023-01-06 MED ORDER — SIMETHICONE 80 MG PO CHEW
80.0000 mg | CHEWABLE_TABLET | ORAL | Status: DC | PRN
  Administered 2023-01-07: 80 mg via ORAL
  Filled 2023-01-06: qty 1

## 2023-01-06 MED ORDER — MEPERIDINE HCL 25 MG/ML IJ SOLN: 6.2500 mg | INTRAMUSCULAR | Status: DC | PRN

## 2023-01-06 MED ORDER — PRENATAL MULTIVITAMIN CH
1.0000 | ORAL_TABLET | Freq: Every day | ORAL | Status: DC
  Administered 2023-01-07 – 2023-01-08 (×2): 1 via ORAL
  Filled 2023-01-06 (×2): qty 1

## 2023-01-06 MED ORDER — SCOPOLAMINE 1 MG/3DAYS TD PT72
MEDICATED_PATCH | TRANSDERMAL | Status: AC
  Filled 2023-01-06: qty 1

## 2023-01-06 MED ORDER — SODIUM CHLORIDE 0.9 % IR SOLN
Status: DC | PRN
  Administered 2023-01-06: 1000 mL

## 2023-01-06 MED ORDER — TRIAMCINOLONE ACETONIDE 40 MG/ML IJ SUSP
INTRAMUSCULAR | Status: AC
  Filled 2023-01-06: qty 1

## 2023-01-06 MED ORDER — DEXAMETHASONE SODIUM PHOSPHATE 10 MG/ML IJ SOLN
INTRAMUSCULAR | Status: AC
  Filled 2023-01-06: qty 1

## 2023-01-06 MED ORDER — CEFAZOLIN SODIUM-DEXTROSE 2-4 GM/100ML-% IV SOLN
2.0000 g | INTRAVENOUS | Status: AC
  Administered 2023-01-06: 2 g via INTRAVENOUS

## 2023-01-06 MED ORDER — COCONUT OIL OIL: 1.0000 | TOPICAL_OIL | Status: DC | PRN

## 2023-01-06 MED ORDER — LIDOCAINE HCL 1 % IJ SOLN
INTRAMUSCULAR | Status: AC
  Filled 2023-01-06: qty 20

## 2023-01-06 MED ORDER — FENTANYL CITRATE (PF) 100 MCG/2ML IJ SOLN
INTRAMUSCULAR | Status: DC | PRN
  Administered 2023-01-06: 15 ug via INTRAVENOUS

## 2023-01-06 MED ORDER — STERILE WATER FOR IRRIGATION IR SOLN
Status: DC | PRN
  Administered 2023-01-06: 1000 mL

## 2023-01-06 MED ORDER — KETOROLAC TROMETHAMINE 30 MG/ML IJ SOLN
30.0000 mg | Freq: Four times a day (QID) | INTRAMUSCULAR | Status: AC
  Administered 2023-01-06: 30 mg via INTRAVENOUS
  Filled 2023-01-06 (×2): qty 1

## 2023-01-06 MED ORDER — BUPIVACAINE IN DEXTROSE 0.75-8.25 % IT SOLN
INTRATHECAL | Status: AC
  Filled 2023-01-06: qty 2

## 2023-01-06 MED ORDER — LIDOCAINE HCL 1 % IJ SOLN: 20.0000 mL | Freq: Once | INTRAMUSCULAR | Status: DC

## 2023-01-06 MED ORDER — DEXAMETHASONE SODIUM PHOSPHATE 4 MG/ML IJ SOLN
INTRAMUSCULAR | Status: AC
  Filled 2023-01-06: qty 4

## 2023-01-06 MED ORDER — ZOLPIDEM TARTRATE 5 MG PO TABS: 5.0000 mg | ORAL_TABLET | Freq: Every evening | ORAL | Status: DC | PRN

## 2023-01-06 MED ORDER — SOD CITRATE-CITRIC ACID 500-334 MG/5ML PO SOLN
ORAL | Status: AC
  Filled 2023-01-06: qty 30

## 2023-01-06 MED ORDER — PROMETHAZINE HCL 25 MG/ML IJ SOLN: 6.2500 mg | INTRAMUSCULAR | Status: DC | PRN

## 2023-01-06 MED ORDER — SENNOSIDES-DOCUSATE SODIUM 8.6-50 MG PO TABS
2.0000 | ORAL_TABLET | Freq: Every day | ORAL | Status: DC
  Administered 2023-01-07: 2 via ORAL
  Filled 2023-01-06 (×3): qty 2

## 2023-01-06 MED ORDER — DIPHENHYDRAMINE HCL 25 MG PO CAPS: 25.0000 mg | ORAL_CAPSULE | Freq: Four times a day (QID) | ORAL | Status: DC | PRN

## 2023-01-06 MED ORDER — HYDROMORPHONE HCL 1 MG/ML IJ SOLN: 0.2500 mg | INTRAMUSCULAR | Status: DC | PRN

## 2023-01-06 MED ORDER — SODIUM CHLORIDE 0.9% FLUSH: 3.0000 mL | INTRAVENOUS | Status: DC | PRN

## 2023-01-06 MED ORDER — NALOXONE HCL 4 MG/10ML IJ SOLN: 1.0000 ug/kg/h | INTRAVENOUS | Status: DC | PRN

## 2023-01-06 MED ORDER — LACTATED RINGERS IV SOLN: INTRAVENOUS | Status: DC

## 2023-01-06 MED ORDER — CEFAZOLIN SODIUM-DEXTROSE 2-4 GM/100ML-% IV SOLN
INTRAVENOUS | Status: AC
  Filled 2023-01-06: qty 100

## 2023-01-06 MED ORDER — ONDANSETRON HCL 4 MG/2ML IJ SOLN: 4.0000 mg | Freq: Three times a day (TID) | INTRAMUSCULAR | Status: DC | PRN

## 2023-01-06 MED ORDER — MENTHOL 3 MG MT LOZG: 1.0000 | LOZENGE | OROMUCOSAL | Status: DC | PRN

## 2023-01-06 MED ORDER — BUPIVACAINE IN DEXTROSE 0.75-8.25 % IT SOLN
INTRATHECAL | Status: DC | PRN
  Administered 2023-01-06: 1.6 mL via INTRATHECAL

## 2023-01-06 MED ORDER — IBUPROFEN 600 MG PO TABS
600.0000 mg | ORAL_TABLET | Freq: Four times a day (QID) | ORAL | Status: DC
  Administered 2023-01-07 – 2023-01-08 (×5): 600 mg via ORAL
  Filled 2023-01-06 (×5): qty 1

## 2023-01-06 MED ORDER — SCOPOLAMINE 1 MG/3DAYS TD PT72: 1.0000 | MEDICATED_PATCH | Freq: Once | TRANSDERMAL | Status: DC

## 2023-01-06 MED ORDER — ONDANSETRON HCL 4 MG/2ML IJ SOLN
INTRAMUSCULAR | Status: AC
  Filled 2023-01-06: qty 2

## 2023-01-06 MED ORDER — TRIAMCINOLONE ACETONIDE 40 MG/ML IJ SUSP
INTRAMUSCULAR | Status: DC | PRN
  Administered 2023-01-06: 40 mg via INTRAMUSCULAR

## 2023-01-06 MED ORDER — WITCH HAZEL-GLYCERIN EX PADS: 1.0000 | MEDICATED_PAD | CUTANEOUS | Status: DC | PRN

## 2023-01-06 MED ORDER — METOCLOPRAMIDE HCL 5 MG/ML IJ SOLN
INTRAMUSCULAR | Status: DC | PRN
  Administered 2023-01-06: 10 mg via INTRAVENOUS

## 2023-01-06 MED ORDER — SCOPOLAMINE 1 MG/3DAYS TD PT72
1.0000 | MEDICATED_PATCH | TRANSDERMAL | Status: DC
  Administered 2023-01-06: 1.5 mg via TRANSDERMAL

## 2023-01-06 MED ORDER — FENTANYL CITRATE (PF) 100 MCG/2ML IJ SOLN
INTRAMUSCULAR | Status: AC
  Filled 2023-01-06: qty 2

## 2023-01-06 MED ORDER — OXYTOCIN-SODIUM CHLORIDE 30-0.9 UT/500ML-% IV SOLN
INTRAVENOUS | Status: DC | PRN
  Administered 2023-01-06: 300 [IU] via INTRAVENOUS

## 2023-01-06 MED ORDER — SUCCINYLCHOLINE CHLORIDE 200 MG/10ML IV SOSY
PREFILLED_SYRINGE | INTRAVENOUS | Status: AC
  Filled 2023-01-06: qty 10

## 2023-01-06 MED ORDER — ENOXAPARIN SODIUM 60 MG/0.6ML IJ SOSY
60.0000 mg | PREFILLED_SYRINGE | INTRAMUSCULAR | Status: DC
  Administered 2023-01-07 – 2023-01-08 (×2): 60 mg via SUBCUTANEOUS
  Filled 2023-01-06 (×2): qty 0.6

## 2023-01-06 MED ORDER — METOCLOPRAMIDE HCL 5 MG/ML IJ SOLN
INTRAMUSCULAR | Status: AC
  Filled 2023-01-06: qty 2

## 2023-01-06 MED ORDER — OXYTOCIN-SODIUM CHLORIDE 30-0.9 UT/500ML-% IV SOLN: 2.5000 [IU]/h | INTRAVENOUS | Status: AC

## 2023-01-06 MED ORDER — DIPHENHYDRAMINE HCL 25 MG PO CAPS: 25.0000 mg | ORAL_CAPSULE | ORAL | Status: DC | PRN

## 2023-01-06 MED ORDER — KETOROLAC TROMETHAMINE 30 MG/ML IJ SOLN
30.0000 mg | Freq: Once | INTRAMUSCULAR | Status: AC | PRN
  Administered 2023-01-06: 30 mg via INTRAVENOUS

## 2023-01-06 MED ORDER — MORPHINE SULFATE (PF) 0.5 MG/ML IJ SOLN
INTRAMUSCULAR | Status: AC
  Filled 2023-01-06: qty 10

## 2023-01-06 MED ORDER — ACETAMINOPHEN 500 MG PO TABS
1000.0000 mg | ORAL_TABLET | Freq: Four times a day (QID) | ORAL | Status: DC
  Administered 2023-01-06 – 2023-01-08 (×7): 1000 mg via ORAL
  Filled 2023-01-06 (×8): qty 2

## 2023-01-06 MED ORDER — PHENYLEPHRINE HCL-NACL 20-0.9 MG/250ML-% IV SOLN
INTRAVENOUS | Status: DC | PRN
  Administered 2023-01-06: 60 ug/min via INTRAVENOUS

## 2023-01-06 MED ORDER — PHENYLEPHRINE 80 MCG/ML (10ML) SYRINGE FOR IV PUSH (FOR BLOOD PRESSURE SUPPORT)
PREFILLED_SYRINGE | INTRAVENOUS | Status: AC
  Filled 2023-01-06: qty 20

## 2023-01-06 MED ORDER — DIPHENHYDRAMINE HCL 50 MG/ML IJ SOLN: 12.5000 mg | INTRAMUSCULAR | Status: DC | PRN

## 2023-01-06 MED ORDER — SOD CITRATE-CITRIC ACID 500-334 MG/5ML PO SOLN
30.0000 mL | Freq: Once | ORAL | Status: AC
  Administered 2023-01-06: 30 mL via ORAL

## 2023-01-06 MED ORDER — ONDANSETRON HCL 4 MG/2ML IJ SOLN
INTRAMUSCULAR | Status: DC | PRN
  Administered 2023-01-06: 4 mg via INTRAVENOUS

## 2023-01-06 MED ORDER — DIBUCAINE (PERIANAL) 1 % EX OINT: 1.0000 | TOPICAL_OINTMENT | CUTANEOUS | Status: DC | PRN

## 2023-01-06 MED ORDER — PHENYLEPHRINE 80 MCG/ML (10ML) SYRINGE FOR IV PUSH (FOR BLOOD PRESSURE SUPPORT)
PREFILLED_SYRINGE | INTRAVENOUS | Status: AC
  Filled 2023-01-06: qty 10

## 2023-01-06 MED ORDER — ACETAMINOPHEN 500 MG PO TABS
1000.0000 mg | ORAL_TABLET | Freq: Once | ORAL | Status: AC
  Administered 2023-01-06: 1000 mg via ORAL

## 2023-01-06 MED ORDER — MORPHINE SULFATE (PF) 0.5 MG/ML IJ SOLN
INTRAMUSCULAR | Status: DC | PRN
  Administered 2023-01-06: 150 ug via EPIDURAL

## 2023-01-06 MED ORDER — OXYCODONE HCL 5 MG PO TABS: 5.0000 mg | ORAL_TABLET | ORAL | Status: DC | PRN

## 2023-01-06 MED ORDER — DEXAMETHASONE SODIUM PHOSPHATE 10 MG/ML IJ SOLN
INTRAMUSCULAR | Status: DC | PRN
  Administered 2023-01-06: 10 mg via INTRAVENOUS

## 2023-01-06 MED ORDER — ACETAMINOPHEN 500 MG PO TABS
ORAL_TABLET | ORAL | Status: AC
  Filled 2023-01-06: qty 2

## 2023-01-06 MED ORDER — LIDOCAINE HCL (PF) 1 % IJ SOLN
INTRAMUSCULAR | Status: DC | PRN
  Administered 2023-01-06: 30 mL via SUBCUTANEOUS

## 2023-01-06 MED ORDER — DEXMEDETOMIDINE HCL IN NACL 80 MCG/20ML IV SOLN
INTRAVENOUS | Status: AC
  Filled 2023-01-06: qty 20

## 2023-01-06 SURGICAL SUPPLY — 48 items
ADH SKN CLS APL DERMABOND .7 (GAUZE/BANDAGES/DRESSINGS)
APL PRP STRL LF DISP 70% ISPRP (MISCELLANEOUS) ×2
CANISTER PREVENA PLUS 150 (CANNISTER) IMPLANT
CHLORAPREP W/TINT 26 (MISCELLANEOUS) ×2 IMPLANT
CLAMP UMBILICAL CORD (MISCELLANEOUS) ×1 IMPLANT
CLOTH BEACON ORANGE TIMEOUT ST (SAFETY) ×1 IMPLANT
DERMABOND ADVANCED .7 DNX12 (GAUZE/BANDAGES/DRESSINGS) IMPLANT
DRAPE C SECTION CLR SCREEN (DRAPES) ×1 IMPLANT
DRESSING PREVENA PLUS CUSTOM (GAUZE/BANDAGES/DRESSINGS) IMPLANT
DRSG OPSITE POSTOP 4X10 (GAUZE/BANDAGES/DRESSINGS) ×1 IMPLANT
DRSG PREVENA PLUS CUSTOM (GAUZE/BANDAGES/DRESSINGS) ×2
ELECT REM PT RETURN 9FT ADLT (ELECTROSURGICAL) ×1
ELECTRODE REM PT RTRN 9FT ADLT (ELECTROSURGICAL) ×1 IMPLANT
EXTRACTOR VACUUM KIWI (MISCELLANEOUS) ×1 IMPLANT
GLOVE BIOGEL PI IND STRL 7.0 (GLOVE) ×2 IMPLANT
GLOVE SURG SS PI 6.5 STRL IVOR (GLOVE) ×1 IMPLANT
GOWN STRL REUS W/TWL LRG LVL3 (GOWN DISPOSABLE) ×2 IMPLANT
KIT ABG SYR 3ML LUER SLIP (SYRINGE) IMPLANT
LIGASURE IMPACT 36 18CM CVD LR (INSTRUMENTS) IMPLANT
MAT PREVALON FULL STRYKER (MISCELLANEOUS) IMPLANT
NDL HYPO 22X1.5 SAFETY MO (MISCELLANEOUS) IMPLANT
NDL HYPO 25X5/8 SAFETYGLIDE (NEEDLE) IMPLANT
NDL KEITH (NEEDLE) ×1 IMPLANT
NEEDLE HYPO 22X1.5 SAFETY MO (MISCELLANEOUS) ×1 IMPLANT
NEEDLE HYPO 25X5/8 SAFETYGLIDE (NEEDLE) IMPLANT
NEEDLE KEITH (NEEDLE) ×1 IMPLANT
NEEDLE SAFETY HYPO 22GAX1.5 (MISCELLANEOUS) ×1
NS IRRIG 1000ML POUR BTL (IV SOLUTION) ×1 IMPLANT
PACK C SECTION WH (CUSTOM PROCEDURE TRAY) ×1 IMPLANT
PAD OB MATERNITY 4.3X12.25 (PERSONAL CARE ITEMS) ×1 IMPLANT
RETRACTOR TRAXI PANNICULUS (MISCELLANEOUS) IMPLANT
RTRCTR C-SECT PINK 25CM LRG (MISCELLANEOUS) ×1 IMPLANT
SUT CHROMIC 1 CTX 36 (SUTURE) IMPLANT
SUT CHROMIC 2 0 CT 1 (SUTURE) ×1 IMPLANT
SUT MON AB 4-0 PS1 27 (SUTURE) IMPLANT
SUT PDS AB 0 CTX 60 (SUTURE) IMPLANT
SUT PLAIN 1 NONE 54 (SUTURE) IMPLANT
SUT PLAIN 2 0 (SUTURE) ×1
SUT PLAIN 2 0 XLH (SUTURE) IMPLANT
SUT PLAIN ABS 2-0 CT1 27XMFL (SUTURE) IMPLANT
SUT VIC AB 0 CTX 36 (SUTURE) ×1
SUT VIC AB 0 CTX36XBRD ANBCTRL (SUTURE) ×1 IMPLANT
SUT VIC AB 1 CTX 36 (SUTURE) ×2
SUT VIC AB 1 CTX36XBRD ANBCTRL (SUTURE) ×2 IMPLANT
SYR CONTROL 10ML LL (SYRINGE) IMPLANT
TOWEL OR 17X24 6PK STRL BLUE (TOWEL DISPOSABLE) ×1 IMPLANT
TRAY FOLEY W/BAG SLVR 14FR LF (SET/KITS/TRAYS/PACK) ×1 IMPLANT
WATER STERILE IRR 1000ML POUR (IV SOLUTION) ×1 IMPLANT

## 2023-01-06 NOTE — Anesthesia Preprocedure Evaluation (Addendum)
Anesthesia Evaluation  Patient identified by MRN, date of birth, ID band Patient awake    Reviewed: Allergy & Precautions, Patient's Chart, lab work & pertinent test results  History of Anesthesia Complications Negative for: history of anesthetic complications  Airway Mallampati: II  TM Distance: >3 FB Neck ROM: Full    Dental no notable dental hx. (+) Dental Advisory Given   Pulmonary neg pulmonary ROS   Pulmonary exam normal breath sounds clear to auscultation       Cardiovascular hypertension, Normal cardiovascular exam Rhythm:Regular Rate:Normal     Neuro/Psych negative neurological ROS     GI/Hepatic negative GI ROS, Neg liver ROS,,,  Endo/Other    Morbid obesity  Renal/GU negative Renal ROS     Musculoskeletal negative musculoskeletal ROS (+)    Abdominal  (+) + obese  Peds  Hematology negative hematology ROS (+)   Anesthesia Other Findings   Reproductive/Obstetrics (+) Pregnancy (gHTN, [redacted]w[redacted]d gestation, severe IUGR)                              Anesthesia Physical Anesthesia Plan  ASA: 3  Anesthesia Plan: Spinal   Post-op Pain Management: Tylenol PO (pre-op)* and Toradol IV (intra-op)*   Induction:   PONV Risk Score and Plan: 4 or greater and Treatment may vary due to age or medical condition, Ondansetron and Dexamethasone  Airway Management Planned: Natural Airway  Additional Equipment: None  Intra-op Plan:   Post-operative Plan:   Informed Consent: I have reviewed the patients History and Physical, chart, labs and discussed the procedure including the risks, benefits and alternatives for the proposed anesthesia with the patient or authorized representative who has indicated his/her understanding and acceptance.     Dental advisory given  Plan Discussed with: CRNA  Anesthesia Plan Comments:          Anesthesia Quick Evaluation

## 2023-01-06 NOTE — Op Note (Addendum)
Patient:  Morgan Boyd. Waag DOB: 1986-03-24 MRN:  [id number]  DATE OF SURGERY:  01/06/2023.  PREOP DIAGNOSIS:  1. 37 week 0 day EGA IUP. 2.  History of 1 prior classical cesarean section and is for a repeat cesarean delivery. 3. Multiparous patient desiring permanent sterilization.  4. Previous cesarean section skin incision with keloid.  5. Uterus with multiple fibroids.  6. BMI 45.  POSTOP DIAGNOSIS: Same as above.    PROCEDURE:  1. Repeat low uterine segment transverse cesarean section via Pfannenstiel incision with revision of keloid scar.  2. Postpartum bilateral tubal ligation via bilateral complete salpingectomy.   SURGEON: Dr.  Waymon Amato  ASSISTANTS: Dr. Bing Matter, Donnel Saxon, CNM.   SURGEON ATTESTATION: I was present and scrubbed for the entire case.  Experienced assistants was required given the standard of surgical care and the complexity of the case.  The assistants wwere needed for exposure, dissection, suctioning, retraction, instrument exchange,  assisting with delivery with administration of fundal pressure, and for overall help during the surgery.    ANESTHESIA: Spinal, Dr. Jenny Reichmann.[place].  COMPLICATIONS: None.  FINDINGS: Viable female infant in cephalic presentation, DOA position, weight 6 lbs 9 oz, Apgar scores of  8 and 9. Bulky uterus with multiple fibroids, largest one palpated was about 5 cm in right fundal area.  Normal bilateral ovaries.    Right fallopian rube with small paratubal cyst and tiny cauliflower white lesion at the fimbria end.  Normal left fallopian tube.    EBL:   563 cc cc.  IV FLUID:  2000 cc LR.  URINE OUTPUT: 100 cc clear urine.  INDICATIONS:  37 y/o [id number] who presented for a repeat cesarean and bilateral tubal ligation at [redacted] weeks EGA.  She was consented for the procedures after explaining risks benefits and alternatives of the procedures including but not limited to risks of heavy bleeding, infection and damage to organs.  We  additionally discussed risks of tubal ligation regret but she stated she was 100% sure she did not want any more children.  She understood there were other kinds of birth control such as pills, patches, IUDs, vaginal rings and depo provera which were temporary but she did not desire them.  She understood there was also an option of female sterilization but she did not desire that option either.  She desired bilateral complete salpingectomy for sterilization.  She did not desire partial salpingectomy for sterilization.  All her questions were answered pre-operatively and consent forms were signed.   PROCEDURE:  She was taken to the operating room where her spinal anesthesia was found to be adequate. The traxi pannus retractor was placed on her abdomen.  She was prepped abdominally with chloraprep and vaginally with betadine.  She was draped in the usual sterile fashion, foley catheter and SCDs was placed. She received IV ancef preoperatively. The  previous keloided  pfannenstiel incision was excised with a scalpel.  The incision was extended through the subcutaneous layer and also the fascia with the bovie. Small perforators in the subcutaneous layer were contained with the Bovie. The fascia was nicked in the midline and then was further separated from the rectus muscles bilaterally using Mayo scissors. Kochers were placed inferiorly and then superiorly to allow further separation of fascia from the rectus muscles with Mayo scissors.  The peritoneal cavity was entered bluntly with hemostats and stretched out.  The Alexis retractor was placed in. The vesicouterine fold dissection was created using Metzenbaum scissors.  The uterus was incised  with a scalpel transversely and the incision was extended bluntly bilaterally with fingers.  Membranes were ruptured and moderate clear amniotic fluid was noted.  The head was delivered with head flexion and fundal pressure then the rest of the body was delivered with fundal  pressure.  She delivered a viable female infant, apgar scores 8, 9.  The edges of the uterus was grasped with T clamps.  The cord was clamped and cut after 1 minute. Cord blood was collected.    The uterus was not exteriorized.  The placenta was delivered with gentle traction on the umbilical cord.  The uterus was cleared of clots and debris with a lap.  The uterine incision was closed with #1 Vicryl in a running locked stitch. A small area that bled on the left side was contained with figure of 8 stitch.  A lap was placed over the incision and attention turned to the tubes.    The left  fallopian tube was then identified, brought to the incision and followed up to the fimbria end.    The hand held Ligasure impact was used to completely transect the fallopian tube from the mesosalpinx leaving about a 1 cm cornual stump.  Excellent hemostasis was noted on the remaining pedicles.  A similar procedure was done to remove the right fallopian tube.  Both uterine cornua and remaining mesosalpinx were hemostatic.  Irrigation was applied and suctioned out.  The uterine incision was noted to be hemostatic.  The peritoneum was then reapproximated using 2-0 chromic suture.  Rectus muscles were noted to thinned out and far apart and could not be re approximated.  Fascia was closed using 0 looped PDS.  The subcutaneous layer was irrigated and suctioned out. Small perforators were contained with the bovie.  The subcutaneous layer was closed using 1-0 plain in interrupted stitches.  Kenalog 40 mg/ml mixed with 20 cc 1% lidocaine was then injected into the dermis.  4- 0 monocryl was used to close the incision in a subcuticular stitch.  Patient was cleaned and dried.  Prevena dressing was applied.   She was further cleaned and then taken to the recovery room with her baby in stable conditions.   SPECIMEN:  Placenta to labor and delivery.  Umbilical cord blood to lab.  Left and right fallopian tubes to pathology. .    DISPOSITION: TO PACU, STABLE.   Dr. Waymon Amato.  Date:01/06/2023.

## 2023-01-06 NOTE — Transfer of Care (Cosign Needed)
Immediate Anesthesia Transfer of Care Note  Patient: Morgan Boyd  Procedure(s) Performed: CESAREAN SECTION  Patient Location: PACU  Anesthesia Type:Spinal  Level of Consciousness: awake, alert , and oriented  Airway & Oxygen Therapy: Patient Spontanous Breathing  Post-op Assessment: Report given to RN and Post -op Vital signs reviewed and stable  Post vital signs: Reviewed and stable  Last Vitals:  Vitals Value Taken Time  BP 127/87 01/06/23 1722  Temp    Pulse 83 01/06/23 1725  Resp 22 01/06/23 1725  SpO2 99 % 01/06/23 1725  Vitals shown include unvalidated device data.  Last Pain:  Vitals:   01/06/23 1324  TempSrc: Oral         Complications: No notable events documented.

## 2023-01-06 NOTE — Interval H&P Note (Signed)
History and Physical Interval Note:  01/06/2023 2:46 PM  Morgan Boyd  has presented today for surgery, with the diagnosis of [place], desires sterilization.  The various methods of treatment have been discussed with the patient and family. After consideration of risks, benefits and other options for treatment, the patient has consented to  Procedure(s): CESAREAN SECTION (N/A) AND BILATERAL TUBAL LIGATION  VIA REMOVAL OF BOTH FALLOPIAN TUBES as a surgical intervention.  The patient's history has been reviewed, patient examined, no change in status, stable for surgery.  I have reviewed the patient's chart and labs.  Questions were answered to the patient's satisfaction.     Archie Endo, MD.

## 2023-01-06 NOTE — Progress Notes (Signed)
Dr. Alesia Richards just called on my vocera.  Inquiring about BP values.  Last BP was WNL.  Getting ready to recheck BP, Dr Alesia Richards said if BP is elevated again, add a CMP and urine PCR to the 5am CBC.  (Ok to get urine PCR from urinary cath).  Also discontinued the creatine for 5am per Dr. Alesia Richards.   If BP in severe range, call [place].

## 2023-01-06 NOTE — Anesthesia Procedure Notes (Signed)
Spinal  Patient location during procedure: OR Start time: 01/06/2023 3:00 PM End time: 01/06/2023 3:07 PM Reason for block: surgical anesthesia Staffing Performed: anesthesiologist  Anesthesiologist: Nolon Nations, MD Performed by: Nolon Nations, MD Authorized by: Nolon Nations, MD   Preanesthetic Checklist Completed: patient identified, IV checked, site marked, risks and benefits discussed, surgical consent, monitors and equipment checked, pre-op evaluation and timeout performed Spinal Block Patient position: sitting Prep: DuraPrep and site prepped and draped Patient monitoring: heart rate, continuous pulse ox and blood pressure Approach: midline Location: L3-4 Injection technique: single-shot Needle Needle type: Spinocan  Needle gauge: 25 G Needle length: 9 cm Additional Notes Expiration date of kit checked and confirmed. Patient tolerated procedure well, without complications.

## 2023-01-06 NOTE — Progress Notes (Signed)
Looking back at the delivery record from 2022 it states pt had a low transverse incision.  RN reviewed MD post op note on 05/14/21, stating a classical uterine incision was done.  Pt also confirms this.

## 2023-01-06 NOTE — Anesthesia Postprocedure Evaluation (Signed)
Anesthesia Post Note  Patient: Morgan Boyd  Procedure(s) Performed: CESAREAN SECTION     Patient location during evaluation: PACU Anesthesia Type: Spinal Level of consciousness: awake and alert Pain management: pain level controlled Vital Signs Assessment: post-procedure vital signs reviewed and stable Respiratory status: spontaneous breathing Cardiovascular status: stable Anesthetic complications: no   No notable events documented.  Last Vitals:  Vitals:   01/06/23 1815 01/06/23 1830  BP: 122/76 (!) 123/96  Pulse:  77  Resp: (!) 25 19  Temp: 36.7 C 37 C  SpO2: 99% 100%    Last Pain:  Vitals:   01/06/23 1830  TempSrc: Axillary  PainSc:                  Nolon Nations

## 2023-01-07 ENCOUNTER — Encounter (HOSPITAL_COMMUNITY): Payer: Self-pay | Admitting: Obstetrics & Gynecology

## 2023-01-07 LAB — CBC
HCT: 34.5 % — ABNORMAL LOW (ref 36.0–46.0)
Hemoglobin: 11.5 g/dL — ABNORMAL LOW (ref 12.0–15.0)
MCH: 28.4 pg (ref 26.0–34.0)
MCHC: 33.3 g/dL (ref 30.0–36.0)
MCV: 85.2 fL (ref 80.0–100.0)
Platelets: 218 10*3/uL (ref 150–400)
RBC: 4.05 MIL/uL (ref 3.87–5.11)
RDW: 14.1 % (ref 11.5–15.5)
WBC: 15.1 10*3/uL — ABNORMAL HIGH (ref 4.0–10.5)
nRBC: 0 % (ref 0.0–0.2)

## 2023-01-07 NOTE — Progress Notes (Signed)
Subjective: POD# 1 Information for the patient's newborn:  Morgan Boyd, Morgan Boyd [id number]  female   Reports feeling "good, better than last time" Feeding: breast Reports tolerating PO and denies N/V, foley removed, ambulating and urinating w/o difficulty  Pain controlled with  PO meds Denies HA/SOB/dizziness  Flatus not passing Vaginal bleeding is normal, no clots     Objective:  VS:  Vitals:   01/06/23 2000 01/06/23 2328 01/07/23 0435 01/07/23 0445  BP: 119/73 119/64 122/80 108/60  Pulse: (!) 101 86 85 79  Resp: 18 18 18    Temp: 98.1 F (36.7 C) (!) 97.5 F (36.4 C) 98.2 F (36.8 C)   TempSrc: Oral Oral Oral   SpO2: 96% 96% 98%   Weight:      Height:        Intake/Output Summary (Last 24 hours) at 01/07/2023 0540 Last data filed at 01/07/2023 0435 Gross per 24 hour  Intake 2000 ml  Output 1688 ml  Net 312 ml     No results for input(s): "WBC", "HGB", "HCT", "PLT" in the last 72 hours.  Blood type: --/--/A POS (03/15 1020) Rubella: Immune (09/25 0000)    Physical Exam:  General: alert, cooperative, and no distress CV: Regular rate and rhythm or without murmur or extra heart sounds Resp: clear Abdomen: distended, tympany x4 quads, hypoactive x4 quads Incision:  provena suction dressing, seal is intact, minimal, serous drainage Uterine Fundus: firm, below umbilicus, nontender Lochia: minimal Ext: trace edema,  neg for pain, tenderness, or cords   Assessment/Plan: 37 y.o.   POD# 1. [id number]                  Principal Problem:   Status post repeat low transverse cesarean section Active Problems:   Fibroids   AMA (advanced maternal age) multigravida 35+   Delivery by classical cesarean section   History of prior pregnancy with IUGR newborn   History of severe pre-eclampsia   Palpitations   Maternal obesity affecting pregnancy, antepartum--BMI 46   Routine post-op PP care          Advance diet as tolerated Advised warm fluids and ambulation to improve GI  motility Breastfeeding support Anticipate D/C on POD 2 or [place], DNP, CNM 01/07/2023, 5:40 AM

## 2023-01-07 NOTE — Progress Notes (Signed)
Talked to Dr Alesia Richards re: loss of IV access.  Ok to start Ibuprofen PO now.  If patient has another high BP, order a CMP and urine PCR.

## 2023-01-07 NOTE — Lactation Note (Addendum)
This note was copied from a baby's chart. Lactation Consultation Note  Patient Name: Morgan Boyd [id number] Date: 01/07/2023 Age:39 hours Reason for consult: Initial assessment;Early term 37-38.6wks;Difficult latch  P4, Baby 37 weeks and has been sleepy at the breast.  Reviewed hand expression and gave baby drops on spoon. Feed on demand with cues.  Goal 8-12+ times per day after first 24 hrs.  Place baby STS if not cueing.   Wake baby for feedings.  Mother agreed to supplement with donor milk and consent was signed. Mother pumped 5 ml which was given to baby with finger & syringe.  Once donor milk arrives goal will be 14 ml.   Plan: Keep baby STS as much as possible  Offer breast when baby cues that she is hungry, or awaken baby for feeding at 3 hrs.  Breast feed baby, asking for help prn.  Limit total feeding time to 30 mins so not to overtire baby. If baby does not latch after 5- 0 min of attempt - give supplemental breastmilk/donor milk.  Slow flow nipple bottle is an option.   Pump both breasts 15-20 minutes on initiation setting, adding breast massage and hand expression to collect as much colostrum as possible to feed baby.  Feed baby 10-14 ml EBM+/donor milk after breastfeeding per LPTI volume guidelines increasing per day of life and as baby desires.   Maternal Data Has patient been taught Hand Expression?: Yes Does the patient have breastfeeding experience prior to this delivery?: Yes How long did the patient breastfeed?: 2 & 3 years and then pumped for one year with 3rd child NICU  Feeding Mother's Current Feeding Choice: Breast Milk and Donor Milk  LATCH Score Latch: Repeated attempts needed to sustain latch, nipple held in mouth throughout feeding, stimulation needed to elicit sucking reflex.  Audible Swallowing: None  Type of Nipple: Everted at rest and after stimulation  Comfort (Breast/Nipple): Soft / non-tender  Hold (Positioning): Assistance needed to  correctly position infant at breast and maintain latch.  LATCH Score: 6   Lactation Tools Discussed/Used  DEBP  Interventions Interventions: Breast feeding basics reviewed;Assisted with latch;Skin to skin;Hand express;Breast compression;Breast massage;Adjust position;Support pillows;Position options;Expressed milk;DEBP;Education;LC Services brochure;LPT handout/interventions  Consult Status Consult Status: Follow-up Date: 01/08/23 Follow-up type: In-patient    Vivianne Master [facility] 01/07/2023, 9:27 AM

## 2023-01-08 DIAGNOSIS — R030 Elevated blood-pressure reading, without diagnosis of hypertension: Secondary | ICD-10-CM | POA: Insufficient documentation

## 2023-01-08 LAB — BIRTH TISSUE RECOVERY COLLECTION (PLACENTA DONATION)

## 2023-01-08 LAB — SURGICAL PATHOLOGY

## 2023-01-08 MED ORDER — IBUPROFEN 600 MG PO TABS: 600.0000 mg | ORAL_TABLET | Freq: Four times a day (QID) | ORAL | 0 refills | Status: DC

## 2023-01-08 MED ORDER — OXYCODONE HCL 5 MG PO TABS: 5.0000 mg | ORAL_TABLET | ORAL | 0 refills | Status: AC | PRN

## 2023-01-08 MED ORDER — DOCUSATE SODIUM 100 MG PO CAPS: 100.0000 mg | ORAL_CAPSULE | Freq: Two times a day (BID) | ORAL | 0 refills | Status: AC | PRN

## 2023-01-08 NOTE — Lactation Note (Signed)
This note was copied from a baby's chart. Lactation Consultation Note  Patient Name: Morgan Boyd [id number] Date: 01/08/2023 Age:39 hours Reason for consult: Follow-up assessment;Early term 37-38.6wks;Infant weight loss  P4, 8% weight loss.  Mother has been breastfeeding and supplementing with her milk.  She is now pumping 30 ml. Baby recently was breastfed for 10 min and supplemented with 22 ml and 14 ml of mother's milk.  Mother currently pumping good volume 60 ml+.  She will receive her DEBP tomorrow but has paperwork for [place] and has been in touch with WIC.  Suggest calling for next feeding for LC to view latch.   Maternal Data Has patient been taught Hand Expression?: Yes  Feeding Mother's Current Feeding Choice: Breast Milk and Donor Milk  Lactation Tools Discussed/Used Tools: Pump Flange Size: 24 Breast pump type: Double-Electric Breast Pump;Manual Pumping frequency: q 3 hours Pumped volume: 90 mL  Interventions Interventions: DEBP;Education  Discharge Pump:  ([employer]ter called aeroflow and pump will arrive tomorrow.)  Consult Status Consult Status: Follow-up Date: 01/09/23 Follow-up type: In-patient    Vivianne Master [facility] 01/08/2023, 10:44 AM

## 2023-01-08 NOTE — Discharge Summary (Signed)
Postpartum Discharge Summary  Date of Service 01/08/2023     Patient Name: Morgan Boyd DOB: [birth year] MRN: [id number]  Date of admission: 01/06/2023 Delivery date:01/06/2023  Delivering provider: Waymon Amato  Date of discharge: 01/08/2023  Admitting diagnosis: Status post repeat low transverse cesarean section [Z98.891] Intrauterine pregnancy: [redacted]w[redacted]d     Secondary diagnosis:  Principal Problem:   Status post repeat low transverse cesarean section Active Problems:   Fibroids   AMA (advanced maternal age) multigravida 35+   Delivery by classical cesarean section   History of prior pregnancy with IUGR newborn   History of severe pre-eclampsia   Palpitations   Maternal obesity affecting pregnancy, antepartum--BMI 46   Elevated blood pressure reading without diagnosis of hypertension  Additional problems: None    Discharge diagnosis: Term Pregnancy Delivered                                              Post partum procedures: bilateral salpingectomy with c-section Augmentation: N/A Complications: None  Hospital course: Sceduled C/S   37 y.o. yo [id number] at [redacted]w[redacted]d was admitted to the hospital 01/06/2023 for scheduled cesarean section with the following indication:Elective Repeat.Delivery details are as follows:  Membrane Rupture Time/Date: 3:48 PM ,01/06/2023   Delivery Method:C-Section, Low Transverse  Details of operation can be found in separate operative note.  Patient had a postpartum course complicated by none.  She is ambulating, tolerating a regular diet, passing flatus, and urinating well. Patient is discharged home in stable condition on  01/08/23        Newborn Data: Birth date:01/06/2023  Birth time:3:48 PM  Gender:Female  Living status:Living  Apgars:8 ,9  Weight:2970 g     Magnesium Sulfate received: No BMZ received: No Rhophylac:N/A MMR:N/A Transfusion:No  Physical exam  Vitals:   01/07/23 1436 01/07/23 2100 01/07/23 2200 01/08/23 0555  BP: 131/69 (!)  145/88 114/70 137/70  Pulse: 99 90 96 89  Resp: 18 18  18   Temp:  98.1 F (36.7 C)  97.8 F (36.6 C)  TempSrc:  Oral  Oral  SpO2: 96% 97%  98%  Weight:      Height:       General: alert, cooperative, and no distress Lochia: appropriate Uterine Fundus: firm Incision: Dressing is clean, dry, and intact, Prevena dressing in place to suction DVT Evaluation: No evidence of DVT seen on physical exam. Labs: Lab Results  Component Value Date   WBC 15.1 (H) 01/07/2023   HGB 11.5 (L) 01/07/2023   HCT 34.5 (L) 01/07/2023   MCV 85.2 01/07/2023   PLT 218 01/07/2023      Latest Ref Rng & Units 01/03/2023   10:14 AM  CMP  Glucose 70 - 99 mg/dL 91   BUN 6 - 20 mg/dL <5   Creatinine 0.44 - 1.00 mg/dL 0.51   Sodium 135 - 145 mmol/L 137   Potassium 3.5 - 5.1 mmol/L 3.7   Chloride 98 - 111 mmol/L 109   CO2 22 - 32 mmol/L 19   Calcium 8.9 - 10.3 mg/dL 8.8    Edinburgh Score:    01/07/2023    2:40 AM  Edinburgh Postnatal Depression Scale Screening Tool  I have been able to laugh and see the funny side of things. 1  I have looked forward with enjoyment to things. 1  I have blamed myself unnecessarily when  things went wrong. 0  I have been anxious or worried for no good reason. 0  I have felt scared or panicky for no good reason. 0  Things have been getting on top of me. 1  I have been so unhappy that I have had difficulty sleeping. 0  I have felt sad or miserable. 1  I have been so unhappy that I have been crying. 1  The thought of harming myself has occurred to me. 0  Edinburgh Postnatal Depression Scale Total 5      After visit meds:  Allergies as of 01/08/2023       Reactions   Latex Itching, Rash   Sodium Bicarbonate Dermatitis, Itching, Rash   Sulfa Antibiotics Palpitations   Faint, dizzy, shaking        Medication List     STOP taking these medications    aspirin EC 81 MG tablet       TAKE these medications    albuterol 108 (90 Base) MCG/ACT  inhaler Commonly known as: VENTOLIN HFA Inhale 2 puffs into the lungs every 6 (six) hours as needed for wheezing or shortness of breath.   Calcium 1200 1200-1000 MG-UNIT Chew Chew 1 each by mouth daily.   cetirizine 10 MG tablet Commonly known as: ZYRTEC Take 10 mg by mouth daily as needed for allergies or rhinitis.   cholecalciferol 25 MCG (1000 UNIT) tablet Commonly known as: VITAMIN D3 Take 1,000 Units by mouth daily.   CHOLINE BITARTRATE PO Take 500 mg by mouth daily.   cyanocobalamin 1000 MCG tablet Commonly known as: VITAMIN B12 Take 1,000 mcg by mouth daily.   docusate sodium 100 MG capsule Commonly known as: Colace Take 1 capsule (100 mg total) by mouth 2 (two) times daily as needed for up to 10 days for mild constipation.   ibuprofen 600 MG tablet Commonly known as: ADVIL Take 1 tablet (600 mg total) by mouth every 6 (six) hours.   oxyCODONE 5 MG immediate release tablet Commonly known as: Oxy IR/ROXICODONE Take 1 tablet (5 mg total) by mouth every 4 (four) hours as needed for up to 5 days for moderate pain.   PRENATAL VITAMINS PO Take 1 tablet by mouth daily.   promethazine 25 MG tablet Commonly known as: PHENERGAN Take 25 mg by mouth every 6 (six) hours as needed for vomiting or nausea.   Xolair 150 MG injection Generic drug: omalizumab INJECT 300 MG UNDER THE SKIN EVERY 28 DAYS               Discharge Care Instructions  (From admission, onward)           Start     Ordered   01/08/23 0000  Discharge wound care:       Comments: Keep Prevena Wound Vac clean and dry. To be removed in office in 1 week.   01/08/23 1233             Discharge home in stable condition Infant Feeding: Bottle and pumping Infant Disposition:home with mother Discharge instruction: per After Visit Summary and Postpartum booklet. Activity: Advance as tolerated. Pelvic rest for 6 weeks.  Diet: routine diet Anticipated Birth Control: BTL done PP Postpartum  Appointment:1 week Additional Postpartum F/U: Incision check 1 week and BP check 1 week. Isolated elevated BP of 145/88 PP Future Appointments: Future Appointments  Date Time Provider [place]  01/15/2023  1:30 PM [facility] NURSE [facility] None  05/07/2023 10:00 AM Tobb, Godfrey Pick, DO [facility] None  Follow up Visit:  Follow-up Information     Waymon Amato, MD. Schedule an appointment as soon as possible for a visit in 1 week(s).   Specialty: Obstetrics and Gynecology Why: Incision and BP check Contact information: [place] [address] [phone]                     01/08/2023 Josefine Class, MD

## 2023-01-15 ENCOUNTER — Ambulatory Visit

## 2023-01-17 ENCOUNTER — Telehealth (HOSPITAL_COMMUNITY): Payer: Self-pay | Admitting: *Deleted

## 2023-01-17 NOTE — Telephone Encounter (Signed)
Mom reports feeling good. Incision healing well per mom. Some discomfort with movement on one side of incision, but has an appt with OB on Monday to check it out then. No other concerns regarding herself at this time. EPDS=1 (hospital score=5) Mom reports baby is well. Feeding, peeing, and pooping without difficulty. Reviewed safe sleep. Mom has no concerns about baby at present.  Odis Hollingshead, RN 01-17-2023 at 2:21pm

## 2023-01-23 ENCOUNTER — Ambulatory Visit (INDEPENDENT_AMBULATORY_CARE_PROVIDER_SITE_OTHER)

## 2023-01-23 DIAGNOSIS — L501 Idiopathic urticaria: Secondary | ICD-10-CM

## 2023-02-27 ENCOUNTER — Ambulatory Visit (INDEPENDENT_AMBULATORY_CARE_PROVIDER_SITE_OTHER): Admitting: Allergy & Immunology

## 2023-02-27 ENCOUNTER — Other Ambulatory Visit: Payer: Self-pay

## 2023-02-27 ENCOUNTER — Encounter: Payer: Self-pay | Admitting: Allergy & Immunology

## 2023-02-27 ENCOUNTER — Ambulatory Visit

## 2023-02-27 VITALS — BP 112/80 | HR 80 | Temp 98.0°F | Ht 63.0 in | Wt 234.3 lb

## 2023-02-27 DIAGNOSIS — L501 Idiopathic urticaria: Secondary | ICD-10-CM | POA: Diagnosis not present

## 2023-02-27 DIAGNOSIS — L508 Other urticaria: Secondary | ICD-10-CM | POA: Diagnosis not present

## 2023-02-27 NOTE — Patient Instructions (Addendum)
1. 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 - This is controlling your symptoms effectively.  - There are no red flags with long term use of Xolair.  - EpiPen is up to date.    2. Return in about 1 year (around 02/27/2024).   Please inform us of any Emergency Department visits, hospitalizations, or changes in symptoms. Call us before going to the ED for breathing or allergy symptoms since we might be able to fit you in for a sick visit. Feel free to contact us anytime with any questions, problems, or concerns.  It was a pleasure to see you again today! You newest addition is so adorable!!   Websites that have reliable patient information: 1. American Academy of Asthma, Allergy, and Immunology: www.aaaai.org 2. Food Allergy Research and Education (FARE): foodallergy.org 3. Mothers of Asthmatics: http://www.asthmacommunitynetwork.org 4. American College of Allergy, Asthma, and Immunology: www.acaai.org   COVID-19 Vaccine Information can be found at: PodExchange.nl For questions related to vaccine distribution or appointments, please email [email or web address] [email or web address] or call [phone].     "Like" [place] on Facebook and Instagram for our latest updates!        Make sure you are registered to vote! If you have moved or changed any of your contact information, you will need to get this updated before voting!  In some cases, you MAY be able to register to vote online: [email or web address]

## 2023-02-27 NOTE — Progress Notes (Signed)
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 - This is controlling your symptoms effectively.  - There are no red flags with long term use of Xolair.  - EpiPen is up to date.    2. Return in about 1 year (around 02/27/2024).    Subjective:   Morgan OSHIRO is a 37 y.o. female presenting today for follow up of  Chief Complaint  Patient presents with   Urticaria   Follow-up    Morgan Boyd has a history of the following: Patient Active Problem List   Diagnosis Date Noted   Elevated blood pressure reading without diagnosis of hypertension 01/08/2023   Status post repeat low transverse cesarean section 01/06/2023   History of prior pregnancy with IUGR newborn 12/23/2022   History of severe pre-eclampsia 12/23/2022   Palpitations 12/23/2022   Maternal obesity affecting pregnancy, antepartum--BMI 46 12/23/2022   Delivery by classical cesarean section 05/14/2021   Fibroids 04/23/2021   Vitamin D deficiency 04/23/2021   AMA (advanced maternal age) multigravida 35+ 04/20/2021    History obtained from: chart review and patient.  Morgan Boyd is a 37 y.o. female presenting for a follow up visit.  We last saw her in February 2022.  At that time, we continued with her monthly Xolair for management of her autoimmune urticaria.  She was pregnant at the time and we discussed the safety of Xolair during pregnancy.  She has since remained on Xolair which she gets around every 4 weeks.  Since the last visit, she has done well. She had a baby on March 18th who is rather adorable. She is out of work right now. Her older child was in the NICU for 8 months and is medically complex. He is now going to be two in July. She has therapy appointments 4 days per week. All of her specialists are at [facility]. She also has a 39yo and 39yo.  They are all doing great. Oldest is dealing with migraines.   Her husband is in the [employer] and teaches full time. She manages all of the four children at home. She was previously a Copy. Her husband is the band Animator for the high school [employer]).   Skin Symptom History: She remains on the Xolair. This is worse to prevent her urticarial outbreaks. She has not had any breakouts aside from two.  She takes cetirizine as needed. She does not need to take cetirizine daily.  Otherwise, there have been no changes to her past medical history, surgical history, family history, or social history.    Review of Systems  Constitutional: Negative.  Negative for chills, fever, malaise/fatigue and weight loss.  HENT: Negative.  Negative for congestion, ear discharge, ear pain, sinus pain and sore throat.   Eyes:  Negative for pain, discharge and redness.  Respiratory:  Negative for cough, sputum production, shortness of breath and wheezing.   Cardiovascular: Negative.  Negative for chest pain and palpitations.  Gastrointestinal:  Negative for abdominal pain, constipation, diarrhea, heartburn, nausea and vomiting.  Skin: Negative.  Negative for itching and rash.  Neurological:  Negative for dizziness and headaches.  Endo/Heme/Allergies:  Negative for environmental allergies. Does not bruise/bleed easily.       Objective:   Blood pressure 112/80, pulse 80, temperature 98 F (36.7 C),  temperature source Temporal, height 5\' 3"  (1.6 m), weight 234 lb 4.8 oz (106.3 kg), last menstrual period 04/22/2022, SpO2 96 %, unknown if currently breastfeeding. Body mass index is 41.5 kg/m.    Physical Exam Vitals reviewed.  Constitutional:      Appearance: She is well-developed.     Comments: Very lovely.   HENT:     Head: Normocephalic and atraumatic.     Right Ear: Tympanic membrane, ear canal and external ear normal.     Left Ear: Tympanic membrane  and ear canal normal.     Nose: No nasal deformity, septal deviation, mucosal edema or rhinorrhea.     Right Turbinates: Swollen and pale.     Left Turbinates: Swollen and pale.     Right Sinus: No maxillary sinus tenderness or frontal sinus tenderness.     Left Sinus: No maxillary sinus tenderness or frontal sinus tenderness.     Mouth/Throat:     Mouth: Mucous membranes are not pale and not dry.     Pharynx: Uvula midline.  Eyes:     General:        Right eye: No discharge.        Left eye: No discharge.     Conjunctiva/sclera: Conjunctivae normal.     Right eye: Right conjunctiva is not injected. No chemosis.    Left eye: Left conjunctiva is not injected. No chemosis.    Pupils: Pupils are equal, round, and reactive to light.  Cardiovascular:     Rate and Rhythm: Normal rate and regular rhythm.     Heart sounds: Normal heart sounds.  Pulmonary:     Effort: Pulmonary effort is normal. No tachypnea, accessory muscle usage or respiratory distress.     Breath sounds: Normal breath sounds. No wheezing, rhonchi or rales.  Chest:     Chest wall: No tenderness.  Lymphadenopathy:     Cervical: No cervical adenopathy.  Skin:    Coloration: Skin is not pale.     Findings: No abrasion, erythema, petechiae or rash. Rash is not papular, urticarial or vesicular.  Neurological:     Mental Status: She is alert.  Psychiatric:        Behavior: Behavior is cooperative.      Diagnostic studies: none     Malachi Bonds, MD  [facility]

## 2023-03-06 NOTE — Therapy (Signed)
OUTPATIENT PHYSICAL THERAPY FEMALE PELVIC EVALUATION   Patient Name: Morgan Boyd MRN: [id number] DOB:1986/04/15, 37 y.o., female Today's Date: 03/07/2023  END OF SESSION:  PT End of Session - 03/07/23 0941     Visit Number 1    Date for PT Re-Evaluation 05/30/23    Authorization Type [employer]    PT Start Time 0940    PT Stop Time 1015    PT Time Calculation (min) 35 min    Activity Tolerance Patient tolerated treatment well    Behavior During Therapy WFL for tasks assessed/performed             Past Medical History:  Diagnosis Date   Allergy    Autoimmune urticaria    Bilateral ovarian cysts    Bronchitis    Fibroid    Gestational hypertension    Vitamin D deficiency    Past Surgical History:  Procedure Laterality Date   CESAREAN SECTION N/A 05/14/2021   Procedure: CESAREAN SECTION;  Surgeon: Hoover Browns, MD;  Location: [facility] OR;  Service: Obstetrics;  Laterality: N/A;   CESAREAN SECTION N/A 01/06/2023   Procedure: CESAREAN SECTION;  Surgeon: Hoover Browns, MD;  Location: [facility] LD ORS;  Service: Obstetrics;  Laterality: N/A;   DILATION AND CURETTAGE OF UTERUS  10/21/2008   IUD REMOVAL     TONSILLECTOMY     Patient Active Problem List   Diagnosis Date Noted   Elevated blood pressure reading without diagnosis of hypertension 01/08/2023   Status post repeat low transverse cesarean section 01/06/2023   History of prior pregnancy with IUGR newborn 12/23/2022   History of severe pre-eclampsia 12/23/2022   Palpitations 12/23/2022   Maternal obesity affecting pregnancy, antepartum--BMI 46 12/23/2022   Delivery by classical cesarean section 05/14/2021   Fibroids 04/23/2021   Vitamin D deficiency 04/23/2021   AMA (advanced maternal age) multigravida 35+ 04/20/2021    PCP: Chyrel Masson  REFERRING PROVIDER:  Hands, Kylie, NP     REFERRING DIAG: N39.3 (ICD-10-CM) - Stress incontinence (female) (female)   THERAPY DIAG:  Muscle weakness (generalized)  Other  low back pain  Generalized abdominal pain  Pelvic pain  Rationale for Evaluation and Treatment: Rehabilitation  ONSET DATE: 1/24  SUBJECTIVE:                                                                                                                                                                                           SUBJECTIVE STATEMENT: C-section 01/06/23; patient started to have urinary incontinence with her pregnancy. I might have diastasis.   PAIN:  Are you having pain? Yes NPRS scale: 3/10 Pain location: c-section  Pain type: burning Pain description: intermittent   Aggravating factors: lifting, rolling in bed Relieving factors: rest PAIN:  Are you having pain? Yes: NPRS scale: 7/10 Pain location: low back Pain description: intermittent, achy Aggravating factors: carrying infant with wrap, on feet prolonged time Relieving factors: stretch  PRECAUTIONS: None  WEIGHT BEARING RESTRICTIONS: No  FALLS:  Has patient fallen in last 6 months? No  LIVING ENVIRONMENT: Lives with: lives with their family   OCCUPATION: stay home mom  PLOF: Independent  PATIENT GOALS: reduce leakage, go through the day without leakage  PERTINENT HISTORY:  Cesarean section x2; history of fibroid and ovarian cysts   BOWEL MOVEMENT:no issues   URINATION: Pain with urination: No Fully empty bladder: Yes:   Stream: Strong, feels like a fire hose Urgency: Yes:   Frequency: average Leakage: Urge to void, Walking to the bathroom, Coughing, Sneezing, and Laughing Pads: Yes: sometimes   INTERCOURSE: not active at this time  PREGNANCY: Vaginal deliveries 2 Tearing Yes: second degree tear  C-section deliveries 2 Currently pregnant No  PROLAPSE: None   OBJECTIVE:   DIAGNOSTIC FINDINGS:  none   COGNITION: Overall cognitive status: Within functional limits for tasks assessed      POSTURE: rounded shoulders, forward head, and increased lumbar lordosis  PELVIC  ALIGNMENT: left ilium rotated posteriorly  LUMBARAROM/PROM:  A/PROM A/PROM  eval  Flexion Decreased by 25%  Extension Decreased by 25%  Right lateral flexion Decreased by 25%  Left lateral flexion Decreased by 25%  Right rotation Decreased by 50%  Left rotation Decreased by 50%   (Blank rows = not tested)  LOWER EXTREMITY ROM: bilateral hip ROM is full   LOWER EXTREMITY MMT:  MMT Right eval Left eval  Hip flexion 4/5 4/5  Hip extension 4/5 4/5  Hip abduction 3/5 3/5  Hip adduction 4/5 4/5   PALPATION:   General  tenderness throughout the abdomen, lumbar paraspinals; decreased movement of T8-L5                External Perineal Exam dryness                             Internal Pelvic Floor tenderness located on the levator ani, obturator internist, sides of the urethra and bladder, decreased movement of the anterior cervix  Patient confirms identification and approves PT to assess internal pelvic floor and treatment Yes  PELVIC MMT:   MMT eval  Vaginal 2/5  Diastasis Recti 6 fingers width above and below with no tension   (Blank rows = not tested)        TONE: increased  PROLAPSE: Anterior wall above the introitus  TODAY'S TREATMENT:                                                                                                                              DATE: 03/07/23  EVAL See below   PATIENT  EDUCATION:  Education details: educated patient on using coconut oil for vaginal dryness, educated patient on c-section scar massage, educated patient on abdominal binder for support Person educated: Patient Education method: Programmer, multimedia, Facilities manager, Actor cues, Verbal cues, and Handouts Education comprehension: verbalized understanding, returned demonstration, verbal cues required, tactile cues required, and needs further education  HOME EXERCISE PROGRAM: Access Code: [email or web address]   ASSESSMENT:  CLINICAL IMPRESSION: Patient is a 37 y.o. female  who was seen  today for physical therapy evaluation and treatment for stress incontinence. Patient reports her urinary leakage began during her last pregnancy in the 7 month. She had her infant on 01/06/23 by c-section. She reports lower abdominal pain at level 3/10 with lifting and turning in bed. She has lumbar pain at level 7/10 with carrying her infant, standing long periods of time. She has limited lumbar ROM. Her diastasis recti is 6 finger width above and below umbilicus with no tension. She leaks urine with urge to void, walking to the bathroom, coughing, sneezing, and laughing. Pelvic floor strength is 2/5. She has tenderness located on the levator ani, obturator internist, sides of bladder and urethra. She has weakness in bilateral hips. Patient would benefit from skilled therapy to improve strength and coordination while reducing pain and urinary leakage.   OBJECTIVE IMPAIRMENTS: decreased activity tolerance, decreased coordination, decreased endurance, decreased ROM, decreased strength, increased fascial restrictions, increased muscle spasms, and pain.   ACTIVITY LIMITATIONS: carrying, lifting, standing, transfers, continence, and caring for others  PARTICIPATION LIMITATIONS: meal prep, cleaning, laundry, driving, shopping, and community activity  PERSONAL FACTORS: Time since onset of injury/illness/exacerbation and 1-2 comorbidities: Cesarean section x2; history of fibroid and ovarian cysts  are also affecting patient's functional outcome.   REHAB POTENTIAL: Excellent  CLINICAL DECISION MAKING: Evolving/moderate complexity  EVALUATION COMPLEXITY: Moderate   GOALS: Goals reviewed with patient? Yes  SHORT TERM GOALS: Target date: 04/04/23  Patient independent with initial HEP for pelvic floor and core.  Baseline: Goal status: INITIAL  2.  Patient is able to perform a circular contraction with the pelvic floor and slight lift.  Baseline:  Goal status: INITIAL  3.  Minimal tenderness in the  pelvic floor muscles due to reduction of trigger points.  Baseline:  Goal status: INITIAL  4.  Patient has increased lumbar ROM by 25% due to improved tissue and joint mobility.  Baseline:  Goal status: INITIAL   LONG TERM GOALS: Target date: 05/30/23  Patient independent with advanced HEP for core, pelvic and hip strength.  Baseline:  Goal status: INITIAL  2.  Patient pelvic floor strength >/= 3/5 so her urinary leakage is </- 75% better.  Baseline:  Goal status: INITIAL  3.  Patient diastasis recti reduce </= 3 finger width with good tension.  Baseline:  Goal status: INITIAL  4.  Low back pain decreased >/= 75% due to increased mobility and reduction of trigger points.  Baseline:  Goal status: INITIAL  5.  Patient is able to carry her infant with lumbar pain decreased </= 75%.  Baseline:  Goal status: INITIAL    PLAN:  PT FREQUENCY: 1-2x/week  PT DURATION: 12 weeks  PLANNED INTERVENTIONS: Therapeutic exercises, Therapeutic activity, Neuromuscular re-education, Patient/Family education, Joint mobilization, Dry Needling, Electrical stimulation, Spinal mobilization, Cryotherapy, Moist heat, Taping, Ultrasound, Biofeedback, and Manual therapy  PLAN FOR NEXT SESSION: manual work to lumbar and rib cage, tape up diastasis, pelvic floor drop, hip stretches, manual work to -c-section scar, go over vaginal lubricants   Eulis Foster, PT 03/07/23  12:41 PM

## 2023-03-07 ENCOUNTER — Encounter: Payer: Self-pay | Admitting: Physical Therapy

## 2023-03-07 ENCOUNTER — Ambulatory Visit: Attending: Obstetrics and Gynecology | Admitting: Physical Therapy

## 2023-03-07 ENCOUNTER — Other Ambulatory Visit: Payer: Self-pay

## 2023-03-07 DIAGNOSIS — M5459 Other low back pain: Secondary | ICD-10-CM | POA: Diagnosis present

## 2023-03-07 DIAGNOSIS — M6281 Muscle weakness (generalized): Secondary | ICD-10-CM | POA: Diagnosis present

## 2023-03-07 DIAGNOSIS — R252 Cramp and spasm: Secondary | ICD-10-CM | POA: Diagnosis present

## 2023-03-07 DIAGNOSIS — R102 Pelvic and perineal pain: Secondary | ICD-10-CM | POA: Diagnosis present

## 2023-03-07 DIAGNOSIS — R1084 Generalized abdominal pain: Secondary | ICD-10-CM | POA: Insufficient documentation

## 2023-03-12 ENCOUNTER — Encounter: Payer: Self-pay | Admitting: Physical Therapy

## 2023-03-12 ENCOUNTER — Ambulatory Visit: Admitting: Physical Therapy

## 2023-03-12 DIAGNOSIS — R102 Pelvic and perineal pain: Secondary | ICD-10-CM

## 2023-03-12 DIAGNOSIS — M5459 Other low back pain: Secondary | ICD-10-CM

## 2023-03-12 DIAGNOSIS — M6281 Muscle weakness (generalized): Secondary | ICD-10-CM

## 2023-03-12 DIAGNOSIS — R252 Cramp and spasm: Secondary | ICD-10-CM

## 2023-03-12 DIAGNOSIS — R1084 Generalized abdominal pain: Secondary | ICD-10-CM

## 2023-03-12 NOTE — Therapy (Signed)
OUTPATIENT PHYSICAL THERAPY FEMALE PELVIC TREATMENT   Patient Name: Morgan Boyd MRN: [id number] DOB:1985-12-30, 37 y.o., female Today's Date: 03/12/2023  END OF SESSION:  PT End of Session - 03/12/23 1017     Visit Number 2    Date for PT Re-Evaluation 05/30/23    Authorization Type [employer]    PT Start Time 1015    PT Stop Time 1055    PT Time Calculation (min) 40 min    Activity Tolerance Patient tolerated treatment well    Behavior During Therapy WFL for tasks assessed/performed             Past Medical History:  Diagnosis Date   Allergy    Autoimmune urticaria    Bilateral ovarian cysts    Bronchitis    Fibroid    Gestational hypertension    Vitamin D deficiency    Past Surgical History:  Procedure Laterality Date   CESAREAN SECTION N/A 05/14/2021   Procedure: CESAREAN SECTION;  Surgeon: Hoover Browns, MD;  Location: [facility] OR;  Service: Obstetrics;  Laterality: N/A;   CESAREAN SECTION N/A 01/06/2023   Procedure: CESAREAN SECTION;  Surgeon: Hoover Browns, MD;  Location: [facility];  Service: Obstetrics;  Laterality: N/A;   DILATION AND CURETTAGE OF UTERUS  10/21/2008   IUD REMOVAL     TONSILLECTOMY     Patient Active Problem List   Diagnosis Date Noted   Elevated blood pressure reading without diagnosis of hypertension 01/08/2023   Status post repeat low transverse cesarean section 01/06/2023   History of prior pregnancy with IUGR newborn 12/23/2022   History of severe pre-eclampsia 12/23/2022   Palpitations 12/23/2022   Maternal obesity affecting pregnancy, antepartum--BMI 46 12/23/2022   Delivery by classical cesarean section 05/14/2021   Fibroids 04/23/2021   Vitamin D deficiency 04/23/2021   AMA (advanced maternal age) multigravida 35+ 04/20/2021    PCP: Chyrel Masson  REFERRING PROVIDER:  Hands, Kylie, NP     REFERRING DIAG: N39.3 (ICD-10-CM) - Stress incontinence (female) (female)   THERAPY DIAG:  Muscle weakness (generalized)  Other  low back pain  Generalized abdominal pain  Pelvic pain  Cramp and spasm  Rationale for Evaluation and Treatment: Rehabilitation  ONSET DATE: 1/24  SUBJECTIVE:                                                                                                                                                                                           SUBJECTIVE STATEMENT: Look forward to therapy. Coconut oil is helping.   PAIN:  Are you having pain? Yes NPRS scale: 3/10 Pain location: c-section  Pain type:  burning Pain description: intermittent   Aggravating factors: lifting, rolling in bed Relieving factors: rest PAIN:  Are you having pain? Yes: NPRS scale: 3/10 Pain location: low back Pain description: intermittent, achy Aggravating factors: carrying infant with wrap, on feet prolonged time Relieving factors: stretch  PRECAUTIONS: None  WEIGHT BEARING RESTRICTIONS: No  FALLS:  Has patient fallen in last 6 months? No  LIVING ENVIRONMENT: Lives with: lives with their family   OCCUPATION: stay home mom  PLOF: Independent  PATIENT GOALS: reduce leakage, go through the day without leakage  PERTINENT HISTORY:  Cesarean section x2; history of fibroid and ovarian cysts   BOWEL MOVEMENT:no issues   URINATION: Pain with urination: No Fully empty bladder: Yes:   Stream: Strong, feels like a fire hose Urgency: Yes:   Frequency: average Leakage: Urge to void, Walking to the bathroom, Coughing, Sneezing, and Laughing Pads: Yes: sometimes   INTERCOURSE: not active at this time  PREGNANCY: Vaginal deliveries 2 Tearing Yes: second degree tear  C-section deliveries 2 Currently pregnant No  PROLAPSE: None   OBJECTIVE:   DIAGNOSTIC FINDINGS:  none   COGNITION: Overall cognitive status: Within functional limits for tasks assessed      POSTURE: rounded shoulders, forward head, and increased lumbar lordosis  PELVIC ALIGNMENT: left ilium rotated  posteriorly  LUMBARAROM/PROM:  A/PROM A/PROM  eval  Flexion Decreased by 25%  Extension Decreased by 25%  Right lateral flexion Decreased by 25%  Left lateral flexion Decreased by 25%  Right rotation Decreased by 50%  Left rotation Decreased by 50%   (Blank rows = not tested)  LOWER EXTREMITY ROM: bilateral hip ROM is full   LOWER EXTREMITY MMT:  MMT Right eval Left eval  Hip flexion 4/5 4/5  Hip extension 4/5 4/5  Hip abduction 3/5 3/5  Hip adduction 4/5 4/5   PALPATION:   General  tenderness throughout the abdomen, lumbar paraspinals; decreased movement of T8-L5                External Perineal Exam dryness                             Internal Pelvic Floor tenderness located on the levator ani, obturator internist, sides of the urethra and bladder, decreased movement of the anterior cervix  Patient confirms identification and approves PT to assess internal pelvic floor and treatment Yes  PELVIC MMT:   MMT eval  Vaginal 2/5  Diastasis Recti 6 fingers width above and below with no tension   (Blank rows = not tested)        TONE: increased  PROLAPSE: Anterior wall above the introitus  TODAY'S TREATMENT:   03/12/23 Manual: Soft tissue mobilization: To assess for dry needling Manual work to the lumbar paraspinals, along the quadratus, along the lower ribs and gluteals Myofascial release: Quadruped pulling skin from back to abdominals to release the area Fascial release of the lower abdomen moving in different directions Spinal mobilization: Manual mobilization to T10-L5 grade 3 Lower rib cage mobilization Trigger Point Dry-Needling  Treatment instructions: Expect mild to moderate muscle soreness. S/S of pneumothorax if dry needled over a lung field, and to seek immediate medical attention should they occur. Patient verbalized understanding of these instructions and education.  Patient Consent Given: Yes Education handout provided: Yes Muscles treated:  lumbar multifidi, quadratus Electrical stimulation performed: No Parameters: N/A Treatment response/outcome: elongation of muscle and trigger point release Taping Kinesiotape to correct diastasis  Recti wth overlapping diagonals Exercises: Stretches/mobility: Thread the needling bil. 10 x Marjo Bicker pose hold 30 sec 2 x                                                                                                                               DATE: 03/07/23  EVAL See below  PATIENT EDUCATION: 03/12/23 Education details: [id number], information on dry needling Person educated: Patient Education method: Explanation, Demonstration, Tactile cues, Verbal cues, and Handouts Education comprehension: verbalized understanding, returned demonstration, verbal cues required, tactile cues required, and needs further education   HOME EXERCISE PROGRAM: 03/12/23 Access Code: [email or web address] URL: [email or web address] Date: 03/12/2023 Prepared by: Eulis Foster  Exercises - - Child's Pose with Thread the Needle  - 1 x daily - 7 x weekly - 3 sets - 10 reps - Child's Pose Stretch  - 1 x daily - 7 x weekly - 1 sets - 2 reps - 30 sec hold - Quadruped Full Range Thoracic Rotation with Reach  - 1 x daily - 7 x weekly - 1 sets - 10 reps  Patient Education - Trigger Point Dry Needling Access Code: [email or web address]   ASSESSMENT:  CLINICAL IMPRESSION: Patient is a 37 y.o. female  who was seen today for physical therapy  treatment for stress incontinence. Patient diastasis reduced after the manual work. Patient back pain reduce after the manual work. She is not able to contract her abdominal yet. She responds well to the kinesio taping. She had increased movement of the lumbar spine. Patient would benefit from skilled therapy to improve strength and coordination while reducing pain and urinary leakage.   OBJECTIVE IMPAIRMENTS: decreased activity tolerance, decreased coordination, decreased endurance, decreased  ROM, decreased strength, increased fascial restrictions, increased muscle spasms, and pain.   ACTIVITY LIMITATIONS: carrying, lifting, standing, transfers, continence, and caring for others  PARTICIPATION LIMITATIONS: meal prep, cleaning, laundry, driving, shopping, and community activity  PERSONAL FACTORS: Time since onset of injury/illness/exacerbation and 1-2 comorbidities: Cesarean section x2; history of fibroid and ovarian cysts  are also affecting patient's functional outcome.   REHAB POTENTIAL: Excellent  CLINICAL DECISION MAKING: Evolving/moderate complexity  EVALUATION COMPLEXITY: Moderate   GOALS: Goals reviewed with patient? Yes  SHORT TERM GOALS: Target date: 04/04/23  Patient independent with initial HEP for pelvic floor and core.  Baseline: Goal status: INITIAL  2.  Patient is able to perform a circular contraction with the pelvic floor and slight lift.  Baseline:  Goal status: INITIAL  3.  Minimal tenderness in the pelvic floor muscles due to reduction of trigger points.  Baseline:  Goal status: INITIAL  4.  Patient has increased lumbar ROM by 25% due to improved tissue and joint mobility.  Baseline:  Goal status: INITIAL   LONG TERM GOALS: Target date: 05/30/23  Patient independent with advanced HEP for core, pelvic and hip strength.  Baseline:  Goal status: INITIAL  2.  Patient pelvic floor strength >/= 3/5 so her urinary  leakage is </- 75% better.  Baseline:  Goal status: INITIAL  3.  Patient diastasis recti reduce </= 3 finger width with good tension.  Baseline:  Goal status: INITIAL  4.  Low back pain decreased >/= 75% due to increased mobility and reduction of trigger points.  Baseline:  Goal status: INITIAL  5.  Patient is able to carry her infant with lumbar pain decreased </= 75%.  Baseline:  Goal status: INITIAL    PLAN:  PT FREQUENCY: 1-2x/week  PT DURATION: 12 weeks  PLANNED INTERVENTIONS: Therapeutic exercises, Therapeutic  activity, Neuromuscular re-education, Patient/Family education, Joint mobilization, Dry Needling, Electrical stimulation, Spinal mobilization, Cryotherapy, Moist heat, Taping, Ultrasound, Biofeedback, and Manual therapy  PLAN FOR NEXT SESSION: manual work to lumbar and rib cage, tape up diastasis, pelvic floor drop, hip stretches, manual work to -c-section scar, go over vaginal lubricants   Eulis Foster, PT 03/12/23 10:17 AM

## 2023-03-19 ENCOUNTER — Encounter: Payer: Self-pay | Admitting: Physical Therapy

## 2023-03-19 ENCOUNTER — Ambulatory Visit: Admitting: Physical Therapy

## 2023-03-19 DIAGNOSIS — M5459 Other low back pain: Secondary | ICD-10-CM

## 2023-03-19 DIAGNOSIS — R252 Cramp and spasm: Secondary | ICD-10-CM

## 2023-03-19 DIAGNOSIS — M6281 Muscle weakness (generalized): Secondary | ICD-10-CM | POA: Diagnosis not present

## 2023-03-19 DIAGNOSIS — R102 Pelvic and perineal pain: Secondary | ICD-10-CM

## 2023-03-19 DIAGNOSIS — R1084 Generalized abdominal pain: Secondary | ICD-10-CM

## 2023-03-19 NOTE — Therapy (Signed)
OUTPATIENT PHYSICAL THERAPY FEMALE PELVIC TREATMENT   Patient Name: Morgan Boyd MRN: [id number] DOB:[birth year], 37 y.o., female Today's Date: 03/19/2023  END OF SESSION:  PT End of Session - 03/19/23 0936     Visit Number 3    Date for PT Re-Evaluation 05/30/23    Authorization Type [employer]    PT Start Time 0930    PT Stop Time 1010    PT Time Calculation (min) 40 min    Activity Tolerance Patient tolerated treatment well    Behavior During Therapy WFL for tasks assessed/performed             Past Medical History:  Diagnosis Date   Allergy    Autoimmune urticaria    Bilateral ovarian cysts    Bronchitis    Fibroid    Gestational hypertension    Vitamin D deficiency    Past Surgical History:  Procedure Laterality Date   CESAREAN SECTION N/A 05/14/2021   Procedure: CESAREAN SECTION;  Surgeon: Hoover Browns, MD;  Location: [facility] OR;  Service: Obstetrics;  Laterality: N/A;   CESAREAN SECTION N/A 01/06/2023   Procedure: CESAREAN SECTION;  Surgeon: Hoover Browns, MD;  Location: [facility] LD ORS;  Service: Obstetrics;  Laterality: N/A;   DILATION AND CURETTAGE OF UTERUS  10/21/2008   IUD REMOVAL     TONSILLECTOMY     Patient Active Problem List   Diagnosis Date Noted   Elevated blood pressure reading without diagnosis of hypertension 01/08/2023   Status post repeat low transverse cesarean section 01/06/2023   History of prior pregnancy with IUGR newborn 12/23/2022   History of severe pre-eclampsia 12/23/2022   Palpitations 12/23/2022   Maternal obesity affecting pregnancy, antepartum--BMI 46 12/23/2022   Delivery by classical cesarean section 05/14/2021   Fibroids 04/23/2021   Vitamin D deficiency 04/23/2021   AMA (advanced maternal age) multigravida 35+ 04/20/2021    PCP: Chyrel Masson  REFERRING PROVIDER:  Hands, Kylie, NP     REFERRING DIAG: N39.3 (ICD-10-CM) - Stress incontinence (female) (female)   THERAPY DIAG:  Muscle weakness (generalized)  Other  low back pain  Generalized abdominal pain  Pelvic pain  Cramp and spasm  Rationale for Evaluation and Treatment: Rehabilitation  ONSET DATE: 1/24  SUBJECTIVE:                                                                                                                                                                                           SUBJECTIVE STATEMENT: The taping of the abdominals helped. I felt more stable. Still leaking urine.    PAIN:  Are you having pain? Yes NPRS scale: 3/10  Pain location: c-section  Pain type: burning Pain description: intermittent   Aggravating factors: lifting, rolling in bed Relieving factors: rest PAIN:  Are you having pain? Yes: NPRS scale: 3/10 Pain location: low back Pain description: intermittent, achy Aggravating factors: carrying infant with wrap, on feet prolonged time Relieving factors: stretch  PRECAUTIONS: None  WEIGHT BEARING RESTRICTIONS: No  FALLS:  Has patient fallen in last 6 months? No  LIVING ENVIRONMENT: Lives with: lives with their family   OCCUPATION: stay home mom  PLOF: Independent  PATIENT GOALS: reduce leakage, go through the day without leakage  PERTINENT HISTORY:  Cesarean section x2; history of fibroid and ovarian cysts   BOWEL MOVEMENT:no issues   URINATION: Pain with urination: No Fully empty bladder: Yes:   Stream: Strong, feels like a fire hose Urgency: Yes:   Frequency: average Leakage: Urge to void, Walking to the bathroom, Coughing, Sneezing, and Laughing Pads: Yes: sometimes   INTERCOURSE: not active at this time  PREGNANCY: Vaginal deliveries 2 Tearing Yes: second degree tear  C-section deliveries 2 Currently pregnant No  PROLAPSE: None   OBJECTIVE:   DIAGNOSTIC FINDINGS:  none   COGNITION: Overall cognitive status: Within functional limits for tasks assessed      POSTURE: rounded shoulders, forward head, and increased lumbar lordosis  PELVIC  ALIGNMENT: left ilium rotated posteriorly  LUMBARAROM/PROM:  A/PROM A/PROM  eval  Flexion Decreased by 25%  Extension Decreased by 25%  Right lateral flexion Decreased by 25%  Left lateral flexion Decreased by 25%  Right rotation Decreased by 50%  Left rotation Decreased by 50%   (Blank rows = not tested)  LOWER EXTREMITY ROM: bilateral hip ROM is full   LOWER EXTREMITY MMT:  MMT Right eval Left eval  Hip flexion 4/5 4/5  Hip extension 4/5 4/5  Hip abduction 3/5 3/5  Hip adduction 4/5 4/5   PALPATION:   General  tenderness throughout the abdomen, lumbar paraspinals; decreased movement of T8-L5                External Perineal Exam dryness                             Internal Pelvic Floor tenderness located on the levator ani, obturator internist, sides of the urethra and bladder, decreased movement of the anterior cervix  Patient confirms identification and approves PT to assess internal pelvic floor and treatment Yes  PELVIC MMT:   MMT eval  Vaginal 2/5  Diastasis Recti 6 fingers width above and below with no tension   (Blank rows = not tested)        TONE: increased  PROLAPSE: Anterior wall above the introitus  TODAY'S TREATMENT:  03/19/23 Manual: Taping Kinesiotape to correct diastasis Recti wth overlapping diagonals Soft tissue mobilization: To assess for dry needling Manual work to the gluteals and piriformis and rectus Scar tissue mobilization: Scar mobilization and educated patient on how to perform at home Trigger Point Dry-Needling  Treatment instructions: Expect mild to moderate muscle soreness. S/S of pneumothorax if dry needled over a lung field, and to seek immediate medical attention should they occur. Patient verbalized understanding of these instructions and education.  Patient Consent Given: Yes Education handout provided: Yes Muscles treated: gluteus medius, gluteus maximus, piriformis, rectus abdominus, c-section scar Electrical  stimulation performed: No Parameters: N/A Treatment response/outcome: elongation of muscle and trigger point release Neuromuscular re-education: Core facilitation: Abdominal contraction with tactile dues in supine and  standing Exercises: Stretches/mobility: Sitting piriformis holding 30 sec each   03/12/23 Manual: Soft tissue mobilization: To assess for dry needling Manual work to the lumbar paraspinals, along the quadratus, along the lower ribs and gluteals Myofascial release: Quadruped pulling skin from back to abdominals to release the area Fascial release of the lower abdomen moving in different directions Spinal mobilization: Manual mobilization to T10-L5 grade 3 Lower rib cage mobilization Trigger Point Dry-Needling  Treatment instructions: Expect mild to moderate muscle soreness. S/S of pneumothorax if dry needled over a lung field, and to seek immediate medical attention should they occur. Patient verbalized understanding of these instructions and education.  Patient Consent Given: Yes Education handout provided: Yes Muscles treated: lumbar multifidi, quadratus Electrical stimulation performed: No Parameters: N/A Treatment response/outcome: elongation of muscle and trigger point release Taping Kinesiotape to correct diastasis Recti wth overlapping diagonals Exercises: Stretches/mobility: Thread the needling bil. 10 x Marjo Bicker pose hold 30 sec 2 x                                                                                                                               DATE: 03/07/23  EVAL See below  PATIENT EDUCATION: 03/12/23 Education details: [id number], information on dry needling Person educated: Patient Education method: Explanation, Demonstration, Tactile cues, Verbal cues, and Handouts Education comprehension: verbalized understanding, returned demonstration, verbal cues required, tactile cues required, and needs further education   HOME EXERCISE  PROGRAM: 03/12/23 Access Code: [email or web address] URL: [email or web address] Date: 03/12/2023 Prepared by: Eulis Foster  Exercises - - Child's Pose with Thread the Needle  - 1 x daily - 7 x weekly - 3 sets - 10 reps - Child's Pose Stretch  - 1 x daily - 7 x weekly - 1 sets - 2 reps - 30 sec hold - Quadruped Full Range Thoracic Rotation with Reach  - 1 x daily - 7 x weekly - 1 sets - 10 reps  Patient Education - Trigger Point Dry Needling  ASSESSMENT:  CLINICAL IMPRESSION: Patient is a 37 y.o. female  who was seen today for physical therapy  treatment for stress incontinence.Patient scar had increased mobility after the manual work. She was able to contract her lower abdominals. The kinesio tape has been helping to give her support.  Patient would benefit from skilled therapy to improve strength and coordination while reducing pain and urinary leakage.   OBJECTIVE IMPAIRMENTS: decreased activity tolerance, decreased coordination, decreased endurance, decreased ROM, decreased strength, increased fascial restrictions, increased muscle spasms, and pain.   ACTIVITY LIMITATIONS: carrying, lifting, standing, transfers, continence, and caring for others  PARTICIPATION LIMITATIONS: meal prep, cleaning, laundry, driving, shopping, and community activity  PERSONAL FACTORS: Time since onset of injury/illness/exacerbation and 1-2 comorbidities: Cesarean section x2; history of fibroid and ovarian cysts  are also affecting patient's functional outcome.   REHAB POTENTIAL: Excellent  CLINICAL DECISION MAKING: Evolving/moderate complexity  EVALUATION COMPLEXITY: Moderate   GOALS: Goals reviewed with patient? Yes  SHORT  TERM GOALS: Target date: 04/04/23  Patient independent with initial HEP for pelvic floor and core.  Baseline: Goal status: INITIAL  2.  Patient is able to perform a circular contraction with the pelvic floor and slight lift.  Baseline:  Goal status: INITIAL  3.  Minimal  tenderness in the pelvic floor muscles due to reduction of trigger points.  Baseline:  Goal status: INITIAL  4.  Patient has increased lumbar ROM by 25% due to improved tissue and joint mobility.  Baseline:  Goal status: INITIAL   LONG TERM GOALS: Target date: 05/30/23  Patient independent with advanced HEP for core, pelvic and hip strength.  Baseline:  Goal status: INITIAL  2.  Patient pelvic floor strength >/= 3/5 so her urinary leakage is </- 75% better.  Baseline:  Goal status: INITIAL  3.  Patient diastasis recti reduce </= 3 finger width with good tension.  Baseline:  Goal status: INITIAL  4.  Low back pain decreased >/= 75% due to increased mobility and reduction of trigger points.  Baseline:  Goal status: INITIAL  5.  Patient is able to carry her infant with lumbar pain decreased </= 75%.  Baseline:  Goal status: INITIAL    PLAN:  PT FREQUENCY: 1-2x/week  PT DURATION: 12 weeks  PLANNED INTERVENTIONS: Therapeutic exercises, Therapeutic activity, Neuromuscular re-education, Patient/Family education, Joint mobilization, Dry Needling, Electrical stimulation, Spinal mobilization, Cryotherapy, Moist heat, Taping, Ultrasound, Biofeedback, and Manual therapy  PLAN FOR NEXT SESSION: manual work to lumbar and rib cage, tape up diastasis, pelvic floor drop, hip stretches, manual work to -c-section scar, go over vaginal lubricants, abdominal contraction, gluteal contraction   Eulis Foster, PT 03/19/23 10:20 AM

## 2023-03-20 ENCOUNTER — Other Ambulatory Visit: Payer: Self-pay | Admitting: Allergy & Immunology

## 2023-03-21 ENCOUNTER — Encounter: Payer: Self-pay | Admitting: Physical Therapy

## 2023-03-21 ENCOUNTER — Ambulatory Visit: Admitting: Physical Therapy

## 2023-03-21 DIAGNOSIS — M5459 Other low back pain: Secondary | ICD-10-CM

## 2023-03-21 DIAGNOSIS — R252 Cramp and spasm: Secondary | ICD-10-CM

## 2023-03-21 DIAGNOSIS — R102 Pelvic and perineal pain: Secondary | ICD-10-CM

## 2023-03-21 DIAGNOSIS — R1084 Generalized abdominal pain: Secondary | ICD-10-CM

## 2023-03-21 DIAGNOSIS — M6281 Muscle weakness (generalized): Secondary | ICD-10-CM | POA: Diagnosis not present

## 2023-03-21 NOTE — Therapy (Signed)
OUTPATIENT PHYSICAL THERAPY FEMALE PELVIC TREATMENT   Patient Name: Morgan Boyd MRN: [id number] DOB:03-11-1986, 37 y.o., female Today's Date: 03/21/2023  END OF SESSION:  PT End of Session - 03/21/23 0936     Visit Number 4    Date for PT Re-Evaluation 05/30/23    Authorization Type [employer]    PT Start Time 0930    PT Stop Time 1010    PT Time Calculation (min) 40 min    Activity Tolerance Patient tolerated treatment well    Behavior During Therapy WFL for tasks assessed/performed             Past Medical History:  Diagnosis Date   Allergy    Autoimmune urticaria    Bilateral ovarian cysts    Bronchitis    Fibroid    Gestational hypertension    Vitamin D deficiency    Past Surgical History:  Procedure Laterality Date   CESAREAN SECTION N/A 05/14/2021   Procedure: CESAREAN SECTION;  Surgeon: Hoover Browns, MD;  Location: [facility] OR;  Service: Obstetrics;  Laterality: N/A;   CESAREAN SECTION N/A 01/06/2023   Procedure: CESAREAN SECTION;  Surgeon: Hoover Browns, MD;  Location: [facility] LD [facility];  Service: Obstetrics;  Laterality: N/A;   DILATION AND CURETTAGE OF UTERUS  10/21/2008   IUD REMOVAL     TONSILLECTOMY     Patient Active Problem List   Diagnosis Date Noted   Elevated blood pressure reading without diagnosis of hypertension 01/08/2023   Status post repeat low transverse cesarean section 01/06/2023   History of prior pregnancy with IUGR newborn 12/23/2022   History of severe pre-eclampsia 12/23/2022   Palpitations 12/23/2022   Maternal obesity affecting pregnancy, antepartum--BMI 46 12/23/2022   Delivery by classical cesarean section 05/14/2021   Fibroids 04/23/2021   Vitamin D deficiency 04/23/2021   AMA (advanced maternal age) multigravida 35+ 04/20/2021    PCP: Chyrel Masson  REFERRING PROVIDER:  Hands, Kylie, NP     REFERRING DIAG: N39.3 (ICD-10-CM) - Stress incontinence (female) (female)   THERAPY DIAG:  Muscle weakness (generalized)  Other  low back pain  Generalized abdominal pain  Pelvic pain  Cramp and spasm  Rationale for Evaluation and Treatment: Rehabilitation  ONSET DATE: 1/24  SUBJECTIVE:                                                                                                                                                                                           SUBJECTIVE STATEMENT: I have ordered the belly band. My leg is feeling better. Lower back tension is not as bad. I feel a strain when I lift something  heavy. The scar feels better.    PAIN:  Are you having pain? Yes NPRS scale: 3/10 Pain location: c-section  Pain type: burning Pain description: intermittent   Aggravating factors: lifting, rolling in bed Relieving factors: rest PAIN:  Are you having pain? Yes: NPRS scale: 3/10 Pain location: low back Pain description: intermittent, achy Aggravating factors: carrying infant with wrap, on feet prolonged time Relieving factors: stretch  PRECAUTIONS: None  WEIGHT BEARING RESTRICTIONS: No  FALLS:  Has patient fallen in last 6 months? No  LIVING ENVIRONMENT: Lives with: lives with their family   OCCUPATION: stay home mom  PLOF: Independent  PATIENT GOALS: reduce leakage, go through the day without leakage  PERTINENT HISTORY:  Cesarean section x2; history of fibroid and ovarian cysts   BOWEL MOVEMENT:no issues   URINATION: Pain with urination: No Fully empty bladder: Yes:   Stream: Strong, feels like a fire hose Urgency: Yes:   Frequency: average Leakage: Urge to void, Walking to the bathroom, Coughing, Sneezing, and Laughing Pads: Yes: sometimes   INTERCOURSE: not active at this time  PREGNANCY: Vaginal deliveries 2 Tearing Yes: second degree tear  C-section deliveries 2 Currently pregnant No  PROLAPSE: None   OBJECTIVE:   DIAGNOSTIC FINDINGS:  none   COGNITION: Overall cognitive status: Within functional limits for tasks assessed      POSTURE:  rounded shoulders, forward head, and increased lumbar lordosis  PELVIC ALIGNMENT: left ilium rotated posteriorly  LUMBARAROM/PROM:  A/PROM A/PROM  eval  Flexion Decreased by 25%  Extension Decreased by 25%  Right lateral flexion Decreased by 25%  Left lateral flexion Decreased by 25%  Right rotation Decreased by 50%  Left rotation Decreased by 50%   (Blank rows = not tested)  LOWER EXTREMITY ROM: bilateral hip ROM is full   LOWER EXTREMITY MMT:  MMT Right eval Left eval  Hip flexion 4/5 4/5  Hip extension 4/5 4/5  Hip abduction 3/5 3/5  Hip adduction 4/5 4/5   PALPATION:   General  tenderness throughout the abdomen, lumbar paraspinals; decreased movement of T8-L5                External Perineal Exam dryness                             Internal Pelvic Floor tenderness located on the levator ani, obturator internist, sides of the urethra and bladder, decreased movement of the anterior cervix  Patient confirms identification and approves PT to assess internal pelvic floor and treatment Yes  PELVIC MMT:   MMT eval  Vaginal 2/5  Diastasis Recti 6 fingers width above and below with no tension   (Blank rows = not tested)        TONE: increased  PROLAPSE: Anterior wall above the introitus  TODAY'S TREATMENT:  03/21/23 Manual: Scar tissue mobilization: Scar massage to release restrictions on the scar and around the scar Neuromuscular re-education: Core facilitation: Transverse abdominus contraction with breath and ball squeeze 15 x  Bridge with ball squeeze 10 x Hookly ball squeeze alternate shoulder flexion with abdominal contraction 20 x  03/19/23 Manual: Taping Kinesiotape to correct diastasis Recti wth overlapping diagonals Soft tissue mobilization: To assess for dry needling Manual work to the gluteals and piriformis and rectus Scar tissue mobilization: Scar mobilization and educated patient on how to perform at home Trigger Point Dry-Needling   Treatment instructions: Expect mild to moderate muscle soreness. S/S of pneumothorax if dry needled  over a lung field, and to seek immediate medical attention should they occur. Patient verbalized understanding of these instructions and education.  Patient Consent Given: Yes Education handout provided: Yes Muscles treated: gluteus medius, gluteus maximus, piriformis, rectus abdominus, c-section scar Electrical stimulation performed: No Parameters: N/A Treatment response/outcome: elongation of muscle and trigger point release Neuromuscular re-education: Core facilitation: Abdominal contraction with tactile dues in supine and standing Exercises: Stretches/mobility: Sitting piriformis holding 30 sec each   03/12/23 Manual: Soft tissue mobilization: To assess for dry needling Manual work to the lumbar paraspinals, along the quadratus, along the lower ribs and gluteals Myofascial release: Quadruped pulling skin from back to abdominals to release the area Fascial release of the lower abdomen moving in different directions Spinal mobilization: Manual mobilization to T10-L5 grade 3 Lower rib cage mobilization Trigger Point Dry-Needling  Treatment instructions: Expect mild to moderate muscle soreness. S/S of pneumothorax if dry needled over a lung field, and to seek immediate medical attention should they occur. Patient verbalized understanding of these instructions and education.  Patient Consent Given: Yes Education handout provided: Yes Muscles treated: lumbar multifidi, quadratus Electrical stimulation performed: No Parameters: N/A Treatment response/outcome: elongation of muscle and trigger point release Taping Kinesiotape to correct diastasis Recti wth overlapping diagonals Exercises: Stretches/mobility: Thread the needling bil. 10 x Marjo Bicker pose hold 30 sec 2 x                                                                                                                                 PATIENT EDUCATION: 03/21/23 Education details: [id number], information on dry needling Person educated: Patient Education method: Explanation, Demonstration, Tactile cues, Verbal cues, and Handouts Education comprehension: verbalized understanding, returned demonstration, verbal cues required, tactile cues required, and needs further education   HOME EXERCISE PROGRAM: 03/21/23 Access Code: [email or web address] URL: [email or web address] Date: 03/21/2023 Prepared by: Eulis Foster  Exercises - - Hooklying Transversus Abdominis Palpation  - 2 x daily - 7 x weekly - 1 sets - 10 reps - Supine Bridge with Mini Swiss Ball Between Knees  - 1 x daily - 7 x weekly - 1 sets - 10 reps - Dead Bug  - 1 x daily - 7 x weekly - 2 sets - 10 reps   ASSESSMENT:  CLINICAL IMPRESSION: Patient is a 37 y.o. female  who was seen today for physical therapy  treatment for stress incontinence.Patient scar had increased mobility after the manual work. She was able to contract her lower abdominals. Patient has difficulty with bridges due to weakness. She is not doming her midline of abdomen with movement when the kinesio tape is on.   Patient would benefit from skilled therapy to improve strength and coordination while reducing pain and urinary leakage.   OBJECTIVE IMPAIRMENTS: decreased activity tolerance, decreased coordination, decreased endurance, decreased ROM, decreased strength, increased fascial restrictions, increased muscle spasms, and pain.   ACTIVITY LIMITATIONS: carrying, lifting, standing, transfers, continence, and caring for others  PARTICIPATION LIMITATIONS: meal prep, cleaning, laundry, driving, shopping, and community activity  PERSONAL FACTORS: Time since onset of injury/illness/exacerbation and 1-2 comorbidities: Cesarean section x2; history of fibroid and ovarian cysts  are also affecting patient's functional outcome.   REHAB POTENTIAL: Excellent  CLINICAL DECISION MAKING:  Evolving/moderate complexity  EVALUATION COMPLEXITY: Moderate   GOALS: Goals reviewed with patient? Yes  SHORT TERM GOALS: Target date: 04/04/23  Patient independent with initial HEP for pelvic floor and core.  Baseline: Goal status: INITIAL  2.  Patient is able to perform a circular contraction with the pelvic floor and slight lift.  Baseline:  Goal status: INITIAL  3.  Minimal tenderness in the pelvic floor muscles due to reduction of trigger points.  Baseline:  Goal status: INITIAL  4.  Patient has increased lumbar ROM by 25% due to improved tissue and joint mobility.  Baseline:  Goal status: INITIAL   LONG TERM GOALS: Target date: 05/30/23  Patient independent with advanced HEP for core, pelvic and hip strength.  Baseline:  Goal status: INITIAL  2.  Patient pelvic floor strength >/= 3/5 so her urinary leakage is </- 75% better.  Baseline:  Goal status: INITIAL  3.  Patient diastasis recti reduce </= 3 finger width with good tension.  Baseline:  Goal status: INITIAL  4.  Low back pain decreased >/= 75% due to increased mobility and reduction of trigger points.  Baseline:  Goal status: INITIAL  5.  Patient is able to carry her infant with lumbar pain decreased </= 75%.  Baseline:  Goal status: INITIAL    PLAN:  PT FREQUENCY: 1-2x/week  PT DURATION: 12 weeks  PLANNED INTERVENTIONS: Therapeutic exercises, Therapeutic activity, Neuromuscular re-education, Patient/Family education, Joint mobilization, Dry Needling, Electrical stimulation, Spinal mobilization, Cryotherapy, Moist heat, Taping, Ultrasound, Biofeedback, and Manual therapy  PLAN FOR NEXT SESSION: manual work to lumbar and rib cage, tape up diastasis, pelvic floor drop, hip stretches, manual work to -c-section scar, , abdominal contraction, gluteal contraction   Eulis Foster, PT 03/21/23 10:17 AM

## 2023-03-26 ENCOUNTER — Encounter: Payer: Self-pay | Admitting: Physical Therapy

## 2023-03-27 ENCOUNTER — Ambulatory Visit (INDEPENDENT_AMBULATORY_CARE_PROVIDER_SITE_OTHER)

## 2023-03-27 DIAGNOSIS — L501 Idiopathic urticaria: Secondary | ICD-10-CM | POA: Diagnosis not present

## 2023-03-31 ENCOUNTER — Ambulatory Visit: Admitting: Physical Therapy

## 2023-04-02 ENCOUNTER — Ambulatory Visit: Attending: Obstetrics and Gynecology | Admitting: Physical Therapy

## 2023-04-02 ENCOUNTER — Encounter: Payer: Self-pay | Admitting: Physical Therapy

## 2023-04-02 DIAGNOSIS — R102 Pelvic and perineal pain: Secondary | ICD-10-CM | POA: Insufficient documentation

## 2023-04-02 DIAGNOSIS — R1084 Generalized abdominal pain: Secondary | ICD-10-CM | POA: Insufficient documentation

## 2023-04-02 DIAGNOSIS — R252 Cramp and spasm: Secondary | ICD-10-CM | POA: Insufficient documentation

## 2023-04-02 DIAGNOSIS — M5459 Other low back pain: Secondary | ICD-10-CM | POA: Diagnosis present

## 2023-04-02 DIAGNOSIS — M6281 Muscle weakness (generalized): Secondary | ICD-10-CM | POA: Insufficient documentation

## 2023-04-02 NOTE — Therapy (Signed)
OUTPATIENT PHYSICAL THERAPY FEMALE PELVIC TREATMENT   Patient Name: Morgan Boyd MRN: [id number] DOB:[birth year], 37 y.o., female Today's Date: 04/02/2023  END OF SESSION:  PT End of Session - 04/02/23 0802     Visit Number 5    Date for PT Re-Evaluation 05/30/23    Authorization Type [employer]    PT Start Time 0800    PT Stop Time 0840    PT Time Calculation (min) 40 min    Activity Tolerance Patient tolerated treatment well    Behavior During Therapy WFL for tasks assessed/performed             Past Medical History:  Diagnosis Date   Allergy    Autoimmune urticaria    Bilateral ovarian cysts    Bronchitis    Fibroid    Gestational hypertension    Vitamin D deficiency    Past Surgical History:  Procedure Laterality Date   CESAREAN SECTION N/A 05/14/2021   Procedure: CESAREAN SECTION;  Surgeon: Hoover Browns, MD;  Location: [facility] OR;  Service: Obstetrics;  Laterality: N/A;   CESAREAN SECTION N/A 01/06/2023   Procedure: CESAREAN SECTION;  Surgeon: Hoover Browns, MD;  Location: [facility] ORS;  Service: Obstetrics;  Laterality: N/A;   DILATION AND CURETTAGE OF UTERUS  10/21/2008   IUD REMOVAL     TONSILLECTOMY     Patient Active Problem List   Diagnosis Date Noted   Elevated blood pressure reading without diagnosis of hypertension 01/08/2023   Status post repeat low transverse cesarean section 01/06/2023   History of prior pregnancy with IUGR newborn 12/23/2022   History of severe pre-eclampsia 12/23/2022   Palpitations 12/23/2022   Maternal obesity affecting pregnancy, antepartum--BMI 46 12/23/2022   Delivery by classical cesarean section 05/14/2021   Fibroids 04/23/2021   Vitamin D deficiency 04/23/2021   AMA (advanced maternal age) multigravida 35+ 04/20/2021    PCP: Chyrel Masson  REFERRING PROVIDER:  Hands, Kylie, NP     REFERRING DIAG: N39.3 (ICD-10-CM) - Stress incontinence (female) (female)   THERAPY DIAG:  Muscle weakness (generalized)  Other  low back pain  Generalized abdominal pain  Pelvic pain  Cramp and spasm  Rationale for Evaluation and Treatment: Rehabilitation  ONSET DATE: 1/24  SUBJECTIVE:                                                                                                                                                                                           SUBJECTIVE STATEMENT: I feel my mid section is more together and smaller. Back pain is less.     PAIN:  Are you having pain? Yes NPRS  scale: 2/10 Pain location: c-section  Pain type: burning Pain description: intermittent   Aggravating factors: lifting, rolling in bed Relieving factors: rest PAIN:  Are you having pain? Yes: NPRS scale: 2/10 Pain location: low back Pain description: intermittent, achy Aggravating factors: carrying infant with wrap, on feet prolonged time Relieving factors: stretch  PRECAUTIONS: None  WEIGHT BEARING RESTRICTIONS: No  FALLS:  Has patient fallen in last 6 months? No  LIVING ENVIRONMENT: Lives with: lives with their family   OCCUPATION: stay home mom  PLOF: Independent  PATIENT GOALS: reduce leakage, go through the day without leakage  PERTINENT HISTORY:  Cesarean section x2; history of fibroid and ovarian cysts   BOWEL MOVEMENT:no issues   URINATION: Pain with urination: No Fully empty bladder: Yes:   Stream: Strong, feels like a fire hose Urgency: Yes:   Frequency: average Leakage: Urge to void, Walking to the bathroom, Coughing, Sneezing, and Laughing Pads: Yes: sometimes   INTERCOURSE: not active at this time  PREGNANCY: Vaginal deliveries 2 Tearing Yes: second degree tear  C-section deliveries 2 Currently pregnant No  PROLAPSE: None   OBJECTIVE:   DIAGNOSTIC FINDINGS:  none   COGNITION: Overall cognitive status: Within functional limits for tasks assessed      POSTURE: rounded shoulders, forward head, and increased lumbar lordosis  PELVIC ALIGNMENT: left  ilium rotated posteriorly  LUMBARAROM/PROM:  A/PROM A/PROM  eval 04/02/23  Flexion Decreased by 25% full  Extension Decreased by 25% full  Right lateral flexion Decreased by 25% full  Left lateral flexion Decreased by 25% full  Right rotation Decreased by 50% Decreased by 25%  Left rotation Decreased by 50% Decreased by 25%   (Blank rows = not tested)  LOWER EXTREMITY ROM: bilateral hip ROM is full   LOWER EXTREMITY MMT:  MMT Right eval Left eval  Hip flexion 4/5 4/5  Hip extension 4/5 4/5  Hip abduction 3/5 3/5  Hip adduction 4/5 4/5   PALPATION:   General  tenderness throughout the abdomen, lumbar paraspinals; decreased movement of T8-L5                External Perineal Exam dryness                             Internal Pelvic Floor tenderness located on the levator ani, obturator internist, sides of the urethra and bladder, decreased movement of the anterior cervix  Patient confirms identification and approves PT to assess internal pelvic floor and treatment Yes  PELVIC MMT:   MMT eval 04/02/23  Vaginal 2/5 2/5 with a weak lift  Diastasis Recti 6 fingers width above and below with no tension    (Blank rows = not tested)        TONE: increased  PROLAPSE: Anterior wall above the introitus  TODAY'S TREATMENT:  04/02/23 Manual:   Taping Kinesiotape to correct diastasis Recti wth overlapping diagonals Myofascial release: Fascial release of the urogenital diaphragm Release of the perineal body and superior transverse Internal pelvic floor techniques: No emotional/communication barriers or cognitive limitation. Patient is motivated to learn. Patient understands and agrees with treatment goals and plan. PT explains patient will be examined in standing, sitting, and lying down to see how their muscles and joints work. When they are ready, they will be asked to remove their underwear so PT can examine their perineum. The patient is also given the option of providing  their own chaperone as one is not provided  in our facility. The patient also has the right and is explained the right to defer or refuse any part of the evaluation or treatment including the internal exam. With the patient's consent, PT will use one gloved finger to gently assess the muscles of the pelvic floor, seeing how well it contracts and relaxes and if there is muscle symmetry. After, the patient will get dressed and PT and patient will discuss exam findings and plan of care. PT and patient discuss plan of care, schedule, attendance policy and HEP activities.  Going through the vaginal canal working on the right levator ani with right hip movement to release the muscles.  Neuromuscular re-education: Core retraining: Transverse abdominus contraction with ball squeeze and tactile cues to lower abdomen Supine ball squeeze mini march with abdominal contraction Supine with ball squeeze moving knees side to side with abdominal contraction.    03/21/23 Manual: Scar tissue mobilization: Scar massage to release restrictions on the scar and around the scar Neuromuscular re-education: Core facilitation: Transverse abdominus contraction with breath and ball squeeze 15 x  Bridge with ball squeeze 10 x Hookly ball squeeze alternate shoulder flexion with abdominal contraction 20 x  03/19/23 Manual: Taping Kinesiotape to correct diastasis Recti wth overlapping diagonals Soft tissue mobilization: To assess for dry needling Manual work to the gluteals and piriformis and rectus Scar tissue mobilization: Scar mobilization and educated patient on how to perform at home Trigger Point Dry-Needling  Treatment instructions: Expect mild to moderate muscle soreness. S/S of pneumothorax if dry needled over a lung field, and to seek immediate medical attention should they occur. Patient verbalized understanding of these instructions and education.  Patient Consent Given: Yes Education handout provided:  Yes Muscles treated: gluteus medius, gluteus maximus, piriformis, rectus abdominus, c-section scar Electrical stimulation performed: No Parameters: N/A Treatment response/outcome: elongation of muscle and trigger point release Neuromuscular re-education: Core facilitation: Abdominal contraction with tactile dues in supine and standing Exercises: Stretches/mobility: Sitting piriformis holding 30 sec each                                                                                                                                  PATIENT EDUCATION: 03/21/23 Education details: [id number], information on dry needling Person educated: Patient Education method: Explanation, Demonstration, Tactile cues, Verbal cues, and Handouts Education comprehension: verbalized understanding, returned demonstration, verbal cues required, tactile cues required, and needs further education   HOME EXERCISE PROGRAM: 03/21/23 Access Code: [email or web address] URL: [email or web address] Date: 03/21/2023 Prepared by: Eulis Foster  Exercises - - Hooklying Transversus Abdominis Palpation  - 2 x daily - 7 x weekly - 1 sets - 10 reps - Supine Bridge with Mini Swiss Ball Between Knees  - 1 x daily - 7 x weekly - 1 sets - 10 reps - Dead Bug  - 1 x daily - 7 x weekly - 2 sets - 10 reps   ASSESSMENT:  CLINICAL IMPRESSION: Patient is a 37  y.o. female  who was seen today for physical therapy  treatment for stress incontinence. Patient lumbar ROM is improved by 25%. She is having less back pain. She feels her diastasis is better.  Pelvic floor strength is 2/5 with a weak lift. Increased tension in the diastasis recti. Patient is using her obliques now. She still has difficulty doing a bridge. She has tightness in the levator ani and obturator internist. Patient would benefit from skilled therapy to improve strength and coordination while reducing pain and urinary leakage.   OBJECTIVE IMPAIRMENTS: decreased activity  tolerance, decreased coordination, decreased endurance, decreased ROM, decreased strength, increased fascial restrictions, increased muscle spasms, and pain.   ACTIVITY LIMITATIONS: carrying, lifting, standing, transfers, continence, and caring for others  PARTICIPATION LIMITATIONS: meal prep, cleaning, laundry, driving, shopping, and community activity  PERSONAL FACTORS: Time since onset of injury/illness/exacerbation and 1-2 comorbidities: Cesarean section x2; history of fibroid and ovarian cysts  are also affecting patient's functional outcome.   REHAB POTENTIAL: Excellent  CLINICAL DECISION MAKING: Evolving/moderate complexity  EVALUATION COMPLEXITY: Moderate   GOALS: Goals reviewed with patient? Yes  SHORT TERM GOALS: Target date: 04/04/23  Patient independent with initial HEP for pelvic floor and core.  Baseline: Goal status: INITIAL  2.  Patient is able to perform a circular contraction with the pelvic floor and slight lift.  Baseline:  Goal status: Met 04/02/23  3.  Minimal tenderness in the pelvic floor muscles due to reduction of trigger points.  Baseline:  Goal status: INITIAL  4.  Patient has increased lumbar ROM by 25% due to improved tissue and joint mobility.  Baseline:  Goal status: Met 04/02/23   LONG TERM GOALS: Target date: 05/30/23  Patient independent with advanced HEP for core, pelvic and hip strength.  Baseline:  Goal status: INITIAL  2.  Patient pelvic floor strength >/= 3/5 so her urinary leakage is </- 75% better.  Baseline:  Goal status: INITIAL  3.  Patient diastasis recti reduce </= 3 finger width with good tension.  Baseline:  Goal status: INITIAL  4.  Low back pain decreased >/= 75% due to increased mobility and reduction of trigger points.  Baseline:  Goal status: INITIAL  5.  Patient is able to carry her infant with lumbar pain decreased </= 75%.  Baseline:  Goal status: INITIAL    PLAN:  PT FREQUENCY: 1-2x/week  PT  DURATION: 12 weeks  PLANNED INTERVENTIONS: Therapeutic exercises, Therapeutic activity, Neuromuscular re-education, Patient/Family education, Joint mobilization, Dry Needling, Electrical stimulation, Spinal mobilization, Cryotherapy, Moist heat, Taping, Ultrasound, Biofeedback, and Manual therapy  PLAN FOR NEXT SESSION: manual work to pelvic floor, tape up diastasis, pelvic floor drop, hip stretches for gluteal, manual work to -c-section scar, , abdominal contraction, gluteal contraction, review exercises   Eulis Foster, PT 04/02/23 8:46 AM

## 2023-04-09 ENCOUNTER — Encounter: Payer: Self-pay | Admitting: Physical Therapy

## 2023-04-09 ENCOUNTER — Ambulatory Visit: Admitting: Physical Therapy

## 2023-04-09 DIAGNOSIS — M6281 Muscle weakness (generalized): Secondary | ICD-10-CM

## 2023-04-09 DIAGNOSIS — R252 Cramp and spasm: Secondary | ICD-10-CM

## 2023-04-09 DIAGNOSIS — M5459 Other low back pain: Secondary | ICD-10-CM

## 2023-04-09 DIAGNOSIS — R102 Pelvic and perineal pain: Secondary | ICD-10-CM

## 2023-04-09 DIAGNOSIS — R1084 Generalized abdominal pain: Secondary | ICD-10-CM

## 2023-04-09 NOTE — Therapy (Signed)
OUTPATIENT PHYSICAL THERAPY FEMALE PELVIC TREATMENT   Patient Name: Morgan Boyd MRN: [id number] DOB:1986/09/24, 37 y.o., female Today's Date: 04/09/2023  END OF SESSION:  PT End of Session - 04/09/23 1451     Visit Number 6    Date for PT Re-Evaluation 05/30/23    Authorization Type [employer]    PT Start Time 1450    PT Stop Time 1528    PT Time Calculation (min) 38 min    Activity Tolerance Patient tolerated treatment well    Behavior During Therapy WFL for tasks assessed/performed             Past Medical History:  Diagnosis Date   Allergy    Autoimmune urticaria    Bilateral ovarian cysts    Bronchitis    Fibroid    Gestational hypertension    Vitamin D deficiency    Past Surgical History:  Procedure Laterality Date   CESAREAN SECTION N/A 05/14/2021   Procedure: CESAREAN SECTION;  Surgeon: Hoover Browns, MD;  Location: [facility] OR;  Service: Obstetrics;  Laterality: N/A;   CESAREAN SECTION N/A 01/06/2023   Procedure: CESAREAN SECTION;  Surgeon: Hoover Browns, MD;  Location: [facility] LD ORS;  Service: Obstetrics;  Laterality: N/A;   DILATION AND CURETTAGE OF UTERUS  10/21/2008   IUD REMOVAL     TONSILLECTOMY     Patient Active Problem List   Diagnosis Date Noted   Elevated blood pressure reading without diagnosis of hypertension 01/08/2023   Status post repeat low transverse cesarean section 01/06/2023   History of prior pregnancy with IUGR newborn 12/23/2022   History of severe pre-eclampsia 12/23/2022   Palpitations 12/23/2022   Maternal obesity affecting pregnancy, antepartum--BMI 46 12/23/2022   Delivery by classical cesarean section 05/14/2021   Fibroids 04/23/2021   Vitamin D deficiency 04/23/2021   AMA (advanced maternal age) multigravida 35+ 04/20/2021    PCP: Chyrel Masson  REFERRING PROVIDER:  Hands, Kylie, NP     REFERRING DIAG: N39.3 (ICD-10-CM) - Stress incontinence (female) (female)   THERAPY DIAG:  Muscle weakness (generalized)  Other  low back pain  Generalized abdominal pain  Pelvic pain  Cramp and spasm  Rationale for Evaluation and Treatment: Rehabilitation  ONSET DATE: 1/24  SUBJECTIVE:                                                                                                                                                                                           SUBJECTIVE STATEMENT: The belly binder helps the back pain. Has intercourse and was a little painful.      PAIN:  Are you having pain? Yes  NPRS scale: 2/10 Pain location: c-section  Pain type: burning Pain description: intermittent   Aggravating factors: lifting, rolling in bed Relieving factors: rest PAIN:  Are you having pain? Yes: NPRS scale: 2/10 Pain location: low back Pain description: intermittent, achy Aggravating factors: carrying infant with wrap, on feet prolonged time Relieving factors: stretch  PRECAUTIONS: None  WEIGHT BEARING RESTRICTIONS: No  FALLS:  Has patient fallen in last 6 months? No  LIVING ENVIRONMENT: Lives with: lives with their family   OCCUPATION: stay home mom  PLOF: Independent  PATIENT GOALS: reduce leakage, go through the day without leakage  PERTINENT HISTORY:  Cesarean section x2; history of fibroid and ovarian cysts   BOWEL MOVEMENT:no issues   URINATION: Pain with urination: No Fully empty bladder: Yes:   Stream: Strong, feels like a fire hose Urgency: Yes:   Frequency: average Leakage: Urge to void, Walking to the bathroom, Coughing, Sneezing, and Laughing Pads: Yes: sometimes   INTERCOURSE: not active at this time  PREGNANCY: Vaginal deliveries 2 Tearing Yes: second degree tear  C-section deliveries 2 Currently pregnant No  PROLAPSE: None   OBJECTIVE:   DIAGNOSTIC FINDINGS:  none   COGNITION: Overall cognitive status: Within functional limits for tasks assessed      POSTURE: rounded shoulders, forward head, and increased lumbar lordosis  PELVIC  ALIGNMENT: left ilium rotated posteriorly  LUMBARAROM/PROM:  A/PROM A/PROM  eval 04/02/23  Flexion Decreased by 25% full  Extension Decreased by 25% full  Right lateral flexion Decreased by 25% full  Left lateral flexion Decreased by 25% full  Right rotation Decreased by 50% Decreased by 25%  Left rotation Decreased by 50% Decreased by 25%   (Blank rows = not tested)  LOWER EXTREMITY ROM: bilateral hip ROM is full   LOWER EXTREMITY MMT:  MMT Right eval Left eval  Hip flexion 4/5 4/5  Hip extension 4/5 4/5  Hip abduction 3/5 3/5  Hip adduction 4/5 4/5   PALPATION:   General  tenderness throughout the abdomen, lumbar paraspinals; decreased movement of T8-L5                External Perineal Exam dryness                             Internal Pelvic Floor tenderness located on the levator ani, obturator internist, sides of the urethra and bladder, decreased movement of the anterior cervix  Patient confirms identification and approves PT to assess internal pelvic floor and treatment Yes  PELVIC MMT:   MMT eval 04/02/23 04/09/23  Vaginal 2/5 2/5 with a weak lift 3/5  Diastasis Recti 6 fingers width above and below with no tension     (Blank rows = not tested)       TONE: increased  PROLAPSE: Anterior wall above the introitus  TODAY'S TREATMENT:  04/09/23 Manual: Internal pelvic floor techniques: No emotional/communication barriers or cognitive limitation. Patient is motivated to learn. Patient understands and agrees with treatment goals and plan. PT explains patient will be examined in standing, sitting, and lying down to see how their muscles and joints work. When they are ready, they will be asked to remove their underwear so PT can examine their perineum. The patient is also given the option of providing their own chaperone as one is not provided in our facility. The patient also has the right and is explained the right to defer or refuse any part of the evaluation or  treatment including the internal exam. With the patient's consent, PT will use one gloved finger to gently assess the muscles of the pelvic floor, seeing how well it contracts and relaxes and if there is muscle symmetry. After, the patient will get dressed and PT and patient will discuss exam findings and plan of care. PT and patient discuss plan of care, schedule, attendance policy and HEP activities.  Working on the left pelvic floor including pubovaginalis, levator ani, obturator internist, left side of urethra and perineal body Neuromuscular re-education: Pelvic floor contraction training: Supine therapist finger in the vaginal canal giving tactile cues for pelvic floor contraction  Sitting pelvic floor contraction with tactile cues to abdomen to work on lift Educated patient on vaginal lubricants to use for the health of the vaginal tissue and reduce friction with penile penetration  04/02/23 Manual: Taping Kinesiotape to correct diastasis Recti wth overlapping diagonals Myofascial release: Fascial release of the urogenital diaphragm Release of the perineal body and superior transverse Internal pelvic floor techniques: No emotional/communication barriers or cognitive limitation. Patient is motivated to learn. Patient understands and agrees with treatment goals and plan. PT explains patient will be examined in standing, sitting, and lying down to see how their muscles and joints work. When they are ready, they will be asked to remove their underwear so PT can examine their perineum. The patient is also given the option of providing their own chaperone as one is not provided in our facility. The patient also has the right and is explained the right to defer or refuse any part of the evaluation or treatment including the internal exam. With the patient's consent, PT will use one gloved finger to gently assess the muscles of the pelvic floor, seeing how well it contracts and relaxes and if there is  muscle symmetry. After, the patient will get dressed and PT and patient will discuss exam findings and plan of care. PT and patient discuss plan of care, schedule, attendance policy and HEP activities.  Going through the vaginal canal working on the right levator ani with right hip movement to release the muscles.  Neuromuscular re-education: Core retraining: Transverse abdominus contraction with ball squeeze and tactile cues to lower abdomen Supine ball squeeze mini march with abdominal contraction Supine with ball squeeze moving knees side to side with abdominal contraction.    03/21/23 Manual: Scar tissue mobilization: Scar massage to release restrictions on the scar and around the scar Neuromuscular re-education: Core facilitation: Transverse abdominus contraction with breath and ball squeeze 15 x  Bridge with ball squeeze 10 x Hookly ball squeeze alternate shoulder flexion with abdominal contraction 20 x                                                                          PATIENT EDUCATION: 03/21/23 Education details: [id number], information on dry needling Person educated: Patient Education method: Explanation, Demonstration, Tactile cues, Verbal cues, and Handouts Education comprehension: verbalized understanding, returned demonstration, verbal cues required, tactile cues required, and needs further education   HOME EXERCISE PROGRAM: 03/21/23 Access Code: [email or web address] URL: [email or web address] Date: 03/21/2023 Prepared by: Eulis Foster  Exercises - - Hooklying Transversus Abdominis Palpation  - 2 x daily - 7 x weekly - 1 sets -  10 reps - Supine Bridge with Mini Swiss Ball Between Knees  - 1 x daily - 7 x weekly - 1 sets - 10 reps - Dead Bug  - 1 x daily - 7 x weekly - 2 sets - 10 reps   ASSESSMENT:  CLINICAL IMPRESSION: Patient is a 37 y.o. female  who was seen today for physical therapy  treatment for stress incontinence.  Patient is having less pain when  wearing her belly binder. She had vaginal penetration and had pain. Pelvic floor strength increased to 3/5. She is able to engage her lower abdominals better. She has tenderness in the left pelvic floor and after manual work it decreased. Patient would benefit from skilled therapy to improve strength and coordination while reducing pain and urinary leakage.   OBJECTIVE IMPAIRMENTS: decreased activity tolerance, decreased coordination, decreased endurance, decreased ROM, decreased strength, increased fascial restrictions, increased muscle spasms, and pain.   ACTIVITY LIMITATIONS: carrying, lifting, standing, transfers, continence, and caring for others  PARTICIPATION LIMITATIONS: meal prep, cleaning, laundry, driving, shopping, and community activity  PERSONAL FACTORS: Time since onset of injury/illness/exacerbation and 1-2 comorbidities: Cesarean section x2; history of fibroid and ovarian cysts  are also affecting patient's functional outcome.   REHAB POTENTIAL: Excellent  CLINICAL DECISION MAKING: Evolving/moderate complexity  EVALUATION COMPLEXITY: Moderate   GOALS: Goals reviewed with patient? Yes  SHORT TERM GOALS: Target date: 04/04/23  Patient independent with initial HEP for pelvic floor and core.  Baseline: Goal status: Met 04/09/23  2.  Patient is able to perform a circular contraction with the pelvic floor and slight lift.  Baseline:  Goal status: Met 04/02/23  3.  Minimal tenderness in the pelvic floor muscles due to reduction of trigger points.  Baseline:  Goal status: INITIAL  4.  Patient has increased lumbar ROM by 25% due to improved tissue and joint mobility.  Baseline:  Goal status: Met 04/02/23   LONG TERM GOALS: Target date: 05/30/23  Patient independent with advanced HEP for core, pelvic and hip strength.  Baseline:  Goal status: INITIAL  2.  Patient pelvic floor strength >/= 3/5 so her urinary leakage is </- 75% better.  Baseline:  Goal status:  INITIAL  3.  Patient diastasis recti reduce </= 3 finger width with good tension.  Baseline:  Goal status: INITIAL  4.  Low back pain decreased >/= 75% due to increased mobility and reduction of trigger points.  Baseline:  Goal status: INITIAL  5.  Patient is able to carry her infant with lumbar pain decreased </= 75%.  Baseline:  Goal status: INITIAL    PLAN:  PT FREQUENCY: 1-2x/week  PT DURATION: 12 weeks  PLANNED INTERVENTIONS: Therapeutic exercises, Therapeutic activity, Neuromuscular re-education, Patient/Family education, Joint mobilization, Dry Needling, Electrical stimulation, Spinal mobilization, Cryotherapy, Moist heat, Taping, Ultrasound, Biofeedback, and Manual therapy  PLAN FOR NEXT SESSION: manual work to pelvic floor, tape up diastasis, pelvic floor drop, hip stretches for gluteal, manual work to -c-section scar, abdominal contraction, gluteal contraction, review exercises   Eulis Foster, PT 04/09/23 3:28 PM

## 2023-04-09 NOTE — Patient Instructions (Signed)
  Lubrication Used for intercourse to reduce friction Avoid ones that have glycerin, nonoxynol-9, petroleum, propylene glycol, chlorhexidine gluconate, warming gels, tingling gels, icing or cooling gel, scented Avoid parabens due to a preservative similar to female sex hormone May need to be reapplied once or several times during sexual activity Can be applied to both partners genitals prior to vaginal penetration to minimize friction or irritation Prevent irritation and mucosal tears that cause post coital pain and increased the risk of vaginal and urinary tract infections Oil-based lubricants cannot be used with condoms due to breaking them down.  Least likely to irritate vaginal tissue.  Plant based-lubes are safe Silicone-based lubrication are thicker and last long and used for post-menopausal women  Vaginal Lubricators Here is a list of some suggested lubricators you can use for intercourse. Use the most hypoallergenic product.  You can place on you or your partner.  Slippery Stuff ( water based) Sylk or Sliquid Natural H2O ( good  if frequent UTI's)- [employer], amazon Sliquid organics silk-(aloe and silicone based ) [employer] (www.blossom-organics.com)- (aloe based ) Coconut oil, olive oil -not good with condoms  PJur Woman Nude- (water based) amazon Uberlube- ( silicon) Amazon Aloe Vera- Sprouts has an organic one Yes lubricant- (water based and has plant oil based similar to silicone) [employer] Platinum-Silicone, [employer], [facility] Olive and Bee intimate cream-  www.oliveandbee.com.au Pink - [employer] Erosense Sync- [employer], amazon Coconu- coconu.com [employer] to avoid in lubricants are glycerin, warming gels, tingling gels, icing or cooling  gels, and scented gels.  Also avoid Vaseline. KY jelly,  and Astroglide contain chlorhexidine which kills good bacteria(lactobacilli)  Things to avoid in the vaginal area Do not use things to irritate the  vulvar area No lotions- see below Soaps you  can use :Aveeno, [place], Good Clean Love cleanser if needed. Must be gentle No deodorants No douches Good to sleep without underwear to let the vaginal area to air out No scrubbing: spread the lips to let warm water rinse over labias and pat dry  Creams that can be used on the Vulva Area V [employer], [employer] Vital V Wild Yam Salve Julva- [employer] Botanical Pro-Meno Wild Yam Cream Coconut oil, olive oil Cleo by [employer] labial moisturizer -[employer],  Desert Beatty Releveum ( lidocaine) or Desert [employer] Gele Yes [facility] [address] [place], [place] [address] Phone # [phone] Fax [phone]

## 2023-04-14 ENCOUNTER — Encounter: Payer: Self-pay | Admitting: Physical Therapy

## 2023-04-14 ENCOUNTER — Ambulatory Visit: Admitting: Physical Therapy

## 2023-04-14 DIAGNOSIS — M5459 Other low back pain: Secondary | ICD-10-CM

## 2023-04-14 DIAGNOSIS — M6281 Muscle weakness (generalized): Secondary | ICD-10-CM | POA: Diagnosis not present

## 2023-04-14 DIAGNOSIS — R1084 Generalized abdominal pain: Secondary | ICD-10-CM

## 2023-04-14 DIAGNOSIS — R252 Cramp and spasm: Secondary | ICD-10-CM

## 2023-04-14 DIAGNOSIS — R102 Pelvic and perineal pain: Secondary | ICD-10-CM

## 2023-04-14 NOTE — Therapy (Signed)
OUTPATIENT PHYSICAL THERAPY FEMALE PELVIC TREATMENT   Patient Name: Morgan Boyd MRN: [id number] DOB:[birth year], 37 y.o., female Today's Date: 04/14/2023  END OF SESSION:  PT End of Session - 04/14/23 0859     Visit Number 7    Date for PT Re-Evaluation 05/30/23    Authorization Type [employer]    PT Start Time 0859   came late   PT Stop Time 0925    PT Time Calculation (min) 26 min    Activity Tolerance Patient tolerated treatment well    Behavior During Therapy WFL for tasks assessed/performed             Past Medical History:  Diagnosis Date   Allergy    Autoimmune urticaria    Bilateral ovarian cysts    Bronchitis    Fibroid    Gestational hypertension    Vitamin D deficiency    Past Surgical History:  Procedure Laterality Date   CESAREAN SECTION N/A 05/14/2021   Procedure: CESAREAN SECTION;  Surgeon: Hoover Browns, MD;  Location: [facility] OR;  Service: Obstetrics;  Laterality: N/A;   CESAREAN SECTION N/A 01/06/2023   Procedure: CESAREAN SECTION;  Surgeon: Hoover Browns, MD;  Location: [facility] LD ORS;  Service: Obstetrics;  Laterality: N/A;   DILATION AND CURETTAGE OF UTERUS  10/21/2008   IUD REMOVAL     TONSILLECTOMY     Patient Active Problem List   Diagnosis Date Noted   Elevated blood pressure reading without diagnosis of hypertension 01/08/2023   Status post repeat low transverse cesarean section 01/06/2023   History of prior pregnancy with IUGR newborn 12/23/2022   History of severe pre-eclampsia 12/23/2022   Palpitations 12/23/2022   Maternal obesity affecting pregnancy, antepartum--BMI 46 12/23/2022   Delivery by classical cesarean section 05/14/2021   Fibroids 04/23/2021   Vitamin D deficiency 04/23/2021   AMA (advanced maternal age) multigravida 35+ 04/20/2021    PCP: Chyrel Masson  REFERRING PROVIDER:  Hands, Kylie, NP     REFERRING DIAG: N39.3 (ICD-10-CM) - Stress incontinence (female) (female)   THERAPY DIAG:  Muscle weakness  (generalized)  Other low back pain  Generalized abdominal pain  Pelvic pain  Cramp and spasm  Rationale for Evaluation and Treatment: Rehabilitation  ONSET DATE: 1/24  SUBJECTIVE:                                                                                                                                                                                           SUBJECTIVE STATEMENT: The belly binder helps the back pain. Has intercourse and was a little painful.      PAIN:  Are you  having pain? Yes NPRS scale: 2/10 Pain location: c-section  Pain type: burning Pain description: intermittent   Aggravating factors: lifting, rolling in bed Relieving factors: rest PAIN:  Are you having pain? Yes: NPRS scale: 2/10 Pain location: low back Pain description: intermittent, achy Aggravating factors: carrying infant with wrap, on feet prolonged time Relieving factors: stretch  PRECAUTIONS: None  WEIGHT BEARING RESTRICTIONS: No  FALLS:  Has patient fallen in last 6 months? No  LIVING ENVIRONMENT: Lives with: lives with their family   OCCUPATION: stay home mom  PLOF: Independent  PATIENT GOALS: reduce leakage, go through the day without leakage  PERTINENT HISTORY:  Cesarean section x2; history of fibroid and ovarian cysts   BOWEL MOVEMENT:no issues   URINATION: Pain with urination: No Fully empty bladder: Yes:   Stream: Strong, feels like a fire hose Urgency: Yes:   Frequency: average Leakage: Urge to void, Walking to the bathroom, Coughing, Sneezing, and Laughing Pads: Yes: sometimes   INTERCOURSE: not active at this time  PREGNANCY: Vaginal deliveries 2 Tearing Yes: second degree tear  C-section deliveries 2 Currently pregnant No  PROLAPSE: None   OBJECTIVE:   DIAGNOSTIC FINDINGS:  none   COGNITION: Overall cognitive status: Within functional limits for tasks assessed      POSTURE: rounded shoulders, forward head, and increased lumbar  lordosis  PELVIC ALIGNMENT: left ilium rotated posteriorly  LUMBARAROM/PROM:  A/PROM A/PROM  eval 04/02/23  Flexion Decreased by 25% full  Extension Decreased by 25% full  Right lateral flexion Decreased by 25% full  Left lateral flexion Decreased by 25% full  Right rotation Decreased by 50% Decreased by 25%  Left rotation Decreased by 50% Decreased by 25%   (Blank rows = not tested)  LOWER EXTREMITY ROM: bilateral hip ROM is full   LOWER EXTREMITY MMT:  MMT Right eval Left eval  Hip flexion 4/5 4/5  Hip extension 4/5 4/5  Hip abduction 3/5 3/5  Hip adduction 4/5 4/5   PALPATION:   General  tenderness throughout the abdomen, lumbar paraspinals; decreased movement of T8-L5                External Perineal Exam dryness                             Internal Pelvic Floor tenderness located on the levator ani, obturator internist, sides of the urethra and bladder, decreased movement of the anterior cervix  Patient confirms identification and approves PT to assess internal pelvic floor and treatment Yes  PELVIC MMT:   MMT eval 04/02/23 04/09/23 04/14/23  Vaginal 2/5 2/5 with a weak lift 3/5   Diastasis Recti 6 fingers width above and below with no tension    3 fingers above the umbilicus with good resistance  (Blank rows = not tested)       TONE: increased  PROLAPSE: Anterior wall above the introitus  TODAY'S TREATMENT:  04/14/23 Exercises: Strengthening:(pelvic floor and core engagement with all) Knee fall out with yellow band 10 x each leg  Mini march in supine with yellow band around knees 20 x Dead bug with yellow band around knees 20 x  Supine lower trunk rotation with ball between knees 20 x with tactile cues to the obliques Quadruped engage core and move trunk side to side and forward and back 04/09/23 Manual: Internal pelvic floor techniques: No emotional/communication barriers or cognitive limitation. Patient is motivated to learn. Patient understands and  agrees with  treatment goals and plan. PT explains patient will be examined in standing, sitting, and lying down to see how their muscles and joints work. When they are ready, they will be asked to remove their underwear so PT can examine their perineum. The patient is also given the option of providing their own chaperone as one is not provided in our facility. The patient also has the right and is explained the right to defer or refuse any part of the evaluation or treatment including the internal exam. With the patient's consent, PT will use one gloved finger to gently assess the muscles of the pelvic floor, seeing how well it contracts and relaxes and if there is muscle symmetry. After, the patient will get dressed and PT and patient will discuss exam findings and plan of care. PT and patient discuss plan of care, schedule, attendance policy and HEP activities.  Working on the left pelvic floor including pubovaginalis, levator ani, obturator internist, left side of urethra and perineal body Neuromuscular re-education: Pelvic floor contraction training: Supine therapist finger in the vaginal canal giving tactile cues for pelvic floor contraction  Sitting pelvic floor contraction with tactile cues to abdomen to work on lift Educated patient on vaginal lubricants to use for the health of the vaginal tissue and reduce friction with penile penetration  04/02/23 Manual: Taping Kinesiotape to correct diastasis Recti wth overlapping diagonals Myofascial release: Fascial release of the urogenital diaphragm Release of the perineal body and superior transverse Internal pelvic floor techniques: No emotional/communication barriers or cognitive limitation. Patient is motivated to learn. Patient understands and agrees with treatment goals and plan. PT explains patient will be examined in standing, sitting, and lying down to see how their muscles and joints work. When they are ready, they will be asked to remove  their underwear so PT can examine their perineum. The patient is also given the option of providing their own chaperone as one is not provided in our facility. The patient also has the right and is explained the right to defer or refuse any part of the evaluation or treatment including the internal exam. With the patient's consent, PT will use one gloved finger to gently assess the muscles of the pelvic floor, seeing how well it contracts and relaxes and if there is muscle symmetry. After, the patient will get dressed and PT and patient will discuss exam findings and plan of care. PT and patient discuss plan of care, schedule, attendance policy and HEP activities.  Going through the vaginal canal working on the right levator ani with right hip movement to release the muscles.  Neuromuscular re-education: Core retraining: Transverse abdominus contraction with ball squeeze and tactile cues to lower abdomen Supine ball squeeze mini march with abdominal contraction Supine with ball squeeze moving knees side to side with abdominal contraction.                                                                       PATIENT EDUCATION: 04/14/23 Education details: [id number], information on dry needling Person educated: Patient Education method: Explanation, Demonstration, Tactile cues, Verbal cues, and Handouts Education comprehension: verbalized understanding, returned demonstration, verbal cues required, tactile cues required, and needs further education   HOME EXERCISE PROGRAM: 04/14/23 Access Code: [email or web address]  URL: [email or web address] Date: 04/14/2023 Prepared by: Eulis Foster  Exercises - - Hooklying Small March  - 1 x daily - 7 x weekly - 2 sets - 10 reps - Hooklying Isometric Clamshell  - 1 x daily - 7 x weekly - 2 sets - 10 reps - Dead Bug  - 1 x daily - 3 x weekly - 1 sets - 10 reps - Supine Lower Trunk Rotation  - 1 x daily - 3 x weekly - 1 sets - 10 reps - Quadruped Forward  Weight Shift  - 1 x daily - 3 x weekly - 1 sets - 10 reps - Quadruped Side to Side Weight Shifts  - 1 x daily - 3 x weekly - 1 sets - 10 reps   ASSESSMENT:  CLINICAL IMPRESSION: Patient is a 37 y.o. female  who was seen today for physical therapy  treatment for stress incontinence.  Diastasis rect above the umbilicus is now 3 fingers width with good tension. Patient found the new exercises to be a challenge. She needed tactile cues to engage the obliques. Patient would benefit from skilled therapy to improve strength and coordination while reducing pain and urinary leakage.   OBJECTIVE IMPAIRMENTS: decreased activity tolerance, decreased coordination, decreased endurance, decreased ROM, decreased strength, increased fascial restrictions, increased muscle spasms, and pain.   ACTIVITY LIMITATIONS: carrying, lifting, standing, transfers, continence, and caring for others  PARTICIPATION LIMITATIONS: meal prep, cleaning, laundry, driving, shopping, and community activity  PERSONAL FACTORS: Time since onset of injury/illness/exacerbation and 1-2 comorbidities: Cesarean section x2; history of fibroid and ovarian cysts  are also affecting patient's functional outcome.   REHAB POTENTIAL: Excellent  CLINICAL DECISION MAKING: Evolving/moderate complexity  EVALUATION COMPLEXITY: Moderate   GOALS: Goals reviewed with patient? Yes  SHORT TERM GOALS: Target date: 04/04/23  Patient independent with initial HEP for pelvic floor and core.  Baseline: Goal status: Met 04/09/23  2.  Patient is able to perform a circular contraction with the pelvic floor and slight lift.  Baseline:  Goal status: Met 04/02/23  3.  Minimal tenderness in the pelvic floor muscles due to reduction of trigger points.  Baseline:  Goal status: INITIAL  4.  Patient has increased lumbar ROM by 25% due to improved tissue and joint mobility.  Baseline:  Goal status: Met 04/02/23   LONG TERM GOALS: Target date:  05/30/23  Patient independent with advanced HEP for core, pelvic and hip strength.  Baseline:  Goal status: INITIAL  2.  Patient pelvic floor strength >/= 3/5 so her urinary leakage is </- 75% better.  Baseline:  Goal status: INITIAL  3.  Patient diastasis recti reduce </= 3 finger width with good tension.  Baseline:  Goal status: INITIAL  4.  Low back pain decreased >/= 75% due to increased mobility and reduction of trigger points.  Baseline:  Goal status: INITIAL  5.  Patient is able to carry her infant with lumbar pain decreased </= 75%.  Baseline:  Goal status: INITIAL    PLAN:  PT FREQUENCY: 1-2x/week  PT DURATION: 12 weeks  PLANNED INTERVENTIONS: Therapeutic exercises, Therapeutic activity, Neuromuscular re-education, Patient/Family education, Joint mobilization, Dry Needling, Electrical stimulation, Spinal mobilization, Cryotherapy, Moist heat, Taping, Ultrasound, Biofeedback, and Manual therapy  PLAN FOR NEXT SESSION: manual work to pelvic floor, tape up diastasis, pelvic floor drop, hip stretches for gluteal, manual work to -c-section scar, abdominal contraction, gluteal contraction,    Eulis Foster, PT 04/14/23 9:26 AM

## 2023-04-16 ENCOUNTER — Encounter: Payer: Self-pay | Admitting: Physical Therapy

## 2023-04-16 ENCOUNTER — Ambulatory Visit: Admitting: Physical Therapy

## 2023-04-16 DIAGNOSIS — M6281 Muscle weakness (generalized): Secondary | ICD-10-CM | POA: Diagnosis not present

## 2023-04-16 DIAGNOSIS — R252 Cramp and spasm: Secondary | ICD-10-CM

## 2023-04-16 DIAGNOSIS — M5459 Other low back pain: Secondary | ICD-10-CM

## 2023-04-16 DIAGNOSIS — R1084 Generalized abdominal pain: Secondary | ICD-10-CM

## 2023-04-16 DIAGNOSIS — R102 Pelvic and perineal pain: Secondary | ICD-10-CM

## 2023-04-16 NOTE — Therapy (Signed)
OUTPATIENT PHYSICAL THERAPY FEMALE PELVIC TREATMENT   Patient Name: Morgan Boyd MRN: [id number] DOB:01-16-1986, 37 y.o., female Today's Date: 04/16/2023  END OF SESSION:  PT End of Session - 04/16/23 1447     Visit Number 8    Date for PT Re-Evaluation 05/30/23    Authorization Type [employer]    PT Start Time 1445    PT Stop Time 1525    PT Time Calculation (min) 40 min    Activity Tolerance Patient tolerated treatment well    Behavior During Therapy WFL for tasks assessed/performed             Past Medical History:  Diagnosis Date   Allergy    Autoimmune urticaria    Bilateral ovarian cysts    Bronchitis    Fibroid    Gestational hypertension    Vitamin D deficiency    Past Surgical History:  Procedure Laterality Date   CESAREAN SECTION N/A 05/14/2021   Procedure: CESAREAN SECTION;  Surgeon: Hoover Browns, MD;  Location: [facility] OR;  Service: Obstetrics;  Laterality: N/A;   CESAREAN SECTION N/A 01/06/2023   Procedure: CESAREAN SECTION;  Surgeon: Hoover Browns, MD;  Location: [facility] LD ORS;  Service: Obstetrics;  Laterality: N/A;   DILATION AND CURETTAGE OF UTERUS  10/21/2008   IUD REMOVAL     TONSILLECTOMY     Patient Active Problem List   Diagnosis Date Noted   Elevated blood pressure reading without diagnosis of hypertension 01/08/2023   Status post repeat low transverse cesarean section 01/06/2023   History of prior pregnancy with IUGR newborn 12/23/2022   History of severe pre-eclampsia 12/23/2022   Palpitations 12/23/2022   Maternal obesity affecting pregnancy, antepartum--BMI 46 12/23/2022   Delivery by classical cesarean section 05/14/2021   Fibroids 04/23/2021   Vitamin D deficiency 04/23/2021   AMA (advanced maternal age) multigravida 35+ 04/20/2021    PCP: Chyrel Masson  REFERRING PROVIDER:  Hands, Kylie, NP     REFERRING DIAG: N39.3 (ICD-10-CM) - Stress incontinence (female) (female)   THERAPY DIAG:  Muscle weakness (generalized)  Other  low back pain  Generalized abdominal pain  Pelvic pain  Cramp and spasm  Rationale for Evaluation and Treatment: Rehabilitation  ONSET DATE: 1/24  SUBJECTIVE:                                                                                                                                                                                           SUBJECTIVE STATEMENT: The belly binder helps the back pain. Has intercourse and was a little painful.      PAIN:  Are you having pain? Yes  NPRS scale: 2/10 Pain location: c-section  Pain type: burning Pain description: intermittent   Aggravating factors: lifting, rolling in bed Relieving factors: rest PAIN:  Are you having pain? Yes: NPRS scale: 2/10 Pain location: low back Pain description: intermittent, achy Aggravating factors: carrying infant with wrap, on feet prolonged time Relieving factors: stretch  PRECAUTIONS: None  WEIGHT BEARING RESTRICTIONS: No  FALLS:  Has patient fallen in last 6 months? No  LIVING ENVIRONMENT: Lives with: lives with their family   OCCUPATION: stay home mom  PLOF: Independent  PATIENT GOALS: reduce leakage, go through the day without leakage  PERTINENT HISTORY:  Cesarean section x2; history of fibroid and ovarian cysts   BOWEL MOVEMENT:no issues   URINATION: Pain with urination: No Fully empty bladder: Yes:   Stream: Strong, feels like a fire hose Urgency: Yes:   Frequency: average Leakage: Urge to void, Walking to the bathroom, Coughing, Sneezing, and Laughing Pads: Yes: sometimes   INTERCOURSE: not active at this time  PREGNANCY: Vaginal deliveries 2 Tearing Yes: second degree tear  C-section deliveries 2 Currently pregnant No  PROLAPSE: None   OBJECTIVE:   DIAGNOSTIC FINDINGS:  none   COGNITION: Overall cognitive status: Within functional limits for tasks assessed      POSTURE: rounded shoulders, forward head, and increased lumbar lordosis  PELVIC  ALIGNMENT: left ilium rotated posteriorly  LUMBARAROM/PROM:  A/PROM A/PROM  eval 04/02/23  Flexion Decreased by 25% full  Extension Decreased by 25% full  Right lateral flexion Decreased by 25% full  Left lateral flexion Decreased by 25% full  Right rotation Decreased by 50% Decreased by 25%  Left rotation Decreased by 50% Decreased by 25%   (Blank rows = not tested)  LOWER EXTREMITY ROM: bilateral hip ROM is full   LOWER EXTREMITY MMT:  MMT Right eval Left eval  Hip flexion 4/5 4/5  Hip extension 4/5 4/5  Hip abduction 3/5 3/5  Hip adduction 4/5 4/5   PALPATION:   General  tenderness throughout the abdomen, lumbar paraspinals; decreased movement of T8-L5                External Perineal Exam dryness                             Internal Pelvic Floor tenderness located on the levator ani, obturator internist, sides of the urethra and bladder, decreased movement of the anterior cervix  Patient confirms identification and approves PT to assess internal pelvic floor and treatment Yes  PELVIC MMT:   MMT eval 04/02/23 04/09/23 04/14/23  Vaginal 2/5 2/5 with a weak lift 3/5   Diastasis Recti 6 fingers width above and below with no tension    3 fingers above the umbilicus with good resistance  (Blank rows = not tested)       TONE: increased  PROLAPSE: Anterior wall above the introitus  TODAY'S TREATMENT:  04/16/23 Manual: Soft tissue mobilization: To assess for dry needling Manual work to the lumbar paraspinals to lengthen after dry needling Trigger Point Dry-Needling  Treatment instructions: Expect mild to moderate muscle soreness. S/S of pneumothorax if dry needled over a lung field, and to seek immediate medical attention should they occur. Patient verbalized understanding of these instructions and education.  Patient Consent Given: Yes Education handout provided: Previously provided Muscles treated: bilateral lumbar multifidi Electrical stimulation performed:  No Parameters: N/A Treatment response/outcome: elongation of muscle and trigger point response Exercises: Strengthening: Quadruped moving forward  and back then side  to side with resistance and core engaged Standing holding baby with core engaged moving side to side Mini squat to chair holding baby with core engaged Side step holding baby with core engaged both ways Walk backward holding baby with core engaged Sitting heel raises with pelvic floor contraction and baby on her lap for resistance Sitting and flex elbows bringing baby in the air with core engaged   04/14/23 Exercises: Strengthening:(pelvic floor and core engagement with all) Knee fall out with yellow band 10 x each leg  Mini march in supine with yellow band around knees 20 x Dead bug with yellow band around knees 20 x  Supine lower trunk rotation with ball between knees 20 x with tactile cues to the obliques Quadruped engage core and move trunk side to side and forward and back 04/09/23 Manual: Internal pelvic floor techniques: No emotional/communication barriers or cognitive limitation. Patient is motivated to learn. Patient understands and agrees with treatment goals and plan. PT explains patient will be examined in standing, sitting, and lying down to see how their muscles and joints work. When they are ready, they will be asked to remove their underwear so PT can examine their perineum. The patient is also given the option of providing their own chaperone as one is not provided in our facility. The patient also has the right and is explained the right to defer or refuse any part of the evaluation or treatment including the internal exam. With the patient's consent, PT will use one gloved finger to gently assess the muscles of the pelvic floor, seeing how well it contracts and relaxes and if there is muscle symmetry. After, the patient will get dressed and PT and patient will discuss exam findings and plan of care. PT and  patient discuss plan of care, schedule, attendance policy and HEP activities.  Working on the left pelvic floor including pubovaginalis, levator ani, obturator internist, left side of urethra and perineal body Neuromuscular re-education: Pelvic floor contraction training: Supine therapist finger in the vaginal canal giving tactile cues for pelvic floor contraction  Sitting pelvic floor contraction with tactile cues to abdomen to work on lift Educated patient on vaginal lubricants to use for the health of the vaginal tissue and reduce friction with penile penetration  04/02/23 Manual: Taping Kinesiotape to correct diastasis Recti wth overlapping diagonals Myofascial release: Fascial release of the urogenital diaphragm Release of the perineal body and superior transverse Internal pelvic floor techniques: No emotional/communication barriers or cognitive limitation. Patient is motivated to learn. Patient understands and agrees with treatment goals and plan. PT explains patient will be examined in standing, sitting, and lying down to see how their muscles and joints work. When they are ready, they will be asked to remove their underwear so PT can examine their perineum. The patient is also given the option of providing their own chaperone as one is not provided in our facility. The patient also has the right and is explained the right to defer or refuse any part of the evaluation or treatment including the internal exam. With the patient's consent, PT will use one gloved finger to gently assess the muscles of the pelvic floor, seeing how well it contracts and relaxes and if there is muscle symmetry. After, the patient will get dressed and PT and patient will discuss exam findings and plan of care. PT and patient discuss plan of care, schedule, attendance policy and HEP activities.  Going through the vaginal canal working on  the right levator ani with right hip movement to release the muscles.   Neuromuscular re-education: Core retraining: Transverse abdominus contraction with ball squeeze and tactile cues to lower abdomen Supine ball squeeze mini march with abdominal contraction Supine with ball squeeze moving knees side to side with abdominal contraction.                                                                       PATIENT EDUCATION: 04/14/23 Education details: [id number], information on dry needling Person educated: Patient Education method: Explanation, Demonstration, Tactile cues, Verbal cues, and Handouts Education comprehension: verbalized understanding, returned demonstration, verbal cues required, tactile cues required, and needs further education   HOME EXERCISE PROGRAM: 04/14/23 Access Code: [email or web address] URL: [email or web address] Date: 04/14/2023 Prepared by: Eulis Foster  Exercises - - Hooklying Small March  - 1 x daily - 7 x weekly - 2 sets - 10 reps - Hooklying Isometric Clamshell  - 1 x daily - 7 x weekly - 2 sets - 10 reps - Dead Bug  - 1 x daily - 3 x weekly - 1 sets - 10 reps - Supine Lower Trunk Rotation  - 1 x daily - 3 x weekly - 1 sets - 10 reps - Quadruped Forward Weight Shift  - 1 x daily - 3 x weekly - 1 sets - 10 reps - Quadruped Side to Side Weight Shifts  - 1 x daily - 3 x weekly - 1 sets - 10 reps   ASSESSMENT:  CLINICAL IMPRESSION: Patient is a 37 y.o. female  who was seen today for physical therapy  treatment for stress incontinence.  Patient had reduction of back pain after manual work. She did not dome her abdomen with exercise due to improve core contraction. She reports her pelvic floor does fatigue easily. She was able to keep spinal neutral with her exercises. Patient would benefit from skilled therapy to improve strength and coordination while reducing pain and urinary leakage.   OBJECTIVE IMPAIRMENTS: decreased activity tolerance, decreased coordination, decreased endurance, decreased ROM, decreased strength,  increased fascial restrictions, increased muscle spasms, and pain.   ACTIVITY LIMITATIONS: carrying, lifting, standing, transfers, continence, and caring for others  PARTICIPATION LIMITATIONS: meal prep, cleaning, laundry, driving, shopping, and community activity  PERSONAL FACTORS: Time since onset of injury/illness/exacerbation and 1-2 comorbidities: Cesarean section x2; history of fibroid and ovarian cysts  are also affecting patient's functional outcome.   REHAB POTENTIAL: Excellent  CLINICAL DECISION MAKING: Evolving/moderate complexity  EVALUATION COMPLEXITY: Moderate   GOALS: Goals reviewed with patient? Yes  SHORT TERM GOALS: Target date: 04/04/23  Patient independent with initial HEP for pelvic floor and core.  Baseline: Goal status: Met 04/09/23  2.  Patient is able to perform a circular contraction with the pelvic floor and slight lift.  Baseline:  Goal status: Met 04/02/23  3.  Minimal tenderness in the pelvic floor muscles due to reduction of trigger points.  Baseline:  Goal status: INITIAL  4.  Patient has increased lumbar ROM by 25% due to improved tissue and joint mobility.  Baseline:  Goal status: Met 04/02/23   LONG TERM GOALS: Target date: 05/30/23  Patient independent with advanced HEP for core, pelvic and hip strength.  Baseline:  Goal status: INITIAL  2.  Patient pelvic floor strength >/= 3/5 so her urinary leakage is </- 75% better.  Baseline:  Goal status: INITIAL  3.  Patient diastasis recti reduce </= 3 finger width with good tension.  Baseline:  Goal status: INITIAL  4.  Low back pain decreased >/= 75% due to increased mobility and reduction of trigger points.  Baseline:  Goal status: INITIAL  5.  Patient is able to carry her infant with lumbar pain decreased </= 75%.  Baseline:  Goal status: INITIAL    PLAN:  PT FREQUENCY: 1-2x/week  PT DURATION: 12 weeks  PLANNED INTERVENTIONS: Therapeutic exercises, Therapeutic activity,  Neuromuscular re-education, Patient/Family education, Joint mobilization, Dry Needling, Electrical stimulation, Spinal mobilization, Cryotherapy, Moist heat, Taping, Ultrasound, Biofeedback, and Manual therapy  PLAN FOR NEXT SESSION: manual work to pelvic floor, pelvic floor drop,  manual work to -c-section scar, abdominal contraction, gluteal contraction,    Eulis Foster, PT 04/16/23 3:24 PM

## 2023-04-23 ENCOUNTER — Ambulatory Visit (INDEPENDENT_AMBULATORY_CARE_PROVIDER_SITE_OTHER): Admitting: *Deleted

## 2023-04-23 ENCOUNTER — Ambulatory Visit: Admitting: Physical Therapy

## 2023-04-23 DIAGNOSIS — L501 Idiopathic urticaria: Secondary | ICD-10-CM

## 2023-05-07 ENCOUNTER — Ambulatory Visit: Admitting: Cardiology

## 2023-05-22 ENCOUNTER — Ambulatory Visit (INDEPENDENT_AMBULATORY_CARE_PROVIDER_SITE_OTHER)

## 2023-05-22 DIAGNOSIS — L501 Idiopathic urticaria: Secondary | ICD-10-CM

## 2023-05-23 ENCOUNTER — Ambulatory Visit: Attending: Obstetrics and Gynecology | Admitting: Physical Therapy

## 2023-05-23 ENCOUNTER — Encounter: Payer: Self-pay | Admitting: Physical Therapy

## 2023-05-23 DIAGNOSIS — R252 Cramp and spasm: Secondary | ICD-10-CM | POA: Diagnosis present

## 2023-05-23 DIAGNOSIS — R1084 Generalized abdominal pain: Secondary | ICD-10-CM | POA: Insufficient documentation

## 2023-05-23 DIAGNOSIS — R102 Pelvic and perineal pain: Secondary | ICD-10-CM | POA: Insufficient documentation

## 2023-05-23 DIAGNOSIS — M5459 Other low back pain: Secondary | ICD-10-CM | POA: Diagnosis present

## 2023-05-23 DIAGNOSIS — M6281 Muscle weakness (generalized): Secondary | ICD-10-CM | POA: Insufficient documentation

## 2023-05-23 NOTE — Therapy (Addendum)
OUTPATIENT PHYSICAL THERAPY FEMALE PELVIC TREATMENT   Patient Name: Morgan Boyd MRN: [id number] DOB:[birth year], 37 y.o., female Today's Date: 05/23/2023  END OF SESSION:  PT End of Session - 05/23/23 1108     Visit Number 9    Date for PT Re-Evaluation 08/22/23    Authorization Type [employer]    PT Start Time 1105    PT Stop Time 1145    PT Time Calculation (min) 40 min    Activity Tolerance Patient tolerated treatment well    Behavior During Therapy WFL for tasks assessed/performed             Past Medical History:  Diagnosis Date   Allergy    Autoimmune urticaria    Bilateral ovarian cysts    Bronchitis    Fibroid    Gestational hypertension    Vitamin D deficiency    Past Surgical History:  Procedure Laterality Date   CESAREAN SECTION N/A 05/14/2021   Procedure: CESAREAN SECTION;  Surgeon: Hoover Browns, MD;  Location: [facility] OR;  Service: Obstetrics;  Laterality: N/A;   CESAREAN SECTION N/A 01/06/2023   Procedure: CESAREAN SECTION;  Surgeon: Hoover Browns, MD;  Location: [facility] LD ORS;  Service: Obstetrics;  Laterality: N/A;   DILATION AND CURETTAGE OF UTERUS  10/21/2008   IUD REMOVAL     TONSILLECTOMY     Patient Active Problem List   Diagnosis Date Noted   Elevated blood pressure reading without diagnosis of hypertension 01/08/2023   Status post repeat low transverse cesarean section 01/06/2023   History of prior pregnancy with IUGR newborn 12/23/2022   History of severe pre-eclampsia 12/23/2022   Palpitations 12/23/2022   Maternal obesity affecting pregnancy, antepartum--BMI 46 12/23/2022   Delivery by classical cesarean section 05/14/2021   Fibroids 04/23/2021   Vitamin D deficiency 04/23/2021   AMA (advanced maternal age) multigravida 35+ 04/20/2021    PCP: Chyrel Masson  REFERRING PROVIDER:  Hands, Kylie, NP     REFERRING DIAG: N39.3 (ICD-10-CM) - Stress incontinence (female) (female)   THERAPY DIAG:  Muscle weakness (generalized)  Other  low back pain  Generalized abdominal pain  Pelvic pain  Cramp and spasm  Rationale for Evaluation and Treatment: Rehabilitation  ONSET DATE: 1/24  SUBJECTIVE:                                                                                                                                                                                           SUBJECTIVE STATEMENT: The belly binder helps the back pain. Has intercourse and was a little painful.      PAIN:  Are you having pain? Yes  NPRS scale: 2/10 Pain location: c-section  Pain type: burning Pain description: intermittent   Aggravating factors: lifting, rolling in bed Relieving factors: rest PAIN:  Are you having pain? Yes: NPRS scale: 2/10 Pain location: low back Pain description: intermittent, achy Aggravating factors: carrying infant with wrap, on feet prolonged time Relieving factors: stretch  PRECAUTIONS: None  WEIGHT BEARING RESTRICTIONS: No  FALLS:  Has patient fallen in last 6 months? No  LIVING ENVIRONMENT: Lives with: lives with their family   OCCUPATION: stay home mom  PLOF: Independent  PATIENT GOALS: reduce leakage, go through the day without leakage  PERTINENT HISTORY:  Cesarean section x2; history of fibroid and ovarian cysts   BOWEL MOVEMENT:no issues   URINATION: Pain with urination: No Fully empty bladder: Yes:   Stream: Strong, feels like a fire hose Urgency: Yes:   Frequency: average Leakage: Urge to void, Walking to the bathroom, Coughing, Sneezing, and Laughing Pads: Yes: sometimes   INTERCOURSE: not active at this time  PREGNANCY: Vaginal deliveries 2 Tearing Yes: second degree tear  C-section deliveries 2 Currently pregnant No  PROLAPSE: None   OBJECTIVE:   DIAGNOSTIC FINDINGS:  none   COGNITION: Overall cognitive status: Within functional limits for tasks assessed      POSTURE: rounded shoulders, forward head, and increased lumbar lordosis  PELVIC  ALIGNMENT: left ilium rotated posteriorly  LUMBARAROM/PROM:  A/PROM A/PROM  eval 04/02/23  Flexion Decreased by 25% full  Extension Decreased by 25% full  Right lateral flexion Decreased by 25% full  Left lateral flexion Decreased by 25% full  Right rotation Decreased by 50% Decreased by 25%  Left rotation Decreased by 50% Decreased by 25%   (Blank rows = not tested)  LOWER EXTREMITY ROM: bilateral hip ROM is full   LOWER EXTREMITY MMT:  MMT Right eval Left eval Right 05/22/24 Left  05/23/23  Hip flexion 4/5 4/5 5/5 5/5  Hip extension 4/5 4/5 4/5 4/5  Hip abduction 3/5 3/5 3/5 3+/5  Hip adduction 4/5 4/5 5/5 4+/5   PALPATION:   General  tenderness throughout the abdomen, lumbar paraspinals; decreased movement of T8-L5                External Perineal Exam dryness                             Internal Pelvic Floor tenderness located on the levator ani, obturator internist, sides of the urethra and bladder, decreased movement of the anterior cervix  Patient confirms identification and approves PT to assess internal pelvic floor and treatment Yes  PELVIC MMT:   MMT eval 04/02/23 04/09/23 04/14/23 05/23/23  Vaginal 2/5 2/5 with a weak lift 3/5  4/5  Diastasis Recti 6 fingers width above and below with no tension    3 fingers above the umbilicus with good resistance 4 finger width with minimal resistance  (Blank rows = not tested)       TONE: increased  PROLAPSE: Anterior wall above the introitus  TODAY'S TREATMENT:  05/23/23 Manual: Kinesiotape Kinesiotape to the abdomen to reduce the diastasis recti Internal pelvic floor techniques: No emotional/communication barriers or cognitive limitation. Patient is motivated to learn. Patient understands and agrees with treatment goals and plan. PT explains patient will be examined in standing, sitting, and lying down to see how their muscles and joints work. When they are ready, they will be asked to remove their underwear so PT can  examine their perineum.  The patient is also given the option of providing their own chaperone as one is not provided in our facility. The patient also has the right and is explained the right to defer or refuse any part of the evaluation or treatment including the internal exam. With the patient's consent, PT will use one gloved finger to gently assess the muscles of the pelvic floor, seeing how well it contracts and relaxes and if there is muscle symmetry. After, the patient will get dressed and PT and patient will discuss exam findings and plan of care. PT and patient discuss plan of care, schedule, attendance policy and HEP activities.  Going through the vaginal wall working on introitus, levator ani, and obturator internist to elongate the muscles  Neuromuscular re-education: Pelvic floor contraction training: Therapist finger in the vaginal canal working on pelvic floor contraction    04/16/23 Manual: Soft tissue mobilization: To assess for dry needling Manual work to the lumbar paraspinals to lengthen after dry needling Trigger Point Dry-Needling  Treatment instructions: Expect mild to moderate muscle soreness. S/S of pneumothorax if dry needled over a lung field, and to seek immediate medical attention should they occur. Patient verbalized understanding of these instructions and education.  Patient Consent Given: Yes Education handout provided: Previously provided Muscles treated: bilateral lumbar multifidi Electrical stimulation performed: No Parameters: N/A Treatment response/outcome: elongation of muscle and trigger point response Exercises: Strengthening: Quadruped moving forward and back then side  to side with resistance and core engaged Standing holding baby with core engaged moving side to side Mini squat to chair holding baby with core engaged Side step holding baby with core engaged both ways Walk backward holding baby with core engaged Sitting heel raises with pelvic  floor contraction and baby on her lap for resistance Sitting and flex elbows bringing baby in the air with core engaged   04/14/23 Exercises: Strengthening:(pelvic floor and core engagement with all) Knee fall out with yellow band 10 x each leg  Mini march in supine with yellow band around knees 20 x Dead bug with yellow band around knees 20 x  Supine lower trunk rotation with ball between knees 20 x with tactile cues to the obliques Quadruped engage core and move trunk side to side and forward and back                                                                      PATIENT EDUCATION: 04/14/23 Education details: [id number], information on dry needling Person educated: Patient Education method: Explanation, Demonstration, Tactile cues, Verbal cues, and Handouts Education comprehension: verbalized understanding, returned demonstration, verbal cues required, tactile cues required, and needs further education   HOME EXERCISE PROGRAM: 04/14/23 Access Code: [email or web address] URL: [email or web address] Date: 04/14/2023 Prepared by: Eulis Foster  Exercises - - Hooklying Small March  - 1 x daily - 7 x weekly - 2 sets - 10 reps - Hooklying Isometric Clamshell  - 1 x daily - 7 x weekly - 2 sets - 10 reps - Dead Bug  - 1 x daily - 3 x weekly - 1 sets - 10 reps - Supine Lower Trunk Rotation  - 1 x daily - 3 x weekly - 1 sets - 10 reps - Quadruped Forward Weight Shift  -  1 x daily - 3 x weekly - 1 sets - 10 reps - Quadruped Side to Side Weight Shifts  - 1 x daily - 3 x weekly - 1 sets - 10 reps   ASSESSMENT:  CLINICAL IMPRESSION: Patient is a 37 y.o. female  who was seen today for physical therapy  treatment for stress incontinence.  Urinary leakage is 70% better. Back pain is 35% betterPatient had reduction of back pain after manual work. Diastasis Recti is 4 fingers width with minimal resistance. . She reports her pelvic floor does fatigue easily. She was able to keep spinal neutral  with her exercises. She has increased strength of pelvic floor to 4/5 and minimal tenderness in the muscles. Patient continues to have weakness in her hips, abdomen and core.  Patient would benefit from skilled therapy to improve strength and coordination while reducing pain and urinary leakage.   OBJECTIVE IMPAIRMENTS: decreased activity tolerance, decreased coordination, decreased endurance, decreased ROM, decreased strength, increased fascial restrictions, increased muscle spasms, and pain.   ACTIVITY LIMITATIONS: carrying, lifting, standing, transfers, continence, and caring for others  PARTICIPATION LIMITATIONS: meal prep, cleaning, laundry, driving, shopping, and community activity  PERSONAL FACTORS: Time since onset of injury/illness/exacerbation and 1-2 comorbidities: Cesarean section x2; history of fibroid and ovarian cysts  are also affecting patient's functional outcome.   REHAB POTENTIAL: Excellent  CLINICAL DECISION MAKING: Evolving/moderate complexity  EVALUATION COMPLEXITY: Moderate   GOALS: Goals reviewed with patient? Yes  SHORT TERM GOALS: Target date: 04/04/23  Patient independent with initial HEP for pelvic floor and core.  Baseline: Goal status: Met 04/09/23  2.  Patient is able to perform a circular contraction with the pelvic floor and slight lift.  Baseline:  Goal status: Met 04/02/23  3.  Minimal tenderness in the pelvic floor muscles due to reduction of trigger points.  Baseline:  Goal status: INITIAL  4.  Patient has increased lumbar ROM by 25% due to improved tissue and joint mobility.  Baseline:  Goal status: Met 04/02/23   LONG TERM GOALS: Target date: 08/22/23 Patient independent with advanced HEP for core, pelvic and hip strength.  Baseline:  Goal status: ongoing 05/23/23  2.  Patient pelvic floor strength >/= 3/5 so her urinary leakage is </- 75% better.  Baseline: Pelvic floor strength 4/5 Goal status: ongoing 05/23/23  3.  Patient diastasis  recti reduce </= 3 finger width with good tension.  Baseline:  Goal status: INITIAL  4.  Low back pain decreased >/= 75% due to increased mobility and reduction of trigger points.  Baseline: back pain is 35% better.  Goal status: ongoing 05/23/23  5.  Patient is able to carry her infant with lumbar pain decreased </= 75%.  Baseline:  Goal status: Ongoing 05/23/23    PLAN:  PT FREQUENCY: 1-2x/week  PT DURATION: 12 weeks  PLANNED INTERVENTIONS: Therapeutic exercises, Therapeutic activity, Neuromuscular re-education, Patient/Family education, Joint mobilization, Dry Needling, Electrical stimulation, Spinal mobilization, Cryotherapy, Moist heat, Taping, Ultrasound, Biofeedback, and Manual therapy  PLAN FOR NEXT SESSION: manual work to pelvic floor, pelvic floor drop,  manual work to -c-section scar, abdominal contraction, gluteal contraction,    Eulis Foster, PT 05/23/23 11:53 AM  PHYSICAL THERAPY DISCHARGE SUMMARY  Visits from Start of Care: 9  Current functional level related to goals / functional outcomes: See above.    Remaining deficits: See above. Unable to assess patient or formal discharge.    Education / Equipment: HEP   Patient agrees to discharge. Patient goals were not  met. Patient is being discharged due to not returning since the last visit. Thank you for the referral.   Eulis Foster, PT 11/20/23 1:59 PM

## 2023-06-19 ENCOUNTER — Ambulatory Visit

## 2023-07-18 ENCOUNTER — Ambulatory Visit: Payer: Self-pay

## 2023-07-18 ENCOUNTER — Ambulatory Visit (INDEPENDENT_AMBULATORY_CARE_PROVIDER_SITE_OTHER)

## 2023-07-18 DIAGNOSIS — L501 Idiopathic urticaria: Secondary | ICD-10-CM

## 2023-08-19 ENCOUNTER — Ambulatory Visit

## 2023-08-29 ENCOUNTER — Ambulatory Visit

## 2023-09-05 ENCOUNTER — Ambulatory Visit (INDEPENDENT_AMBULATORY_CARE_PROVIDER_SITE_OTHER): Admitting: *Deleted

## 2023-09-05 DIAGNOSIS — L501 Idiopathic urticaria: Secondary | ICD-10-CM

## 2023-10-03 ENCOUNTER — Ambulatory Visit: Admitting: *Deleted

## 2023-10-03 DIAGNOSIS — L501 Idiopathic urticaria: Secondary | ICD-10-CM | POA: Diagnosis not present

## 2023-10-31 ENCOUNTER — Ambulatory Visit

## 2023-10-31 ENCOUNTER — Ambulatory Visit (INDEPENDENT_AMBULATORY_CARE_PROVIDER_SITE_OTHER): Admitting: *Deleted

## 2023-10-31 DIAGNOSIS — L501 Idiopathic urticaria: Secondary | ICD-10-CM

## 2023-11-28 ENCOUNTER — Ambulatory Visit

## 2023-12-22 ENCOUNTER — Ambulatory Visit: Admitting: *Deleted

## 2023-12-22 DIAGNOSIS — L501 Idiopathic urticaria: Secondary | ICD-10-CM | POA: Diagnosis not present

## 2023-12-22 MED ORDER — EPINEPHRINE 0.3 MG/0.3ML IJ SOAJ: 0.3000 mg | INTRAMUSCULAR | 1 refills | Status: DC | PRN

## 2024-01-19 ENCOUNTER — Ambulatory Visit

## 2024-01-20 ENCOUNTER — Ambulatory Visit

## 2024-01-20 DIAGNOSIS — L501 Idiopathic urticaria: Secondary | ICD-10-CM | POA: Diagnosis not present

## 2024-02-03 ENCOUNTER — Other Ambulatory Visit (HOSPITAL_COMMUNITY): Payer: Self-pay

## 2024-02-17 ENCOUNTER — Ambulatory Visit (INDEPENDENT_AMBULATORY_CARE_PROVIDER_SITE_OTHER)

## 2024-02-17 DIAGNOSIS — L501 Idiopathic urticaria: Secondary | ICD-10-CM | POA: Diagnosis not present

## 2024-02-26 ENCOUNTER — Ambulatory Visit: Admitting: Allergy & Immunology

## 2024-03-08 ENCOUNTER — Other Ambulatory Visit: Payer: Self-pay | Admitting: Allergy & Immunology

## 2024-03-18 ENCOUNTER — Ambulatory Visit

## 2024-03-18 NOTE — Progress Notes (Unsigned)
 Follow Up Note  RE: Morgan Boyd MRN: [id number] DOB: 1986/10/05 Date of Office Visit: 03/19/2024  Referring provider: Hermenia Loose Primary care provider: Virl Grimes, PA-C  Chief Complaint: Urticaria and Follow-up (Hive has been pretty well controlled.)  History of Present Illness: I had the pleasure of seeing Morgan Boyd for a follow up visit at the [facility] on 03/19/2024. She is a 38 y.o. female, who is being followed for urticaria on Xolair . Her previous allergy office visit was on 02/27/2023 with Dr. Idolina Maker. Today is a regular follow up visit.  Discussed the use of AI scribe software for clinical note transcription with the patient, who gave verbal consent to proceed.    She has been receiving Xolair  injections for several years to manage her chronic hives, with a dosage of 300 mg every four weeks. The injections effectively control her hives, itching, and rash. Any delay in her injection schedule, even by a few days, results in a breakout. Stress can also trigger flare-ups, although they are not as severe as they would be without the injections. Without Xolair , she experiences hives daily.  She uses Zyrtec on an as-needed basis when she feels a 'tingle' in her skin, indicating an impending flare-up. She also has an Epipen  that is up to date and an albuterol  inhaler, which she has not used in years.   During her pregnancy, which ended in March of last year, she continued Xolair  treatment and did not notice any significant worsening of her hives, except possibly towards the end due to stress.     Assessment and Plan: Morgan Boyd is a 38 y.o. female with: Chronic idiopathic urticaria Well-controlled with Xolair  300 mg every four weeks and noticed mild symptoms when late or due for injections. Flare-ups less severe with treatment. No adverse reactions. Discussed long-term safety of Xolair , noting no significant long-term adverse effects.   Continue Xolair  300mg  every 4 weeks - given today. Avoid the following potential triggers: alcohol, tight clothing, NSAIDs, hot showers and getting overheated. Continue proper skin care.  During a flare may take the following medications:  Start zyrtec (cetirizine) 10mg  OR allegra (fexofenadine) 180mg  twice a day. If symptoms are not controlled or causes drowsiness let us  know. Start Pepcid (famotidine) 20mg  twice a day.   Return in about 6 months (around 09/19/2024).  No orders of the defined types were placed in this encounter.  Lab Orders  No laboratory test(s) ordered today    Diagnostics: None.   Medication List:  Current Outpatient Medications  Medication Sig Dispense Refill   albuterol  (VENTOLIN  HFA) 108 (90 Base) MCG/ACT inhaler Inhale 2 puffs into the lungs every 6 (six) hours as needed for wheezing or shortness of breath. 18 g 1   cetirizine (ZYRTEC) 10 MG tablet Take 10 mg by mouth daily as needed for allergies or rhinitis.     EPINEPHrine  (EPIPEN  2-PAK) 0.3 mg/0.3 mL IJ SOAJ injection Inject 0.3 mg into the muscle as needed for anaphylaxis. 0.3 mL 1   XOLAIR  150 MG injection INJECT 300 MG UNDER THE SKIN EVERY 28 DAYS (MAY REQUIRE RECONSTITUTION/DILUTION REFER TO PACKAGE INSERT OR MEDICAL ORDER) 2 each 11   Current Facility-Administered Medications  Medication Dose Route Frequency Provider Last Rate Last Admin   omalizumab  (XOLAIR ) injection 300 mg  300 mg Subcutaneous Q28 days Gallagher, Joel Louis, MD   300 mg at 03/19/24 1113   Allergies: Allergies  Allergen Reactions   Latex Itching and Rash  Sodium Bicarbonate Dermatitis, Itching and Rash   Sulfa Antibiotics Palpitations    Faint, dizzy, shaking   I reviewed her past medical history, social history, family history, and environmental history and no significant changes have been reported from her previous visit.  Review of Systems  Constitutional:  Negative for appetite change, chills, fever and unexpected  weight change.  HENT:  Negative for congestion and rhinorrhea.   Eyes:  Negative for itching.  Respiratory:  Negative for cough, chest tightness, shortness of breath and wheezing.   Cardiovascular:  Negative for chest pain.  Gastrointestinal:  Negative for abdominal pain.  Genitourinary:  Negative for difficulty urinating.  Skin:  Negative for rash.  Neurological:  Negative for headaches.    Objective: BP 104/76 (BP Location: Left Arm, Patient Position: Sitting, Cuff Size: Large)   Pulse 92   Temp 98.1 F (36.7 C) (Temporal)   Wt 241 lb 14.4 oz (109.7 kg)   SpO2 97%   BMI 42.85 kg/m  Body mass index is 42.85 kg/m. Physical Exam Vitals and nursing note reviewed.  Constitutional:      Appearance: Normal appearance. She is well-developed.  HENT:     Head: Normocephalic and atraumatic.     Right Ear: Tympanic membrane and external ear normal.     Left Ear: Tympanic membrane and external ear normal.     Nose: Nose normal.     Mouth/Throat:     Mouth: Mucous membranes are moist.     Pharynx: Oropharynx is clear.  Eyes:     Conjunctiva/sclera: Conjunctivae normal.  Cardiovascular:     Rate and Rhythm: Normal rate and regular rhythm.     Heart sounds: Normal heart sounds. No murmur heard.    No friction rub. No gallop.  Pulmonary:     Effort: Pulmonary effort is normal.     Breath sounds: Normal breath sounds. No wheezing, rhonchi or rales.  Musculoskeletal:     Cervical back: Neck supple.  Skin:    General: Skin is warm.     Findings: No rash.  Neurological:     Mental Status: She is alert and oriented to person, place, and time.  Psychiatric:        Behavior: Behavior normal.   Previous notes and tests were reviewed. The plan was reviewed with the patient/family, and all questions/concerned were addressed.  It was my pleasure to see Morgan Boyd today and participate in her care. Please feel free to contact me with any questions or concerns.  Sincerely,  Eudelia Hero,  DO Allergy & Immunology  [facility]  [facility] office: [phone] [facility] office: [phone]

## 2024-03-19 ENCOUNTER — Ambulatory Visit

## 2024-03-19 ENCOUNTER — Encounter: Payer: Self-pay | Admitting: Allergy

## 2024-03-19 ENCOUNTER — Ambulatory Visit: Admitting: Allergy

## 2024-03-19 ENCOUNTER — Other Ambulatory Visit: Payer: Self-pay

## 2024-03-19 VITALS — BP 104/76 | HR 92 | Temp 98.1°F | Wt 241.9 lb

## 2024-03-19 DIAGNOSIS — L501 Idiopathic urticaria: Secondary | ICD-10-CM | POA: Diagnosis not present

## 2024-03-19 NOTE — Patient Instructions (Addendum)
 Hives Continue Xolair  300mg  every 4 weeks - given today. Avoid the following potential triggers: alcohol, tight clothing, NSAIDs, hot showers and getting overheated. Continue proper skin care.   During a flare may take the following medications:  Start zyrtec (cetirizine) 10mg  OR allegra (fexofenadine) 180mg  twice a day. If symptoms are not controlled or causes drowsiness let us  know. Start Pepcid (famotidine) 20mg  twice a day.   Return in about 6 months (around 09/19/2024). Or sooner if needed.

## 2024-04-16 ENCOUNTER — Ambulatory Visit (INDEPENDENT_AMBULATORY_CARE_PROVIDER_SITE_OTHER)

## 2024-04-16 DIAGNOSIS — L501 Idiopathic urticaria: Secondary | ICD-10-CM | POA: Diagnosis not present

## 2024-05-17 ENCOUNTER — Ambulatory Visit

## 2024-05-18 ENCOUNTER — Ambulatory Visit (INDEPENDENT_AMBULATORY_CARE_PROVIDER_SITE_OTHER)

## 2024-05-18 ENCOUNTER — Ambulatory Visit

## 2024-05-18 DIAGNOSIS — L501 Idiopathic urticaria: Secondary | ICD-10-CM

## 2024-06-15 ENCOUNTER — Ambulatory Visit (INDEPENDENT_AMBULATORY_CARE_PROVIDER_SITE_OTHER)

## 2024-06-15 DIAGNOSIS — L501 Idiopathic urticaria: Secondary | ICD-10-CM

## 2024-07-14 ENCOUNTER — Ambulatory Visit

## 2024-07-14 DIAGNOSIS — L501 Idiopathic urticaria: Secondary | ICD-10-CM | POA: Diagnosis not present

## 2024-08-11 ENCOUNTER — Ambulatory Visit

## 2024-08-11 DIAGNOSIS — L508 Other urticaria: Secondary | ICD-10-CM

## 2024-08-11 DIAGNOSIS — L501 Idiopathic urticaria: Secondary | ICD-10-CM

## 2024-09-08 ENCOUNTER — Ambulatory Visit

## 2024-09-09 ENCOUNTER — Ambulatory Visit (INDEPENDENT_AMBULATORY_CARE_PROVIDER_SITE_OTHER)

## 2024-09-09 DIAGNOSIS — L501 Idiopathic urticaria: Secondary | ICD-10-CM

## 2024-09-12 NOTE — Progress Notes (Unsigned)
 Follow Up Note  RE: Morgan Boyd MRN: [id number] DOB: 1986-07-04 Date of Office Visit: 09/13/2024  Referring provider: Tammy Tari Boyd DEVONNA Primary care provider: Tammy Tari ONEIDA, PA-C  Chief Complaint: No chief complaint on file.  History of Present Illness: I had the pleasure of seeing Morgan Boyd for a follow up visit at the [facility] on 09/13/2024. She is a 38 y.o. female, who is being followed for CIU on Xolair . Her previous allergy office visit was on 03/19/2024 with Dr. Luke. Today is a regular follow up visit.  Discussed the use of AI scribe software for clinical note transcription with the patient, who gave verbal consent to proceed.  History of Present Illness            ***  Assessment and Plan: Morgan Boyd is a 38 y.o. female with: Chronic idiopathic urticaria Well-controlled with Xolair  300 mg every four weeks and noticed mild symptoms when late or due for injections. Flare-ups less severe with treatment. No adverse reactions. Discussed long-term safety of Xolair , noting no significant long-term adverse effects.  Continue Xolair  300mg  every 4 weeks - given today. Avoid the following potential triggers: alcohol, tight clothing, NSAIDs, hot showers and getting overheated. Continue proper skin care.  During a flare may take the following medications:  Start zyrtec (cetirizine) 10mg  OR allegra (fexofenadine) 180mg  twice a day. If symptoms are not controlled or causes drowsiness let us  know. Start Pepcid (famotidine) 20mg  twice a day.    Return in about 6 months (around 09/19/2024). Assessment and Plan              No follow-ups on file.  No orders of the defined types were placed in this encounter.  Lab Orders  No laboratory test(s) ordered today    Diagnostics: Spirometry:  Tracings reviewed. Her effort: {Blank single:19197::Good reproducible efforts.,It was hard to get consistent efforts and there is a question as to  whether this reflects a maximal maneuver.,Poor effort, data can not be interpreted.} FVC: ***L FEV1: ***L, ***% predicted FEV1/FVC ratio: ***% Interpretation: {Blank single:19197::Spirometry consistent with mild obstructive disease,Spirometry consistent with moderate obstructive disease,Spirometry consistent with severe obstructive disease,Spirometry consistent with possible restrictive disease,Spirometry consistent with mixed obstructive and restrictive disease,Spirometry uninterpretable due to technique,Spirometry consistent with normal pattern,No overt abnormalities noted given today's efforts}.  Please see scanned spirometry results for details.  Skin Testing: {Blank single:19197::Select foods,Environmental allergy panel,Environmental allergy panel and select foods,Food allergy panel,None,Deferred due to recent antihistamines use}. *** Results discussed with patient/family.   Medication List:  Current Outpatient Medications  Medication Sig Dispense Refill   albuterol  (VENTOLIN  HFA) 108 (90 Base) MCG/ACT inhaler Inhale 2 puffs into the lungs every 6 (six) hours as needed for wheezing or shortness of breath. 18 g 1   cetirizine (ZYRTEC) 10 MG tablet Take 10 mg by mouth daily as needed for allergies or rhinitis.     EPINEPHrine  (EPIPEN  2-PAK) 0.3 mg/0.3 mL IJ SOAJ injection Inject 0.3 mg into the muscle as needed for anaphylaxis. 0.3 mL 1   XOLAIR  150 MG injection INJECT 300 MG UNDER THE SKIN EVERY 28 DAYS (MAY REQUIRE RECONSTITUTION/DILUTION REFER TO PACKAGE INSERT OR MEDICAL ORDER) 2 each 11   Current Facility-Administered Medications  Medication Dose Route Frequency Provider Last Rate Last Admin   omalizumab  (XOLAIR ) injection 300 mg  300 mg Subcutaneous Q28 days Gallagher, Joel Louis, MD   300 mg at 09/09/24 1439   Allergies: Allergies  Allergen Reactions   Latex Itching  and Rash   Sodium Bicarbonate Dermatitis, Itching and Rash   Sulfa Antibiotics  Palpitations    Faint, dizzy, shaking   I reviewed her past medical history, social history, family history, and environmental history and no significant changes have been reported from her previous visit.  Review of Systems  Constitutional:  Negative for appetite change, chills, fever and unexpected weight change.  HENT:  Negative for congestion and rhinorrhea.   Eyes:  Negative for itching.  Respiratory:  Negative for cough, chest tightness, shortness of breath and wheezing.   Cardiovascular:  Negative for chest pain.  Gastrointestinal:  Negative for abdominal pain.  Genitourinary:  Negative for difficulty urinating.  Skin:  Negative for rash.  Neurological:  Negative for headaches.    Objective: There were no vitals taken for this visit. There is no height or weight on file to calculate BMI. Physical Exam Vitals and nursing note reviewed.  Constitutional:      Appearance: Normal appearance. She is well-developed.  HENT:     Head: Normocephalic and atraumatic.     Right Ear: Tympanic membrane and external ear normal.     Left Ear: Tympanic membrane and external ear normal.     Nose: Nose normal.     Mouth/Throat:     Mouth: Mucous membranes are moist.     Pharynx: Oropharynx is clear.  Eyes:     Conjunctiva/sclera: Conjunctivae normal.  Cardiovascular:     Rate and Rhythm: Normal rate and regular rhythm.     Heart sounds: Normal heart sounds. No murmur heard.    No friction rub. No gallop.  Pulmonary:     Effort: Pulmonary effort is normal.     Breath sounds: Normal breath sounds. No wheezing, rhonchi or rales.  Musculoskeletal:     Cervical back: Neck supple.  Skin:    General: Skin is warm.     Findings: No rash.  Neurological:     Mental Status: She is alert and oriented to person, place, and time.  Psychiatric:        Behavior: Behavior normal.    Previous notes and tests were reviewed. The plan was reviewed with the patient/family, and all  questions/concerned were addressed.  It was my pleasure to see Morgan Boyd today and participate in her care. Please feel free to contact me with any questions or concerns.  Sincerely,  Orlan Cramp, DO Allergy & Immunology  [facility]  [place] office: [phone] [facility] office: [phone]

## 2024-09-13 ENCOUNTER — Ambulatory Visit (INDEPENDENT_AMBULATORY_CARE_PROVIDER_SITE_OTHER): Admitting: Allergy

## 2024-09-13 ENCOUNTER — Encounter: Payer: Self-pay | Admitting: Allergy

## 2024-09-13 VITALS — BP 118/76 | HR 97 | Temp 98.0°F | Ht 63.0 in | Wt 236.9 lb

## 2024-09-13 DIAGNOSIS — L501 Idiopathic urticaria: Secondary | ICD-10-CM

## 2024-09-13 MED ORDER — EPINEPHRINE 0.3 MG/0.3ML IJ SOAJ: 0.3000 mg | INTRAMUSCULAR | 1 refills | Status: AC | PRN

## 2024-09-13 NOTE — Patient Instructions (Addendum)
 Hives Will try to move Xolair  300mg  to every 3 weeks.  Tammy will be in touch with you.  Avoid the following potential triggers: alcohol, tight clothing, NSAIDs, hot showers and getting overheated. Continue proper skin care.   During a flare may take the following medications:  Start zyrtec (cetirizine) 10mg  OR allegra (fexofenadine) 180mg  twice a day. If symptoms are not controlled or causes drowsiness let us  know. Start Pepcid (famotidine) 20mg  twice a day.   Return in about 6 months (around 03/13/2025). Or sooner if needed.

## 2024-10-07 ENCOUNTER — Ambulatory Visit

## 2024-10-07 DIAGNOSIS — L501 Idiopathic urticaria: Secondary | ICD-10-CM | POA: Diagnosis not present

## 2024-10-08 ENCOUNTER — Telehealth: Payer: Self-pay | Admitting: *Deleted

## 2024-10-08 NOTE — Telephone Encounter (Signed)
 Patient Ins has denied increase Xolair . [employer] is one of those that wont increase even with appeal

## 2024-10-08 NOTE — Telephone Encounter (Signed)
-----   Message from Orlan Cramp, DO sent at 09/13/2024 10:51 AM EST ----- Please see if insurance will allow Xolair  300mg  every 3 weeks. Patient is having breakthrough hives 1 week before due for injection. Thank you.

## 2024-11-05 ENCOUNTER — Ambulatory Visit (INDEPENDENT_AMBULATORY_CARE_PROVIDER_SITE_OTHER)

## 2024-11-05 DIAGNOSIS — L508 Other urticaria: Secondary | ICD-10-CM

## 2024-11-05 DIAGNOSIS — L501 Idiopathic urticaria: Secondary | ICD-10-CM

## 2024-12-03 ENCOUNTER — Ambulatory Visit

## 2025-03-16 ENCOUNTER — Ambulatory Visit: Admitting: Allergy
